# Patient Record
Sex: Female | Born: 1980 | Race: White | Hispanic: No | Smoking: Never smoker
Health system: Southern US, Community
[De-identification: ages and names within clinical notes are randomized; demographics above are authoritative.]

## PROBLEM LIST (undated history)

## (undated) ENCOUNTER — Emergency Department (HOSPITAL_COMMUNITY): Payer: Self-pay | Source: Home / Self Care

## (undated) DIAGNOSIS — G8929 Other chronic pain: Secondary | ICD-10-CM

## (undated) DIAGNOSIS — F329 Major depressive disorder, single episode, unspecified: Secondary | ICD-10-CM

## (undated) DIAGNOSIS — Z87442 Personal history of urinary calculi: Secondary | ICD-10-CM

## (undated) DIAGNOSIS — Z170 Estrogen receptor positive status [ER+]: Secondary | ICD-10-CM

## (undated) DIAGNOSIS — J45909 Unspecified asthma, uncomplicated: Secondary | ICD-10-CM

## (undated) DIAGNOSIS — F32A Depression, unspecified: Secondary | ICD-10-CM

## (undated) DIAGNOSIS — R519 Headache, unspecified: Secondary | ICD-10-CM

## (undated) DIAGNOSIS — K8689 Other specified diseases of pancreas: Secondary | ICD-10-CM

## (undated) DIAGNOSIS — M5432 Sciatica, left side: Secondary | ICD-10-CM

## (undated) DIAGNOSIS — F419 Anxiety disorder, unspecified: Secondary | ICD-10-CM

## (undated) DIAGNOSIS — Z973 Presence of spectacles and contact lenses: Secondary | ICD-10-CM

## (undated) DIAGNOSIS — D649 Anemia, unspecified: Secondary | ICD-10-CM

## (undated) DIAGNOSIS — Z8489 Family history of other specified conditions: Secondary | ICD-10-CM

## (undated) DIAGNOSIS — G4700 Insomnia, unspecified: Secondary | ICD-10-CM

## (undated) DIAGNOSIS — I10 Essential (primary) hypertension: Secondary | ICD-10-CM

## (undated) DIAGNOSIS — C50411 Malignant neoplasm of upper-outer quadrant of right female breast: Secondary | ICD-10-CM

## (undated) DIAGNOSIS — M549 Dorsalgia, unspecified: Secondary | ICD-10-CM

## (undated) DIAGNOSIS — Z803 Family history of malignant neoplasm of breast: Secondary | ICD-10-CM

## (undated) DIAGNOSIS — Z8052 Family history of malignant neoplasm of bladder: Secondary | ICD-10-CM

## (undated) DIAGNOSIS — T8859XA Other complications of anesthesia, initial encounter: Secondary | ICD-10-CM

## (undated) DIAGNOSIS — Z800 Family history of malignant neoplasm of digestive organs: Secondary | ICD-10-CM

## (undated) DIAGNOSIS — K760 Fatty (change of) liver, not elsewhere classified: Secondary | ICD-10-CM

## (undated) HISTORY — PX: ABDOMINAL HYSTERECTOMY: SHX81

## (undated) HISTORY — DX: Family history of malignant neoplasm of digestive organs: Z80.0

## (undated) HISTORY — PX: CHOLECYSTECTOMY: SHX55

## (undated) HISTORY — DX: Family history of malignant neoplasm of bladder: Z80.52

## (undated) HISTORY — PX: APPENDECTOMY: SHX54

## (undated) HISTORY — PX: OTHER SURGICAL HISTORY: SHX169

## (undated) HISTORY — PX: WISDOM TOOTH EXTRACTION: SHX21

## (undated) HISTORY — PX: DILATION AND CURETTAGE OF UTERUS: SHX78

## (undated) HISTORY — DX: Family history of malignant neoplasm of breast: Z80.3

## (undated) MED FILL — Goserelin Acetate Implant 3.6 MG: SUBCUTANEOUS | Qty: 3.6 | Status: AC

---
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
## 2001-08-29 ENCOUNTER — Other Ambulatory Visit: Admission: RE | Admit: 2001-08-29 | Discharge: 2001-08-29 | Payer: Self-pay | Admitting: Family Medicine

## 2001-10-17 HISTORY — PX: FINGER SURGERY: SHX640

## 2001-11-04 ENCOUNTER — Encounter: Payer: Self-pay | Admitting: Emergency Medicine

## 2001-11-04 ENCOUNTER — Inpatient Hospital Stay (HOSPITAL_COMMUNITY): Admission: AC | Admit: 2001-11-04 | Discharge: 2001-11-05 | Payer: Self-pay

## 2004-02-06 ENCOUNTER — Other Ambulatory Visit: Payer: Self-pay

## 2004-08-01 ENCOUNTER — Emergency Department: Payer: Self-pay | Admitting: Emergency Medicine

## 2004-08-05 ENCOUNTER — Observation Stay (HOSPITAL_COMMUNITY): Admission: RE | Admit: 2004-08-05 | Discharge: 2004-08-06 | Payer: Self-pay | Admitting: Gynecology

## 2004-08-06 ENCOUNTER — Encounter (INDEPENDENT_AMBULATORY_CARE_PROVIDER_SITE_OTHER): Payer: Self-pay | Admitting: *Deleted

## 2004-08-29 ENCOUNTER — Emergency Department: Payer: Self-pay | Admitting: Emergency Medicine

## 2004-11-10 ENCOUNTER — Emergency Department: Payer: Self-pay | Admitting: Emergency Medicine

## 2004-11-16 ENCOUNTER — Ambulatory Visit: Payer: Self-pay

## 2004-11-17 ENCOUNTER — Ambulatory Visit: Payer: Self-pay | Admitting: Emergency Medicine

## 2005-02-14 ENCOUNTER — Emergency Department: Payer: Self-pay | Admitting: Emergency Medicine

## 2005-05-03 ENCOUNTER — Emergency Department: Payer: Self-pay | Admitting: Emergency Medicine

## 2005-05-13 ENCOUNTER — Ambulatory Visit (HOSPITAL_COMMUNITY): Admission: RE | Admit: 2005-05-13 | Discharge: 2005-05-13 | Payer: Self-pay | Admitting: Gynecology

## 2005-05-13 ENCOUNTER — Encounter (INDEPENDENT_AMBULATORY_CARE_PROVIDER_SITE_OTHER): Payer: Self-pay | Admitting: Specialist

## 2005-05-23 ENCOUNTER — Inpatient Hospital Stay (HOSPITAL_COMMUNITY): Admission: AD | Admit: 2005-05-23 | Discharge: 2005-05-26 | Payer: Self-pay | Admitting: Gynecology

## 2005-06-07 ENCOUNTER — Encounter (INDEPENDENT_AMBULATORY_CARE_PROVIDER_SITE_OTHER): Payer: Self-pay | Admitting: Specialist

## 2005-06-07 ENCOUNTER — Inpatient Hospital Stay (HOSPITAL_COMMUNITY): Admission: RE | Admit: 2005-06-07 | Discharge: 2005-06-10 | Payer: Self-pay | Admitting: Gynecology

## 2005-06-23 ENCOUNTER — Ambulatory Visit (HOSPITAL_COMMUNITY): Admission: RE | Admit: 2005-06-23 | Discharge: 2005-06-23 | Payer: Self-pay | Admitting: Gynecology

## 2005-09-27 ENCOUNTER — Ambulatory Visit (HOSPITAL_COMMUNITY): Admission: RE | Admit: 2005-09-27 | Discharge: 2005-09-27 | Payer: Self-pay | Admitting: Gynecology

## 2005-12-22 ENCOUNTER — Ambulatory Visit: Payer: Self-pay | Admitting: Obstetrics & Gynecology

## 2005-12-29 ENCOUNTER — Ambulatory Visit: Payer: Self-pay | Admitting: Obstetrics & Gynecology

## 2005-12-29 ENCOUNTER — Inpatient Hospital Stay (HOSPITAL_COMMUNITY): Admission: AD | Admit: 2005-12-29 | Discharge: 2005-12-29 | Payer: Self-pay | Admitting: Obstetrics & Gynecology

## 2006-01-05 ENCOUNTER — Ambulatory Visit: Payer: Self-pay | Admitting: Obstetrics & Gynecology

## 2006-01-05 ENCOUNTER — Inpatient Hospital Stay (HOSPITAL_COMMUNITY): Admission: AD | Admit: 2006-01-05 | Discharge: 2006-01-05 | Payer: Self-pay | Admitting: Family Medicine

## 2006-01-12 ENCOUNTER — Ambulatory Visit: Payer: Self-pay | Admitting: Obstetrics & Gynecology

## 2006-01-12 ENCOUNTER — Inpatient Hospital Stay (HOSPITAL_COMMUNITY): Admission: AD | Admit: 2006-01-12 | Discharge: 2006-01-12 | Payer: Self-pay | Admitting: Family Medicine

## 2006-01-12 ENCOUNTER — Ambulatory Visit: Payer: Self-pay | Admitting: Obstetrics and Gynecology

## 2006-01-14 ENCOUNTER — Ambulatory Visit: Payer: Self-pay | Admitting: Family Medicine

## 2006-01-14 ENCOUNTER — Inpatient Hospital Stay (HOSPITAL_COMMUNITY): Admission: AD | Admit: 2006-01-14 | Discharge: 2006-01-14 | Payer: Self-pay | Admitting: Gynecology

## 2006-01-19 ENCOUNTER — Ambulatory Visit: Payer: Self-pay | Admitting: Family Medicine

## 2006-01-26 ENCOUNTER — Ambulatory Visit: Payer: Self-pay | Admitting: Family Medicine

## 2006-01-27 ENCOUNTER — Ambulatory Visit (HOSPITAL_COMMUNITY): Admission: RE | Admit: 2006-01-27 | Discharge: 2006-01-27 | Payer: Self-pay | Admitting: Obstetrics & Gynecology

## 2006-01-27 ENCOUNTER — Ambulatory Visit: Payer: Self-pay | Admitting: Obstetrics & Gynecology

## 2006-01-28 ENCOUNTER — Ambulatory Visit: Payer: Self-pay | Admitting: Certified Nurse Midwife

## 2006-01-28 ENCOUNTER — Inpatient Hospital Stay (HOSPITAL_COMMUNITY): Admission: AD | Admit: 2006-01-28 | Discharge: 2006-01-28 | Payer: Self-pay | Admitting: Obstetrics and Gynecology

## 2006-01-30 ENCOUNTER — Ambulatory Visit: Payer: Self-pay | Admitting: Certified Nurse Midwife

## 2006-01-30 ENCOUNTER — Observation Stay (HOSPITAL_COMMUNITY): Admission: AD | Admit: 2006-01-30 | Discharge: 2006-01-31 | Payer: Self-pay | Admitting: Family Medicine

## 2006-02-02 ENCOUNTER — Ambulatory Visit: Payer: Self-pay | Admitting: Obstetrics & Gynecology

## 2006-02-09 ENCOUNTER — Ambulatory Visit: Payer: Self-pay | Admitting: Obstetrics & Gynecology

## 2006-02-09 ENCOUNTER — Inpatient Hospital Stay (HOSPITAL_COMMUNITY): Admission: AD | Admit: 2006-02-09 | Discharge: 2006-02-12 | Payer: Self-pay | Admitting: Obstetrics & Gynecology

## 2006-02-10 ENCOUNTER — Encounter (INDEPENDENT_AMBULATORY_CARE_PROVIDER_SITE_OTHER): Payer: Self-pay | Admitting: Specialist

## 2006-02-10 ENCOUNTER — Ambulatory Visit: Payer: Self-pay | Admitting: Obstetrics & Gynecology

## 2006-03-23 ENCOUNTER — Ambulatory Visit: Payer: Self-pay | Admitting: Obstetrics & Gynecology

## 2006-07-16 ENCOUNTER — Other Ambulatory Visit: Payer: Self-pay

## 2006-07-16 ENCOUNTER — Emergency Department: Payer: Self-pay | Admitting: Unknown Physician Specialty

## 2006-07-18 ENCOUNTER — Ambulatory Visit: Payer: Self-pay | Admitting: Family Medicine

## 2006-08-03 ENCOUNTER — Ambulatory Visit: Payer: Self-pay | Admitting: Obstetrics & Gynecology

## 2006-08-03 ENCOUNTER — Encounter: Payer: Self-pay | Admitting: Obstetrics & Gynecology

## 2006-08-23 ENCOUNTER — Ambulatory Visit: Payer: Self-pay | Admitting: *Deleted

## 2007-03-13 ENCOUNTER — Ambulatory Visit: Payer: Self-pay | Admitting: Family Medicine

## 2007-04-17 ENCOUNTER — Ambulatory Visit: Payer: Self-pay | Admitting: Family Medicine

## 2007-08-03 ENCOUNTER — Ambulatory Visit: Payer: Self-pay | Admitting: Family Medicine

## 2007-08-09 ENCOUNTER — Ambulatory Visit: Payer: Self-pay | Admitting: Obstetrics & Gynecology

## 2007-08-09 ENCOUNTER — Encounter: Payer: Self-pay | Admitting: Obstetrics & Gynecology

## 2007-08-16 ENCOUNTER — Ambulatory Visit: Payer: Self-pay | Admitting: Obstetrics & Gynecology

## 2007-08-21 ENCOUNTER — Ambulatory Visit (HOSPITAL_COMMUNITY): Admission: RE | Admit: 2007-08-21 | Discharge: 2007-08-21 | Payer: Self-pay | Admitting: Obstetrics & Gynecology

## 2007-08-21 ENCOUNTER — Ambulatory Visit: Payer: Self-pay | Admitting: Obstetrics & Gynecology

## 2007-09-26 ENCOUNTER — Ambulatory Visit: Payer: Self-pay | Admitting: Obstetrics & Gynecology

## 2007-11-27 ENCOUNTER — Inpatient Hospital Stay (HOSPITAL_COMMUNITY): Admission: RE | Admit: 2007-11-27 | Discharge: 2007-11-28 | Payer: Self-pay | Admitting: Obstetrics & Gynecology

## 2007-11-27 ENCOUNTER — Ambulatory Visit: Payer: Self-pay | Admitting: Obstetrics & Gynecology

## 2007-11-27 ENCOUNTER — Encounter: Payer: Self-pay | Admitting: Obstetrics & Gynecology

## 2007-12-12 ENCOUNTER — Ambulatory Visit: Payer: Self-pay | Admitting: Obstetrics & Gynecology

## 2008-05-05 ENCOUNTER — Inpatient Hospital Stay: Payer: Self-pay | Admitting: Unknown Physician Specialty

## 2008-06-27 ENCOUNTER — Emergency Department: Payer: Self-pay | Admitting: Internal Medicine

## 2010-01-15 ENCOUNTER — Ambulatory Visit: Payer: Self-pay | Admitting: Family Medicine

## 2011-03-01 NOTE — Discharge Summary (Signed)
[name], [name]             ACCOUNT NO.:  [id number]   MEDICAL RECORD NO.:  [id number]          PATIENT TYPE:  INP   LOCATION:  9317                          FACILITY:  [facility]   PHYSICIAN:  Norton Blizzard, MD    DATE OF BIRTH:  1981/08/05   DATE OF ADMISSION:  11/27/2007  DATE OF DISCHARGE:  11/28/2007                               DISCHARGE SUMMARY   ADMISSION DIAGNOSES:  1. Chronic pelvic pain.  2. Endometriosis.  3. Menometrorrhagia.   DISCHARGE DIAGNOSES:  1. Status post total abdominal hysterectomy.  2. Right salpingo-oophorectomy.   PERTINENT LABS:  Preoperative hemoglobin was 14.  Postoperative  hemoglobin was 10, which was stable when remeasured during postoperative  day #1.   HOSPITAL COURSE:  The patient is a 30 year old para 2, who has a long  history of chronic pelvic pain, endometriosis and menometrorrhagia.  The  patient had been treated with different modalities in the past,  including endometrial ablation, which did not alleviate her symptoms.  She desired definitive surgical management.  She underwent a total  abdominal hysterectomy, right salpingo-oophorectomy on November 27, 2007.  For further details of this procedure, please refer to separate  dictated operative report.  The procedure was largely uncomplicated, and  the patient underwent routine postoperative advances.  By postoperative  day #1 she had a stable hemoglobin.  She was ambulating without  difficulty, tolerating a regular diet.  Pain was controlled on oral pain  medications, and she was passing flatus.  The patient showed no signs or  symptoms or anemia or other postoperative complications, and desired to  be discharged to home.  She was then discharged to home in stable  condition.   DISCHARGE INSTRUCTIONS:  Please call the clinic at [facility] or come  back to the emergency room for any fevers, chills, sweats, abnormal  bleeding, abnormal incisional drainage, incisional redness, or  any other  concerns.  The patient can take showers and she was also to increase  activity slowly.  No lifting anything heavier than 10 pounds for the  next 6 weeks, and nothing in vagina for the next 6 weeks.  The patient  is to follow up at [facility] in 2 weeks for her postoperative  evaluation, and she is to come to the emergency room or call [facility] clinic for any concerns prior to this appointment.      Norton Blizzard, MD  Electronically Signed     UAD/MEDQ  D:  11/28/2007  T:  11/29/2007  Job:  [id number]

## 2011-03-01 NOTE — Assessment & Plan Note (Signed)
NAMEEARLIE, [name]             ACCOUNT NO.:  [id number]   MEDICAL RECORD NO.:  [id number]          PATIENT TYPE:  POB   LOCATION:  [facility] at [facility]         FACILITY:  [facility]   PHYSICIAN:  Tinnie Gens, MD        DATE OF BIRTH:  02-Mar-1981   DATE OF SERVICE:  03/13/2007                                  CLINIC NOTE   CHIEF COMPLAINT:  Abnormal bleeding.   HISTORY OF PRESENT ILLNESS:  The patient is a 30 year old who is  approximately one year post-partum who has had some ongoing problems  with hypertension, tachycardia. She also had what looked to be some mild  hyperthyroidism on labs done here in October. She states that she has  had followup to these labs by her primary care doctor as well as an  endocrinologist that were both normal, probably last time it was checked  was in January of this year. Approximately six months ago, she started  getting a cycle back. She had a cycle every 2 to 3 months for several  months until this month when she had two cycles. First one lasted 8 days  with heavy amount of bleeding and then had another cycle toward the  latter part of the month approximately two weeks later that lasted 6  days with heavy bleeding around that cycle as well. She reports this is  not unlike after the birth of her previous child and she had abnormal  cycles and had to be put on birth control pills to control that. She  otherwise is without complaints. She continues to nurse her baby, but  only at night.   PHYSICAL EXAMINATION:  Her pulse is 107, her blood pressure is 133/86,  weight is 197, which is up from 190 which is up from 185 from the visit  prior to that. The patient is well-developed, well-nourished female in  no acute distress. She has had too much sun recently.  ABDOMEN: Soft and nontender, nondistended.  GU: Normal external female genitalia. Vagina is pink and rugated. Cervix  is nulliparous without lesions. Uterus is small, anteverted and no  adnexal  masses or tenderness noted.   IMPRESSION:  Menorrhagia, unclear etiology.   PLAN:  1. Check TSH, CBC.  2. Pelvic sonogram.  3. Start Orthostat 1/35 may start today. The patient is status post      BTL in the past so ensuring no pregnancy is not an issue. Will see      her back in approximately six weeks with review of all information      and see how the birth control is maintaining her cycle. Have      additionally given her a prescription for Naprosyn. Next yearly is      due in October of 2008.           ______________________________  Tinnie Gens, MD     TP/MEDQ  D:  03/13/2007  T:  03/13/2007  Job:  [id number]

## 2011-03-01 NOTE — Assessment & Plan Note (Signed)
NAMEHILMA, [name]             ACCOUNT NO.:  [id number]   MEDICAL RECORD NO.:  [id number]          PATIENT TYPE:  POB   LOCATION:  [facility] at [facility]         FACILITY:  [facility]   PHYSICIAN:  Elsie Lincoln, MD      DATE OF BIRTH:  1981-09-26   DATE OF SERVICE:                                  CLINIC NOTE   This is a 30 year old female who presents for endometrial biopsy.  She  has dysfunctional uterine bleeding that is unresponsive to OCPs and has  pelvic pain.  She is scheduled for endometrial ablation to hopefully  control her dysfunctional uterine bleeding and hopefully control some of  the cramping she experiences with it.  She is for endometrial biopsy  today.  She is also complaining of vaginal discharge.  GC and chlamydia  culture and wet prep were also sent.  The uterus sounded to 9 cm.  Also  of note, she had a negative UCG prior to the procedure.  Also informed  consent was obtained prior to the procedure.  Again, the uterus sounded  to 9 cm, and two passes were taken.  We talked about the ablation and  what will take place on August 21, 2007 when she has her procedure.  All questions were answered.  She is to continue her birth control pills  up until then.  She was given a pack of Femcon FE samples, and then she  was also given a prescription for Darvocet for pain.  If this is not  working, the next step would be hysterectomy.           ______________________________  Elsie Lincoln, MD     KL/MEDQ  D:  08/16/2007  T:  08/16/2007  Job:  [id number]

## 2011-03-01 NOTE — Assessment & Plan Note (Signed)
NAMEMOON, BUDDE             ACCOUNT NO.:  [id number]   MEDICAL RECORD NO.:  [id number]          PATIENT TYPE:  POB   LOCATION:  [facility]         FACILITY:  [facility]   PHYSICIAN:  Johnella Moloney, MD        DATE OF BIRTH:  01-Dec-1980   DATE OF SERVICE:  09/26/2007                                  CLINIC NOTE   HISTORY OF PRESENT ILLNESS:  The patient is a 30 year old para 2 with  long history of menometrorrhagia, endometriosis and pelvic pain who was  status post hydrotherm endometrial ablation on August 21, 2007, who  presents for followup.  The patient has had a long history of  menometrorrhagia, unresponsive to oral contraceptive pills.  She has  also had multiple laparoscopies in the past and was diagnosed with  endometriosis.  The patient reports that she had wanted to have a  hysterectomy for many years, but was told to try other medical  modalities or less invasive surgery to see if this could help with her  menometrorrhagia and her pain.  At this visit, patient reports that  since her hydrotherm ablation, which was done by Dr. Penne Lash on November  4, she had no bleeding for 2 weeks, but then had a heavy period for 1  week during which she was using 6 or more super plus tampons, and since  then she has had bleeding off and on, ranging from spotting to very  heavy bleeding.  The patient also notes that she has been having yellow-  orange discharge that is malodorous since endometrial ablation, and does  report persistent cramping that has not gotten better since the  procedure.  Today, the patient is requesting total abdominal  hysterectomy and a bilateral salpingo-oophorectomy.  She says that she  has tried these minimal invasive surgeries, and they are not working for  her, and she cannot live with this pain any longer, and wants definitive  surgical management.   PAST MEDICAL HISTORY:  The patient has a history of tachycardia and high  blood pressure for which  she is being followed by a cardiologist.  She  also has history of depression and chronic pelvic pain.   PAST SURGICAL HISTORY:  The patient has had 2 Caesarean sections in the  past.  The last Caesarean section, also, bilateral tubal ligation was  performed at the same time.  She has had laparoscopies in the past, and  D&Cs for her menorrhagia, and her last surgery was the hydrotherm  ablation and hysteroscopy performed by Dr. Penne Lash on November 4.   MEDICATIONS:  The patient is currently taking Darvocet as needed,  multivitamin, Toprol 1 tablet daily, and Unisom as needed for sleep.   ALLERGIES:  LATEX and PENICILLIN.  The patient reports a history of  hives with PENICILLIN and LATEX.   SOCIAL HISTORY:  The patient lives with her children.  She denies any  abuse, denies any current habits.   FAMILY HISTORY:  Noncontributory.   REVIEW OF SYSTEMS:  The patient endorses chronic pelvic pain and  menometrorrhagia.  She also notes that she is unable to sleep some  nights which is helped by her Unisom.  PHYSICAL EXAMINATION:  VITALS:  Pulse 86, blood pressure 123/90, weight  194.5 pounds, height 5 feet 6 inches.  IN GENERAL:  No apparent distress.  LUNGS:  Clear to auscultation bilaterally.  HEART:  Regular rate and rhythm.  ABDOMEN:  Nontender and nondistended.  Well-healed Pfannenstiel incision  and laparoscopy incision scars.  PELVIC EXAMINATION:  Normal external female genitalia.  Pink, well-  rugated vagina with thick yellow discharge noted in the vaginal vault.  No active discharge noted from the cervix.  Wet prep and GC chlamydia  cultures were obtained.  No lesions noted.  No cervical motion  tenderness or uterine tenderness, bilateral adnexal tenderness palpated.  No masses palpated.   ASSESSMENT AND PLAN:  The patient is a 30 year old para 2 who has a long  history of menometrorrhagia and pelvic pain.  The patient desires  definitive surgical management.  I talked to  the patient about risks of  total abdominal hysterectomy, bilateral salpingo-oophorectomy, including  the risks of surgery or bleeding, infection, injury to surrounding  organs, and need for additional procedures.  Also talked to the patient  about being menopausal at such a young age, and the effect on her bone  health, and the need for taking hormone replacement therapy, and the  risks of hormone replacement therapy.  The patient just continues to say  that she cannot live with this pain any longer, and she wants everything  taken out.  The patient does not want to try any further medications or  any other surgical procedures apart from a definite and surgical  procedure.  She will be booked for a total abdominal hysterectomy,  bilateral salpingo-oophorectomy.  This surgery will be done with Dr.  Elsie Lincoln who the patient has been seeing more recently.  I wanted  her to be involved in the surgery.  Given her prior history, she is not  a good candidate for laparoscopy, as there is a chance she will have  some adhesive disease given her prior surgeries, so she will be booked  for an open procedure.  The patient does verbalize understanding of the  plan, and will be contacted by a surgical scheduler for her surgery date  and preop instructions.  Given her history of tachycardia and  hypertension, the patient was told to follow up with cardiologist for  preoperative examination and surgical clearance.           ______________________________  Johnella Moloney, MD     UD/MEDQ  D:  09/26/2007  T:  09/26/2007  Job:  [id number]

## 2011-03-01 NOTE — Assessment & Plan Note (Signed)
[name], SIRIANNI             ACCOUNT NO.:  [id number]   MEDICAL RECORD NO.:  [id number]          PATIENT TYPE:  POB   LOCATION:  [facility] at [facility]         FACILITY:  [facility]   PHYSICIAN:  Elsie Lincoln, MD      DATE OF BIRTH:  July 23, 1981   DATE OF SERVICE:  08/09/2007                                  CLINIC NOTE   The patient is a 30 year old female who presents for her physical, also  follow-up of irregular bleeding.  She has been on Ortho-Cept for almost  seven packages that and she is still having breakthrough bleeding and  cramping.  She takes Darvocet p.r.n. Of note, the patient was schedule  for hysterectomy before her last pregnancy. She showed up for her  hysterectomy and was diagnosed as pregnant.  She had a second C-section  and then was placed on the Ortho-Cept.  She also has had a BTL.  She is  completely done with childbearing. She was told she had endometriosis.  In looking at pictures of her laparoscopy and it does not appear that  her pelvis is overly ridden with mass implants of endometriosis.  However, I was not present during the procedure.  She has also had PID  in the past. However, again I do not see obvious adhesions, I did do her  C-section and I do not remember there being a tremendous amount of  adhesions again.  At this point, I can say she has failed medical  management and I do not think she is a candidate for Depo given that she  already has irregular bleeding.  She has had transvaginal ultrasound  which revealed normal uterus and normal ovaries with small centimeter  simple cyst her ovaries. We talked about hysterectomy versus endometrial  ablation just to try to get rid of the bleeding which could hopefully  help her cramping. Do not think a total abdominal hysterectomy bilateral  salpingo oophorectomy would be prudent at this time given that she is so  young and removing her ovaries would many years of estrogenation. The  patient agrees that we  will try most conservative measures first with  just endometrial ablation and stopping the chronic bleeding and  hopefully and some of her cramping. Also of note, she has been having  some bowel issues and had 3 weeks of diarrhea and was placed on fiber  pills. Now, she has constipation.  She could have an irritable bowel  syndrome and again this needs to be carefully investigated before she  undergoes any major surgery.  She also has a problem with depression,  but this seems well controlled with Effexor. She claims she is very  tired. However, she does have a hemoglobin done this month as 13.7; her  TSH is also normal this month.   PAST MEDICAL HISTORY:  No changes.  However, she has had some borderline  hypertension issues. Her blood pressure today was 142/78 and recheck  128/81.   PAST SURGICAL HISTORY:  No changes.   GYN HISTORY:  As above.   REVIEW OF SYMPTOMS:  As above.   Her blood pressure 141/78, recheck 120/81, weight 193, height 47feet 6  inches, pulse 92.  MEDICATIONS:  1. Darvocet N 100 p.r.n.  2. multivitamin.  3. Toprol.  4. Effexor.  5. Unisom.  6. Ortho-Cept.   ALLERGIES:  None.   GENERAL:  Well-nourished, well-developed no apparent distress.  HEENT:  Normocephalic, atraumatic with braces on her teeth. Thyroid no  masses.  LUNGS:  Clear auscultation bilaterally.  HEART:  Regular rate and rhythm.  BREASTS:  No masses, no lymphadenopathy.  No nipple discharge or skin  changes.  ABDOMEN:  Soft, nontender.  No rebound, no guarding.  No organomegaly.  No hernia.  GENITALIA:  Tanner five.  There is some small folliculitis on her  buttocks. Vagina pink, normal rugae, bladder and urethra nontender.  Cervix nulliparous, closed.  Uterus anterior, very small adnexa, no  masses, nontender. EXTREMITIES:  Nontender.   ASSESSMENT/PLAN:  30 year old female with chronic pelvic pain of unknown  etiology and menometrorrhagia  that has failed  medical management.  1.  Pap smear and cultures today.  2. Endometrial biopsy, before endometrial ablation.  3. Come back next week for endometrial biopsy, premedicated with 600      mg of Motrin.  4. Prescription for Darvocet for pain.           ______________________________  Elsie Lincoln, MD     KL/MEDQ  D:  08/09/2007  T:  08/09/2007  Job:  [id number]

## 2011-03-01 NOTE — Op Note (Signed)
[name], [name]             ACCOUNT NO.:  [id number]   MEDICAL RECORD NO.:  [id number]          PATIENT TYPE:  AMB   LOCATION:  [facility]                           FACILITY:  [facility]   PHYSICIAN:  Lesly Dukes, M.D. DATE OF BIRTH:  Jun 22, 1981   DATE OF PROCEDURE:  09/20/2007  DATE OF DISCHARGE:  08/21/2007                               OPERATIVE REPORT   PREOPERATIVE DIAGNOSIS:  30 year old para 2 with metromenorrhagia,  unresponsive to medication.   POSTOPERATIVE DIAGNOSIS:  30 year old para 2 with metromenorrhagia,  unresponsive to medication.   OPERATION PERFORMED:  Hysteroscopy and hydrothermal ablation.   SURGEON:  Lesly Dukes, M.D.   ANESTHESIA:  General.   FINDINGS:  Thick endometrium preprocedure.   SPECIMENS:  Endometrial curettings to pathology.   ESTIMATED BLOOD LOSS:  Minimal.   COMPLICATIONS:  None.   DESCRIPTION OF PROCEDURE:  After informed consent was obtained, the  patient was taken to the operating room where general anesthesia was  found to be adequate.  The patient was placed in dorsal lithotomy  position, prepared and draped in normal sterile fashion.  The bladder  was emptied.  A bivalved speculum was placed in the patient's vagina and  the anterior lip of the cervix was grasped with a single toothed  tenaculum.  The cervix was gently dilated with  Hegar dilators to #8.  Gentle sharp curettage was performed for a specimen.  Hysteroscopy was  performed and then the hydrothermal ablation was performed following the  specifications of the manufacturer.  There was a 10 minute burning time  and then a cool down.  The patient tolerated the procedure well.  There  was no extravasation of fluid from the cervix.  The patient tolerated  the procedure well.  Sponge, lap, needle and instruments were all  correct times two.  Patient went to recovery room in stable condition.      Lesly Dukes, M.D.  Electronically Signed     KHL/MEDQ  D:   09/20/2007  T:  09/21/2007  Job:  [id number]

## 2011-03-01 NOTE — Op Note (Signed)
NAMETRISTIAN, [name]             ACCOUNT NO.:  [id number]   MEDICAL RECORD NO.:  [id number]          PATIENT TYPE:  INP   LOCATION:                                FACILITY:  [facility]   PHYSICIAN:  Norton Blizzard, MD    DATE OF BIRTH:  1981-01-26   DATE OF PROCEDURE:  11/28/2007  DATE OF DISCHARGE:                               OPERATIVE REPORT   PREOPERATIVE DIAGNOSIS:  Menometrorrhagia, endometriosis, chronic pelvic  pain.   POSTOPERATIVE DIAGNOSIS:  Menometrorrhagia, endometriosis, chronic  pelvic pain.   PROCEDURE:  Total abdominal hysterectomy, right salpingo-oophorectomy.   SURGEON:  Norton Blizzard, MD   ASSISTANT:  Javier Glazier. Okey Dupre, M.D.   ANESTHESIA:  General, and local anesthesia in the form of 0.25% Marcaine  10 cc injected in the incision at the end of surgery.   IV FLUIDS:  3100 cc of lactated Ringer's.   ESTIMATED BLOOD LOSS:  300 cc.   URINE OUTPUT:  200 cc of clear yellow urine at the end of the procedure.   INDICATION:  The patient is a 30 year old, para 2, with a long history  of menometrorrhagia, endometriosis, and chronic pelvic pain.  The  patient has had multiple laparoscopies in the past and had been  unresponsive to hormonal therapy.  She recently underwent hydrothermic  endometrial ablation on August 21, 2007, and at her follow up visit,  the patient said that this did not help her menometrorrhagia and her  pain and wanted definitive surgical management.  The patient was  counseled that given her multiple history of surgeries, including 2  cesarean sections, abdominal hysterectomy was recommended.  The patient  initially wanted a bilateral salpingo-oophorectomy.  However, when she  was counseled regarding the risk of early menopause and the risk of  osteoporosis, the patient did agree to undergo a unilateral salpingo-  oophorectomy in order to keep her endogenous hormone supply intact.  The  risks of surgery were explained to the patient, and written  informed  consent was obtained.   FINDINGS:  Small anteverted uterus, endometriotic lesions noted on the  right ovary, normal left adnexa, thick adhesions of bladder to the lower  uterine segment.   SPECIMENS:  Uterus, cervix, right fallopian tube and ovary.   DISPOSITION OF SPECIMENS:  Pathology.   COMPLICATIONS:  None.   PROCEDURE DETAILS:  The patient was taken to the operating room where  general anesthesia was administered and found to be adequate.  She was  then placed in a dorsal supine position, and prepped and draped in a  sterile manner.  A Foley catheter was inserted into the patient's  bladder.  Of note, the patient did receive preoperative antibiotics 30  minutes prior to incision.  Attention was then turned to the patient's  abdomen, where a Pfannensteil incision was made over her preexisting  scar using a scalpel.  The scalpel was used to make the incision up to  the level of the fascia.  The fascia was incised in midline, and this  incision was extended bilaterally using cautery.  Kocher clamps were  then applied to the  superior aspect of the incision and the rectus  muscles were dissected bluntly and sharply.  A similar process was  carried out on the inferior aspect of the incision.  The rectus muscles  were then separated bluntly in the midline, and the peritoneum was  entered bluntly.  This peritoneal incision was extended superiorly and  inferiorly with good visualization of the bladder and bowel.  At this  point, it was noted that the uterus was small and fairly mobile; an  Teacher, early years/pre was then placed at this point, and the bowel was packed  away with laparotomy sponges.  Attention was then turned to the uterus  where Kelly clamps were placed on the cornua for retraction.  The round  ligaments were identified bilaterally, suture ligated and transected  with cautery allowing access into the broad ligament.  Of note, all  sutures used during the surgery are  0 Vicryl unless noted otherwise.  The anterior leaf of the broad ligament was then incised along the  bladder reflection to the midline from both sides.  It was noted that  the bladder was tightly adherent to the lower uterine segment, but this  was gently dissected off the lower uterine segment and the cervix using  blunt and sharp methods.  At this point it was also noted that there  were some endometriotic lesions on the patient's right ovary, so the  decision was made to proceed with right salpingo-oophorectomy.  Given  endometriotic lesions on the right side, the infundibulopelvic ligament  on the right was then doubly clamped, transected and suture-ligated.  Hemostasis was visualized. Of note on the left side, the ovary appeared  normal was kept in place, and so the utero-ovarian ligament on the left  side was doubly clamped, transected, and suture ligated. The uterine  arteries were skeletonized bilaterally, clamped with Heaney clamps,  transected and suture-ligated.  Hemostasis was assured.  The uterosacral  and cardinal ligaments were clamped on both sides, transected, and  suture ligated in a similar fashion.  The cervix and the uterus were  amputated using a Mayo scissors and the vaginal cuff angles were closed  with figure-of-eight stitches, and transfixed to the ipsilateral,  cardinal, and uterosacral ligaments.  The remainder of the vaginal cuff  was closed with a series of interrupted figure-of-eight sutures.  Hemostasis was assured.  The pelvis was irrigated copiously with warm  normal saline.  At this point, it was noted that there was some diffuse  superficial bleeding from the bed of the bladder where it was dissected  from the lower uterine segment.  Given that it was diffuse scant  bleeding which could not be ameliorated with coagulation or stitches,  the decision was made to put FloSeal on this raw surface.  FloSeal was  then prepared and applied to the raw surface,  and pressure was applied  in 2 minutes.  This produced excellent hemostasis.  Laparotomy sponges,  retractor and instruments were then removed from the abdomen.  The  peritoneum  and rectus muscles were reapproximated with a running  stitich. The fascia was closed with running looped 1-0 PDS stitch.  The  subcutaneous tissues were reapproximated with 2-0 plain gut running  stitch, and the skin was closed with 3-0 Vicryl in a subcuticular  stitch.  The patient tolerated the procedure well.  Sponge, needle, and  instrument counts were correct x2.  The patient was taken to the PACU,  extubated, awake, and in stable condition.  Norton Blizzard, MD  Electronically Signed     UAD/MEDQ  D:  11/27/2007  T:  11/28/2007  Job:  [id number]

## 2011-03-04 NOTE — Assessment & Plan Note (Signed)
NAMEARWILDA, [name]             ACCOUNT NO.:  [id number]   MEDICAL RECORD NO.:  [id number]          PATIENT TYPE:  POB   LOCATION:  [facility]         FACILITY:  [facility]   PHYSICIAN:  Tinnie Gens, MD        DATE OF BIRTH:  1980/11/04   DATE OF SERVICE:  07/18/2006                                    CLINIC NOTE   CHIEF COMPLAINT:  Vaginal discharge.   HISTORY OF PRESENT ILLNESS:  The patient is a 30 year old gravida 3, para 2-  0-1-2, who comes in today with a vaginal discharge. She underwent cesarean  section and bilateral tubal ligation in April 2007. Her discharge has just  been over the last several days. It is yellow and heavy. Has some odor and  not much vaginal irritation. No difficulty with her urine. No fever and no  chills. She has been with the same man for the last 5 years. No abdominal  pain and nausea. No vomiting.   PHYSICAL EXAMINATION:  VITAL SIGNS:  Are as noted in the chart. Blood  pressure 139/90.  GENERAL:  A well developed, well nourished female in no acute distress.  ABDOMEN:  Soft, nontender, and nondistended.  GENITOURINARY:  Normal. The external genitalia has some abraded areas around  the posterior fourchette. Vagina is pink and rugae. There is thin, yellow  vaginal discharge, __________ , very watery in nature, seen in the vagina  and around the cervix. The cervix was nulliparous in appearance and without  lesion. The uterus was small, anteverted. There is some mild cervical motion  tenderness. There is no adnexal mass or tenderness.   IMPRESSION:  Vaginal discharge, unclear etiology.   PLAN:  1. Wet prep today.  2. Gonorrhea and Chlamydia testing.  3. Will call the patient back with results and treatment as indicated.           ______________________________  Tinnie Gens, MD     TP/MEDQ  D:  07/18/2006  T:  07/19/2006  Job:  [id number]

## 2011-03-04 NOTE — Op Note (Signed)
[name], [name]             ACCOUNT NO.:  [id number]   MEDICAL RECORD NO.:  [id number]          PATIENT TYPE:  INP   LOCATION:                                [facility]ACILITY[facility]   PHYSICIAN:  Angie B. Merlene Morse, MD  DATE OF BIRTH:  [birth year]   DATE OF PROCEDURE:  DATE OF DISCHARGE:  02/12/2006                                 OPERATIVE REPORT   PREOPERATIVE DIAGNOSES:  1.  Pregnancy-induced hypertension.  2.  Previous cesarean section.   POSTOPERATIVE DIAGNOSES:  1.  Pregnancy-induced hypertension.  2.  Previous cesarean section.   PROCEDURE:  Elective repeat low transverse cesarean section and bilateral  tubal ligation.   SURGEON:  Lesly Dukes, M.D.   ASSISTANTKaroline Caldwell B. Merlene Morse, M.D.   ANESTHESIA:  Spinal.   COMPLICATIONS:  None.   ESTIMATED BLOOD LOSS:  700 cc.   FLUIDS:  2500 cc.   URINE OUTPUT:  No urine in the bag at the end of the procedure.   INDICATIONS FOR PROCEDURE:  The patient is a 30 year old G2, P1 with PIH  [name].  She desires permanent  sterilization.   FINDINGS:  Female infant in cephalic presentation.  Pediatrician present at  delivery.  Apgars 8 and 9.  Cord pH 7.27.  Normal uterus, tubes, and  ovaries.   DESCRIPTION OF PROCEDURE:  The patient was taken to the operating room where  spinal anesthesia was obtained and found to be adequate.  She was then  prepped and draped in the normal sterile fashion in the dorsal supine  position with a leftward tilt.   A Pfannenstiel skin incision was then made with the scalpel and carried  through the underlying layer of fascia with the Bovie.  The fascia was  incised in the midline and extended laterally with the Mayo scissors.  The  inferior aspect of the fascial incision was grasped with a Kocher clamp,  elevated, and the rectus muscle was dissected off bluntly.  Attention was  then turned to the superior aspect of the incision which was similarly  grasped, tented up with Kocher clamps and the rectus muscle dissected off  bluntly.  The rectus muscle was then separated in the midline.  The  peritoneum was identified, tented up, and entered sharply with the  Metzenbaum scissors.  The peritoneal incision was then extended superiorly  and inferiorly, with good visualization of the bladder.  The bladder blade  was then inserted and the vesicouterine peritoneum identified, grasped with  the pickups, and entered sharply with the Metzenbaum scissors.  The incision  was extended with the bladder bladeand a bladder flap created digitally.  The bladder blade was then reinserted and the lower uterine segment incised  in a transverse fashion with the scalpel.  The uterine incision was then  extended laterally with bandage scissors.  The bladder blade was then  removed, and the infant's head was delivered atraumatically.  The mouth and  nose were suctioned with the bulb syringe.  The cord was clamped and cut,  and the infant was handed off to  the awaiting pediatrician.  Cord blood was  sent.  The placenta was then removed manually and the uterus exteriorized  and cleared of all clots and debris.  The uterine incision was repaired in a  running locked fashion.   The patient's right fallopian tube was then identified, grasped with a  Babcock clamp, and approximately 4 cm from the cornual region, a 3-cm  segment of tube was then ligated with a free tie x2 and excised.  Good  hemostasis was obtained with the Bovie.  The left fallopian tube was then  ligated and a 3-cm segment excised in a similar fashion and ligated x2.  Excellent hemostasis was obtained with the Bovie.  The uterus was returned  to the abdomen.  The gutters were cleared of all clots and debris.  The  fascia was reapproximated in a running fashion, and the skin was closed with  staples.   The patient tolerated the procedure well.  Sponge, lap, and needle counts  were correct x2.  1 g  of Ancef was given at cord clamp.  The patient was  taken to the recovery room in stable condition.           ______________________________  August Saucer Merlene Morse, MD     ABC/MEDQ  D:  02/09/2006  T:  02/10/2006  Job:  [id number]

## 2011-03-04 NOTE — Op Note (Signed)
[name], [name]             ACCOUNT NO.:  [id number]   MEDICAL RECORD NO.:  [id number]          PATIENT TYPE:  INP   LOCATION:  9319                          FACILITY:  [facility]   PHYSICIAN:  Ginger Carne, MD  DATE OF BIRTH:  01-25-1981   DATE OF PROCEDURE:  08/06/2004  DATE OF DISCHARGE:                                 OPERATIVE REPORT   PREOPERATIVE DIAGNOSES:  Subacute appendicitis.   POSTOPERATIVE DIAGNOSES:  Subacute appendicitis.   PROCEDURE:  Laparoscopic appendectomy.   SURGEON:  Ginger Carne, MD   ASSISTANT:  Burnadette Peter, MD   ESTIMATED BLOOD LOSS:  Negligible.   COMPLICATIONS:  None immediate.   ANESTHESIA:  General.   SPECIMENS:  Appendix.   FINDINGS:  External genitalia, vulva and vagina were normal.  Cervix smooth  without erosions or lesions. Laparoscopic evaluation revealed minimal  adhesive disease from previous cesarean section on the anterior uterine  wall.  Both tubes and ovaries were within normal limits, no evidence of  endometriosis or pelvic inflammatory disease.  No evidence of femoral,  inguinal or obturator hernias. Appendix was erythematous and edematous but  not of the acute form. Upper abdomen normal, large and small bowel grossly  normal.   DESCRIPTION OF PROCEDURE:  The patient was prepped and draped in the usual  fashion and placed in lithotomy position. Betadine solution used for  antiseptic and the patient was catheterized prior to the procedure. After  adequate general anesthesia, a tenaculum was placed on the anterior lip of  the cervix and the Hickman tenaculum placed in the endocervical canal. A  vertical infraumbilical incision was made and the Veress needle placed in  the abdomen, opening and closing pressures were 10-15 mmHg.  __________  release trocar placed in the same incision, laparoscope placed in trocar  sleeve.  A 5 mm port was made under direct visualization in the left lower  quadrant and similarly in  the left hypogastric region.   After inspecting the pelvic and abdominal contents, the appendix was  grasped. The mesoappendix was bipolar cauterized and cut at the base.  Bleeding points hemostatically checked. 2-0 Vicryl loop ties were placed at  the base and one 7-8 mm above the first two. The appendix was cut above the  first two ties and removed with an endobag.  Copious irrigation with  lactated Ringer's followed in the area of the base of the appendix, no  bleeding noted.  Afterwards irrigant removed. Gas released, trocars removed, closure of the  10 mm fascial site with #0 Vicryl suture and a 4-0 Vicryl for subcuticular  closure. Instrument and sponge count were correct. The patient tolerated the  procedure well and returned to post anesthesia recovery room in excellent  condition.     Stev   SHB/MEDQ  D:  08/06/2004  T:  08/06/2004  Job:  [id number]
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
## 2011-03-04 NOTE — Consult Note (Signed)
NAMEADLEAN, HARDEMAN             ACCOUNT NO.:  [id number]   MEDICAL RECORD NO.:  [id number]          PATIENT TYPE:  INP   LOCATION:  9302                          FACILITY:  [facility]   PHYSICIAN:  Ginger Carne, MD  DATE OF BIRTH:  Jul 09, 1981   DATE OF CONSULTATION:  06/07/2005  DATE OF DISCHARGE:                                   CONSULTATION   PREOPERATIVE DIAGNOSIS:  Recurrent chronic pelvic inflammatory disease.   POSTOPERATIVE DIAGNOSIS:  1.  Recurrent chronic pelvic inflammatory disease.  2.  Stage I endometriosis.   OPERATION/PROCEDURE:  1.  Diagnostic laparoscopy.  2.  Dilatation and curettage.   SURGEON:  Ginger Carne, M.D.   ASSISTANT:  None.   COMPLICATIONS:  None immediate.   ESTIMATED BLOOD LOSS:  Minimal.   SPECIMENS:  Uterine curettings.   ANESTHESIA:  General.   OPERATIVE FINDINGS:  External genitalia, vulva and vagina were normal.  Cervix smooth without erosions or lesions.  Endometrial curettings were  obtained.  There was no evidence of any other tissue besides endometrial  lining noted in the endometrial cavity.  Laparoscopic evaluation revealed evidence for chronic pelvic inflammatory  disease and stage I endometriosis.  There was a light yellow filmy puralent  material on both tubes and uterus consistent with chronic pelvic  inflammatory disease.  There was no actual adhesive disease on either  adnexa.  The uterus was normal size.  There were areas of small blue and red  punctate lesions in the cul-de-sac and the uterosacral ligaments consistent  with stage I endometriosis.  Upper abdomen was normal.  There were no  evidence of Fitzhugh-Curtis syndrome.  No evidence of femoral or inguinal  obturator hernias.  No evidence of large or small bowel abnormalities.   DESCRIPTION OF PROCEDURE:  The patient prepped and draped in the usual  fashion and placed in the lithotomy position.  Betadine solution used for  antiseptic and the patient was  catheterized prior to the procedure.  After  adequate general anesthesia, tenaculum was placed on the anterior lip of the  cervix.  A curettage was performed with minimal dilatation.  Specimen sent  to pathology.  Afterwards a vertical infraumbilical incision was made and  the Veress needle placed in the abdomen.  Opening and closing pressures were  10 and 15 mmHg.  Needle was released.  Trocar placed in the same incision.  Laparoscope placed in the trocar sleeve.  One 5 mm port was made in the left  lower quadrant under direct visualization.  Inspection of the pelvic  contents carried out.  Photography taken.  Afterwards gas released, trocar is removed.  Closure of  the 10 mm fascia site with 0 Vicryl suture and 4-0 Vicryl for the  subcuticular closure.  Instrument and sponge counts were correct.  The  patient tolerated the procedure well and transferred to post anesthesia  recovery in excellent condition.      Ginger Carne, MD  Electronically Signed     SHB/MEDQ  D:  06/07/2005  T:  06/08/2005  Job:  [id number]

## 2011-03-04 NOTE — Discharge Summary (Signed)
NAMECOLLENE, [name]             ACCOUNT NO.:  [id number]   MEDICAL RECORD NO.:  [id number]          PATIENT TYPE:  WOC   LOCATION:  [facility]                          FACILITY:  [facility]   PHYSICIAN:  Angie B. Merlene Morse, MD  DATE OF BIRTH:  07-31-1981   DATE OF ADMISSION:  02/09/2006  DATE OF DISCHARGE:  02/12/2006                                 DISCHARGE SUMMARY   ADMITTING ATTENDING:  Dr.  Penne Lash.   DISCHARGING ATTENDING:  Dr. Penne Lash.   PROCEDURES:  1.  Elect repeat C-section.  2.  BTL.   ADMITTING DIAGNOSES:  1.  Preeclampsia.  2.  Previous cesarean section.  3.  Desires permanent sterilization.   DISCHARGE DIAGNOSES:  1.  Status post cesarean section.  2.  Bilateral tubal ligation.  3.  Preeclampsia, resolved.   ADMITTING HISTORY AND PHYSICAL:  Briefly, the patient is a 30 year old G2,  para 1-0-0-1 at 49 and 5 with increased blood pressure and hand swelling  with multiple 24-hour urines and lab evaluations for preeclampsia, but has  not been diagnosed.  Blood pressure was 140s/90s the day of admission.  No  headaches, epigastric pain or scotomata.   ALLERGIES:  1.  LATEX.  2.  PENICILLIN.   PAST HISTORY:  Hypertension with last pregnancy _____ in 2003.   MEDICATIONS:  Prenatal vitamins.   FAMILY HISTORY:  None.   EXAMINATION:  GENERAL:  A well-developed, well-nourished female in no acute  distress.  LUNGS:  Clear.  CARDIOVASCULAR:  Normal S1 and S2.  No murmurs, gallops or rubs.  ABDOMEN:  Gravid and nontender.   Hemoglobin 12.7.   HOSPITAL COURSE:  The patient underwent a C-section with BTL on April 26.  She remained stable postpartum on magnesium.  The magnesium was stopped  after greater than 24 hours after good diuresis.  The patient's blood  pressure remained stable and not elevated.  The patient's pain was  controlled and bleeding was controlled at the time of discharge.   DISCHARGE INSTRUCTIONS:  The patient was discharged to home.   DISCHARGE  MEDICINES:  1.  Percocet.  2.  Ibuprofen.  3.  Colace.  4.  Prenatal vitamins.   DIET:  Regular.   The patient is to follow up at [facility] in 6 weeks.  She is to call for  an appointment.           ______________________________  August Saucer. Merlene Morse, MD     ABC/MEDQ  D:  02/12/2006  T:  02/13/2006  Job:  [id number]
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
## 2011-03-04 NOTE — H&P (Signed)
[name], [name]             ACCOUNT NO.:  [id number]   MEDICAL RECORD NO.:  [id number]          PATIENT TYPE:  INP   LOCATION:  9311                          [facility]   PHYSICIAN:  Ginger Carne, MD  DATE OF BIRTH:  [birth year]   DATE OF ADMISSION:  05/23/2005  DATE OF DISCHARGE:                                HISTORY & PHYSICAL   REASON FOR HOSPITALIZATION:  Post-aspiration dilation and curettage, pelvic  pain with fever, and pelvic inflammatory disease.   HISTORY OF PRESENT ILLNESS:  This patient is a 30 year old, gravida 2, para  1-0-0-1, Caucasian female who had undergone an aspiration dilation and  curettage on May 13, 2005 because of an incomplete abortion.  The patient  was approximately 6 weeks in gestation with spotting and a non-viable  intrauterine pregnancy.  The patient did well until the evening of May 16, 2005 when she began developing a fever of 102 degrees Fahrenheit starting on  May 14, 2005.  The patient was asked to go to the [facility] at that time, but was unable to make her emergency visit.  She  was start the evening of May 16, 2005 on Levaquin 500 mg for 10 days, and  metronidazole 500 mg twice a day.  There is some question if she had been  fully compliant with her medication; however, the patient was followed over  the past week.  Although her pain has somewhat subsided it is still ranked  as 8/10.  Her temperature has fluctuated between 99.1 and 101 degrees  Fahrenheit.  At today's office visit, the patient was noted not have 2+  tenderness bilaterally in the adnexa with minimal bleeding and no discharge.  A gonorrhea and Chlamydia culture was negative from May 17, 2005.   OBSTETRIC AND GYNECOLOGIC HISTORY:  The patient has had a cesarean section  in 2003.   CURRENT MEDICATIONS:  1.  Effexor 75 mg daily.  2.  Vicodin as needed.  3.  Levaquin 500 mg daily.  4.  Metronidazole 500 mg twice a day.  5.  Benadryl  as needed.   ALLERGIES:  1.  LATEX.  2.  PENICILLIN.   MEDICAL HISTORY:  Depression.   SURGICAL HISTORY:  Laparoscopic appendectomy in October 2005.   SOCIAL HISTORY:  The patient denies smoking, illicit drug abuse, or alcohol  abuse.   FAMILY HISTORY:  Unremarkable.   REVIEW OF SYSTEMS:  Negative.   PHYSICAL EXAMINATION:  HEENT:  Grossly normal.  CHEST:  Clear.  CARDIAC:  Without murmurs or enlargement.  BREASTS:  Without mass, discharge, thickenings, or tenderness.  CHEST:  Clear to percussion and auscultation.  EXTREMITIES/LYMPHATICS/SKIN/NEUROLOGICAL/MUSCULOSKELETAL SYSTEMS:  Within  normal limits.  ABDOMEN:  Tender bilaterally in the lower quadrants.  PELVIC:  External genitalia, vulva, and vagina, normal Pap smear.  Gonorrhea  and Chlamydia cultures negative from May 17, 2005.  Uterus is tender on  motion.  Both adnexa palpable and found to be tender.  RECTAL:  Hemoccult negative without masses.   PLAN:  The patient will be admitted for intravenous therapy and management  of postoperative endometritis and/or pelvic inflammatory disease.  She will  be started on clindamycin 800 mg three times a day intravenously and  gentamicin per protocol.  Pelvic sonogram, in addition to laboratory work,  will be obtained.  Pathology report demonstrates products of conception from  the aspirate following her dilatation and curettage.       SHB/MEDQ  D:  05/23/2005  T:  05/23/2005  Job:  [id number]

## 2011-03-04 NOTE — Discharge Summary (Signed)
[name], [name]             ACCOUNT NO.:  [id number]   MEDICAL RECORD NO.:  [id number]          PATIENT TYPE:  INP   LOCATION:  9319                          [facility]  [facility]   PHYSICIAN:  Ginger Carne, MD  DATE OF BIRTH:  27-Dec-1980   DATE OF ADMISSION:  08/05/2004  DATE OF DISCHARGE:                                 DISCHARGE SUMMARY   FINAL DIAGNOSIS:  Subacute appendicitis.   IN-HOSPITAL PROCEDURES:  Laparoscopic appendectomy.   HOSPITAL COURSE:  This is a 30 year old Caucasian female gravida 1 para 1-0-  0-1 admitted because of a 5-day history of worsening right lower quadrant  pain.  CAT scan of the abdomen and pelvis with contrast obtained at [facility] 5 days prior to admission revealed evidence of  nonfilling of said appendix and no other processes noted.  The patient's  white count was normal, she was afebrile, but had significant anorexia and  point tenderness in the right hypogastric region.  The patient was taken to  the operating room on August 06, 2004 which demonstrated an erythematous  appendix but without acute exudate.  Her procedure was uneventful.  Postoperatively, she did well, she was afebrile.  The patient was discharged  with Dilaudid 2 mg one to two q.4-6h. previously prescribed, and will return  to the office in 1 week's time.  She was asked to eat lightly for the next  24 hours, wound care discussed with the patient, and she will be contacted  tomorrow for follow-up care.  She was asked to report any temperature  elevation above 100.4 degrees Fahrenheit, increasing abdominal pain, or any  other concerns.     Stev   SHB/MEDQ  D:  08/06/2004  T:  08/06/2004  Job:  [id number]
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
## 2011-03-04 NOTE — Op Note (Signed)
NAMEMARISA, [name]             ACCOUNT NO.:  [id number]   MEDICAL RECORD NO.:  [id number]          PATIENT TYPE:  AMB   LOCATION:  SDC                           [facility]   PHYSICIAN:  Ginger Carne, MD  DATE OF BIRTH:  09-Apr-1981   DATE OF PROCEDURE:  05/13/2005  DATE OF DISCHARGE:                                 OPERATIVE REPORT   PREOPERATIVE DIAGNOSIS:  First trimester incomplete abortion.   POSTOPERATIVE DIAGNOSIS:  First trimester incomplete abortion.   PROCEDURE:  Aspiration, dilatation and curettage.   SURGEON:  Ginger Carne, MD   ASSISTANT:  None.   COMPLICATIONS:  None immediate.   ESTIMATED BLOOD LOSS:  Minimal.   SPECIMENS:  Products of conception.   ANESTHESIA:  General.   FINDINGS:  External genitalia, vulva and vagina normal. Cervix smooth  without erosions or lesions. The uterus sounded to approximately 6 cm with  products of conception noted. The uterus was about six weeks in size, both  adnexa palpable, found to be normal. The patient is Rh positive.   DESCRIPTION OF PROCEDURE:  The patient was prepped and draped in the usual  fashion and placed in lithotomy position. Betadine solution used for  antiseptic and the patient was catheterized prior to the procedure. Latex  precautions utilized. Tenaculum placed on the anterior lip of the cervix, 18  mL of Marcaine with epinephrine 0.5% was utilized circumferentially as a  paracervical block. Dilatation to accommodate a #7 suction curette was  followed by no active bleeding. Cavity was smooth and empty. At the end of  the procedure minimal bleeding noted. The patient tolerated the procedure  well and returned to the post anesthesia recovery room in excellent  condition.       SHB/MEDQ  D:  05/13/2005  T:  05/13/2005  Job:  [id number]

## 2011-03-04 NOTE — Assessment & Plan Note (Signed)
NAMESHAREENA, NUSZ             ACCOUNT NO.:  [id number]   MEDICAL RECORD NO.:  [id number]          PATIENT TYPE:  POB   LOCATION:  [facility]         FACILITY:  [facility]   PHYSICIAN:  Elsie Lincoln, MD      DATE OF BIRTH:  1981-08-25   DATE OF SERVICE:  08/03/2006                                    CLINIC NOTE   HISTORY:  The patient is a 30 year old female who presents for her yearly  exam.  The patient is undergoing a workup with her family doctor at this  time for hypertension, weight gain and also syncopal episode.  Dr. Shawnie Pons  recently did a TSH which was low so Free T3 and Free T4 are being drawn  today so this is not really consistent with hypothyroidism.  I do not  believe that she has anything residual from her pregnancy.  Her diastolic  today is 103 and this is higher than any diastolic she ever had in  pregnancy.  Also, she never was diagnosed with preeclampsia in pregnancy and  even if she had been all preeclamptic effects have usually been resolved by  6 weeks.  At this point the patient would be given a diagnosis of chronic  hypertension.  She also has gained 5 pounds in the past 2 weeks according to  our scale.  This is very concerning to the patient.  I do not know  metabolically what is going on and I hope that her family medical physician  can help figure this out.  I will send a copy to her at [facility].  I believe she sees Sharen Hint at [facility].  Gynecologically she has no problems, she is amenorrheic and I did do a  pregnancy test which was negative, the amenorrhea is secondary to  breastfeeding.  She did have a tubal ligation which is her form of birth  control.  I do believe she has a little bit of problems with depression but  I think it could be situational to her health and weight gain.   PHYSICAL EXAMINATION:  GENERAL:  Well nourished, well developed, no apparent  distress.  THYROID:  No masses.  LUNGS:   Clear to auscultation bilaterally.  HEART:  Regular rate and rhythm.  BREASTS:  No masses.  No lymphadenopathy.  ABDOMEN:  Soft and nontender.  Incision well healed.  No hernia.  GENITALIA:  Tanner V.  Vagina pink.  Normal rugae.  Uterus small, nontender,  mobile.  Adnexa no masses, nontender.  EXTREMITIES:  Nontender.   ASSESSMENT AND PLAN:  A 45 -year-old female with normal gynecological exam.   1. Free T3, Free T4 drawn today.  2. Pap smear.  3. Follow up with family practice doctor to help elicit diagnosis of      chronic hypertension.          ______________________________  Elsie Lincoln, MD    KL/MEDQ  D:  08/03/2006  T:  08/04/2006  Job:  [id number]   cc:   Liane Comber, NP

## 2011-03-04 NOTE — Discharge Summary (Signed)
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   IN-HOSPITAL PROCEDURES:  Diagnostic laparoscopy and intravenous antibiotic  therapy.   HOSPITAL COURSE:  This patient is a 30 year old Caucasian female, gravida 2  para 1-0-1-1 who was admitted on August22,2006 because of recurrent pelvic  pain, tenderness, and a temperature elevation of 101 degrees Fahrenheit. The  patient had undergone a suction dilation and extraction on July28,2006  because of a missed abortion. The following day the patient developed  abdominal pain, tenderness, and a temperature elevation. She had been  treated initially with oral antibiotics and then hospitalized between May 23, 2005 and May 26, 2005 with acute pelvic inflammatory disease.  Antibiotic therapy was administered. The patient had to leave earlier then  would have been advised due to home social problems. Her pain recurred and  was placed on a 1-week course of antibiotic therapy prior to admission until  she was readmitted due to an exacerbation of her pelvic pain. Her  temperature elevation was 101 degrees Fahrenheit at that time. She was  admitted on August22 and underwent a diagnostic laparoscopy. At that time,  stage 1 endometriosis and recurrent chronic pelvic inflammatory disease was  noted. Specifically, the tubes and ovaries and uterus had a yellow filmy  adhesive purulent material consistent with pelvic inflammatory disease. She  was restarted on intravenous antibiotics including clindamycin and  gentamicin. Although the  patient was afebrile and her white count was normal  at the time of this admission, the patient had considerable pain which  abated in the opinion of the surgeon only approximately 50% at the time of  discharge. The patient insisted on returning home due to social problems  related to her son and family. The patient understands that the care  provided thus far is incomplete and that recurrent symtoms of pelvic  inflammatory disease may occur. It should be added that the patient had a 2-  week course of Levaquin and metronidazole prescribed following her initial  hospitalization on May 23, 2005. Prior to this hospitalization, the  patient had been restarted on metronidazole and Levaquin for three days. The  patient was prescribed doxycycline 100 mg twice a day for 2 weeks. Advised  to contact the office for temperature elevation above 100.4 degrees  Fahrenheit, increasing pelvic pain, increasing vaginal discharge associated  with pain, or any other concerns. The patient had a negative gonorrhea and  chlamydia culture at the time of her initial hospitalization. The patient  will return to the office in 3 weeks.      Ginger Carne, MD  Electronically Signed     SHB/MEDQ  D:  06/10/2005  T:  06/10/2005  Job:  [id number]

## 2011-03-04 NOTE — Group Therapy Note (Signed)
[name], [name] NO.:  [id number]   MEDICAL RECORD NO.:  [id number]          PATIENT TYPE:  WOC   LOCATION:  [facility]                   FACILITY:  [facility]   PHYSICIAN:  Elsie Lincoln, MD      DATE OF BIRTH:  07/13/1981   DATE OF SERVICE:  03/23/2006                                    CLINIC NOTE   The patient is a 30 year old female who is here for postpartum exam.  She  had a repeat C-section at the end of April 2007.  She has done well  postoperatively.  She also had a BTL.  She has had occasional bleeding and  has had intercourse 4 days ago without incident.  Her only complaint is of  headaches that last 4-5 days and do not respond to Tylenol or Motrin.  She  has also been having problems sleeping.  However, she is breastfeeding and  the infant does not sleep through the night, and this could be considered  normal.  She takes an occasional Tylenol PM which gives her a little sleep  but not a lot.  She could be having migraines that are being triggered by  her lack of sleep.  The patient is also breastfeeding and doing well with  this.   PHYSICAL EXAMINATION:  BREASTS:  Soft, nontender.  No lymphadenopathy.  ABDOMEN:  Soft, nontender, nondistended.  Well-healed Pfannenstiel skin  incision.  PELVIC:  Genitalis Tanner V.  Cervix closed, nontender.  Uterus anteverted,  nontender.  Adnexa no masses, nontender.   ASSESSMENT AND PLAN:  A 30 year old female for postpartum exam.   1.  Blood pressure today is 145/87.  This could be due to stress.  I do not      want to give her diagnosis of chronic hypertension.  When she comes back      in 3-4 months for her Pap smear we will make sure her blood pressure is      within normal limits.  2.  No signs of postpartum depression.  She is on Effexor for a history of      depression.  3.  Pap smear in 3-4 months.  4.  Fioricet for headaches.  5.  BTL for contraception.           ______________________________  Elsie Lincoln, MD     KL/MEDQ  D:  03/23/2006  T:  03/23/2006  Job:  [id number]
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
## 2011-03-04 NOTE — H&P (Signed)
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  The patient is scheduled for surgery on June 23, 2005.   HISTORY OF PRESENT ILLNESS:  This patient is a 30 year old gravida 2, para 1-  0-1-1, Caucasian female admitted because of exacerbation of chronic pelvic  pain.  The patient was diagnosed with pelvic inflammatory disease  laparoscopically on June 07, 2005.  This was preceded by an initial  diagnosis on May 14, 2005.  She had had an incomplete abortion followed by  an aspiration, dilatation and curettage on May 13, 2005.  At that time, the  patient did well until the following day when she developed the onset of  fever, chills, temperature elevation to 102 degrees Fahrenheit, and pelvic  and abdominal pain.  The patient was declined to be hospitalized despite  being urged to do so; however, she was treated orally with metronidazole and  Levaquin.  She was initially admitted to the hospital on May 23, 2005 with  the diagnosis of acute pelvic inflammatory disease.  Gonorrhea and Chlamydia  cultures were negative prior to her admission, although she had been started  on the course of metronidazole and Levaquin prior to said cultues being  taken.  Despite the patient's initial improvement in the hospital, she  signed out against medical advice with continued pelvic pain on May 26, 2005 due to home problems.  She was given another prescription for  metronidazole and Levaquin, which she had taken for 2 weeks.  The patient  was once again readmitted on June 07, 2005 because of recurrent  severe  pelvic pain secondary to the presumptive diagnosis of pelvic inflammatory  disease.  She had met the CDC criteria of PID with examination showing  abdominal tenderness, adnexal tenderness and cervical motion tenderness. She  underwent a diagnostic laparoscopy on June 07, 2005 which confirmed acute  pelvic inflammatory disease with stage I endometriosis.  She was  hospitalized through June 10, 2005, but once again signed out against  medical advice, despite having continued pain.  At that point, she was  placed on doxycycline 100 mg twice a day, and asked to follow up in 2 weeks  in the office.  The patient did not show up for her scheduled appointment.  She called on the evening of June 21, 2005 complaining of significant  pelvic pain.  The patient declined readmission to the hospital for intensive  and complete intravenous antibiotic therapy. She has lost considerable time  from work due to her pain. She understands that the inability to initially  aggressively treat her PID and the failure to satisfactorily complete two  separate courses of intravenous therapy has led to her continual pain  and  undermanagement of her PID.   OBSTETRIC AND GYNECOLOGIC HISTORY:  The patient had a primary cesarean  section in 2003, and had dilation and curettage on May 13, 2005 for an  incomplete abortion.   CURRENT MEDICATIONS:  1.  Effexor 75 mg daily.  2.  Vicodin as needed.   ALLERGIES:  1.  LATEX.  2.  PENICILLIN.   MEDICAL HISTORY:  Depression.   SURGICAL HISTORY:  Laparoscopic appendectomy in October of 2005.   SOCIAL HISTORY:  The patient denies smoking, illicit drug abuse, or alcohol  abuse.   FAMILY HISTORY:  Unremarkable.   REVIEW OF SYSTEMS:  Per above.   PHYSICAL EXAMINATION:  GENERAL:  A pleasant female in no acute distress.  VITAL SIGNS:  Weight 167 pounds, height 5 feet, 6 inches.  Blood pressure  120/70.  HEENT:  Grossly normal.  BREAST EXAM:  Without mass,  discharge, thickenings, or tenderness.  CHEST:  Clear to percussion and auscultation.  CARDIOVASCULAR:  Without murmurs or enlargement.  Regular rate and rhythm.  EXTREMITIES/LYMPHATICS/SKIN/NEUROLOGICAL/MUSCULOSKELETAL:  Within normal  limits.  ABDOMEN:  Tender bilaterally in the lower quadrants without  organosplenomegaly.  PELVIC:  External genitalia, vulva, and vagina demonstrate significant  tenderness on cervical motion, as well as adnexal tenderness.  RECTAL:  Hemoccult negative without masses.   IMPRESSION:  Acute exacerbation of pelvic inflammatory disease.   PLAN:  Due to the patient's age, concern regarding her decision to undergo a  laparoscopically-assisted vaginal hysterectomy and bilateral salpingo-  oophorectomy was expressed to the patient and her husband.  She has  apparently not been capable of staying in the hospital due to home issues,  but cannot continue with significant discomfort.  The patient has lost  considerable time from work. She understands the said surgery will render  her sterile.  She also understands that by leaving one or part of one or  both adnexae may result in continued pain.  Therefore, an incomplete removal  of both adnexae or leaving her uterus was not advised.The nature of the said  procedure discussed in detail.  Hormone replacement therapy discussed in  detail.  All questions answered to the satisfaction of said patient.  Risks  including injury to ureter, bowel, bladder, possible conversion to an open  procedure, hemorrhage possibly requiring blood transfusion, postoperative  infection, in addition to pulmonary complications postoperatively, were  discussed and understood by said patient. The patient was asked to  reconsider aggressive intravenous antibiotic therapy  in the hospital for a period of time which would assure she was completely  free of pelvic pain. She declined to do so and understands the consequences of her past and  present decisions as it relates to her fertility, HRT  requirements and necessity  for definitive surgery. She declined a second opinion.      Ginger Carne, MD  Electronically Signed     SHB/MEDQ  D:  06/22/2005  T:  06/22/2005  Job:  [id number]

## 2011-03-04 NOTE — H&P (Signed)
NAMELEONELA, KIVI             ACCOUNT NO.:  [id number]   MEDICAL RECORD NO.:  [id number]           PATIENT TYPE:   LOCATION:                                 FACILITY:   PHYSICIAN:  Ginger Carne, MD  DATE OF BIRTH:  [birth year]   DATE OF ADMISSION:  DATE OF DISCHARGE:                                HISTORY & PHYSICAL   ADMITTING DIAGNOSIS:  Right lower quadrant pain, possible acute or subacute  appendicitis.   HOSPITAL PROCEDURES:  Diagnostic laparoscopy, possible laparoscopic  appendectomy.   HISTORY OF PRESENT ILLNESS:  This is a 30 year old, gravida 1, para 1-0-0-1,  Caucasian female with a 5-day history of worsening right lower quadrant  pain.  The patient was seen in the emergency room at [facility] on August 01, 2004 with symptoms of anorexia and right lower  quadrant pain and cramping.  The patient states that she has had no vomiting  and no symptoms of genitourinary or gastrointestinal complaints.  No  diarrhea.  She states that over the past 72 hours the pain has worsened  significantly.  She has no musculoskeletal trauma to account for the sudden  discomfort, as well.  She had undergone a CT of the abdomen and pelvis with  standard contrast at the hospital on the say day; specifically, the appendix  was poorly visualized with no periappendiceal inflammatory changes.  In  consultation with the radiologist, it was felt that the appendix may have a  fecalith and/or was narrowed, and did not admit dye in because the patient  is thin and may not have shown any periappendiceal abnormalities.  In either  case, appendicitis could not be excluded.  There was cul-de-sac fluid, but  otherwise an unremarkable evaluation.   OBSTETRIC AND GYNECOLOGIC HISTORY:  The patient underwent a primary low  transverse cesarean section in December of 2003.  Otherwise, no specific  gynecological history of note.   ALLERGIES:  1.  PENICILLIN.  2.  LATEX.   CURRENT MEDICATIONS:  Percocet 1-2 q.4-6h. prescribed by the emergency room.   SOCIAL HISTORY:  The patient smokes about half a pack of cigarettes daily.  Otherwise, does not abuse alcohol and/or illicit drugs.   REVIEW OF SYSTEMS:  Unremarkable.   FAMILY HISTORY:  Negative for breast, colon, ovarian, or uterine carcinoma  in first degree relatives.   LABORATORY FINDINGS:  CT scan per above.  In-office ultrasound reveals  normal-appearing uterus, small left ovarian cyst, compatible with a  follicular cyst.  The patient's last menstrual period was July 24, 2004.   The patient's menses are every 28 days.  She had just stopped using Alesse  oral contraceptives with anticipation of conception.  The above findings and  ultrasound compatible with a follicular cyst and ultrasound pattern of the  endometrial stripe consistent with periovulation.   PHYSICAL EXAMINATION:  VITAL SIGNS:  Blood pressure of 100/64, height 5  feet, 7 inches, weight 169 pounds.  HEENT:  Grossly normal.  BREAST EXAM:  Without mass, discharge, thickening, or tenderness.  CHEST:  Clear to percussion and auscultation.  CARDIOVASCULAR:  Without  murmurs or enlargements.  Regular rate and rhythm  EXTREMITIES/LYMPHATICS/SKIN/VASCULAR/NEUROLOGICAL/MUSCULOSKELETAL SYSTEMS:  Within normal limits.  ABDOMEN:  Tenderness in the right lower quadrant and right epigastric region  with minimal rebound.  No evidence of organosplenomegaly.  PELVIC:  External genitalia, vulva, and vagina normal.  Cervix smooth  without eruptions or lesions.  GenProbe performed in the emergency room at  [facility] demonstrates negative gonorrhea and  Chlamydia findings.  Uterus was tender minimally.  Right adnexa is tender.  Left adnexa nontender.  RECTAL:  Hemoccult negative without masses.   IMPRESSION:  Right lower quadrant pain, possible appendicitis.   PLAN:  The patient's pain has worsened.  The possibility of  __________  versus appendicitis has to be considered.  The patient had a normal white  count in the emergency room; however, this does not exclude the possibility  of appendicitis.  The patient will be taken to the operating room for  evaluation with possible laparoscopic appendectomy.     Stev   SHB/MEDQ  D:  08/05/2004  T:  08/05/2004  Job:  [id number]

## 2011-03-04 NOTE — Discharge Summary (Signed)
NAMEBRETT, [name]             ACCOUNT NO.:  [id number]   MEDICAL RECORD NO.:  [id number]          PATIENT TYPE:  INP   LOCATION:  9311                          FACILITY:  [facility]   PHYSICIAN:  Ginger Carne, MD  DATE OF BIRTH:  09/10/1981   DATE OF ADMISSION:  05/23/2005  DATE OF DISCHARGE:                                 DISCHARGE SUMMARY   REASON FOR HOSPITALIZATION:  Post aspiration dilatation curettage following  a first trimester incomplete abortion, pelvic pain with fever and pelvic  inflammatory disease.   IN-HOSPITAL PROCEDURES:  Pelvic sonogram and intravenous antibiotics.   FINAL DIAGNOSIS:  Post aspiration dilatation curettage pelvic inflammatory  disease.   HOSPITAL COURSE:  This patient is a 30 year old gravida 2 para 1-0-1-1  Caucasian female who underwent an aspiration dilatation and curettage on  July28,2006 because of a first trimester incomplete abortion at 6 weeks. The  patient did well until July 31 which time she developed a temperature  elevation of 102 degrees Fahrenheit beginning on July29 in addition to  discomfort in her lower abdomen. She was unable to come to the [facility] of [place] because of transportation issues. However, she was  started on the evening of July31,2006 on Levaquin 500 mg for 10 days and  metronidazole 500 mg twice a day. (The patient is allergic to PENICILLIN.)  There was some question about her full compliance with her medication;  however, after she was seen in the office 2 days later she stated that she  has been taking her medications as prescribed. The patient had done  reasonably well until the day of her admission on August7,2006 at which time  she was still running a low-grade fever between 99.1 and 101 degrees  Fahrenheit with continued lower abdominal pain. Pelvic examination revealed  tenderness bilaterally in the adnexa with cervical motion tenderness and  lower abdominal tenderness and pain, with a  negative gonorrhea and chlamydia  culture. The patient was therefore admitted to the hospital for management  of pelvic inflammatory disease.   She was started on gentamicin 150 mg q.24h. and clindamycin 900 mg IV q.8h.  During the course of her hospitalization her pain abated to the point where  she had only minimal pain in the right lower quadrant in addition to no  discomfort in the left lower quadrant and the uterus. There was minimal  cervical motion tenderness and adnexal tenderness on examination on  discharge.   The patient's admission laboratory work demonstrated a white count of 4.9,  hemoglobin of 12.9, and hematocrit of 38.2. Remaining laboratory work was  normal and a serum pregnancy test was negative.   The patient was discharged with instructions to contact the office for  temperature elevation above 100.4 degrees Fahrenheit, increasing abdominal  pain, diarrhea. She was asked to continue metronidazole 500 mg twice a day  for one more week and Levaquin 500 mg daily for 7 days. The patient will be  rechecked in 3 weeks but will contact the office in 4 days for follow-up.      Ginger Carne, MD  Electronically Signed  SHB/MEDQ  D:  05/26/2005  T:  05/26/2005  Job:  [id number]

## 2011-05-01 ENCOUNTER — Ambulatory Visit: Payer: Self-pay | Admitting: Family Medicine

## 2011-07-08 LAB — CBC
HCT: 29.8 — ABNORMAL LOW
HCT: 41.2
Hemoglobin: 10.8 — ABNORMAL LOW
Hemoglobin: 14.2
MCV: 90.6
Platelets: 260
Platelets: 271
RBC: 3.39 — ABNORMAL LOW
RBC: 4.57
RDW: 12.3
RDW: 12.7
WBC: 10.9 — ABNORMAL HIGH

## 2011-07-08 LAB — PROTIME-INR
INR: 1.1
Prothrombin Time: 14

## 2011-07-08 LAB — HCG, SERUM, QUALITATIVE: Preg, Serum: NEGATIVE

## 2011-07-08 LAB — ABO/RH: ABO/RH(D): O POS

## 2011-07-08 LAB — TYPE AND SCREEN: Antibody Screen: NEGATIVE

## 2011-07-26 LAB — CBC
HCT: 39.3
Hemoglobin: 13.7
RBC: 4.4
RDW: 12.8

## 2011-11-11 ENCOUNTER — Ambulatory Visit: Payer: Self-pay | Admitting: Internal Medicine

## 2012-03-12 ENCOUNTER — Emergency Department: Payer: Self-pay | Admitting: *Deleted

## 2012-03-12 LAB — COMPREHENSIVE METABOLIC PANEL
Albumin: 4.1 g/dL (ref 3.4–5.0)
Anion Gap: 9 (ref 7–16)
BUN: 13 mg/dL (ref 7–18)
Bilirubin,Total: 0.4 mg/dL (ref 0.2–1.0)
Calcium, Total: 9.1 mg/dL (ref 8.5–10.1)
Chloride: 106 mmol/L (ref 98–107)
Creatinine: 0.67 mg/dL (ref 0.60–1.30)
EGFR (African American): >60
Osmolality: 283 (ref 275–301)

## 2012-03-12 LAB — URINALYSIS, COMPLETE
Bilirubin,UR: NEGATIVE
Blood: NEGATIVE
Glucose,UR: NEGATIVE mg/dL (ref 0–75)
Ketone: NEGATIVE
Specific Gravity: 1.021 (ref 1.003–1.030)
Squamous Epithelial: <1

## 2012-03-12 LAB — CBC
HCT: 38.6 % (ref 35.0–47.0)
MCH: 29.9 pg (ref 26.0–34.0)
MCHC: 34.1 g/dL (ref 32.0–36.0)
MCV: 88 fL (ref 80–100)
RDW: 12.7 % (ref 11.5–14.5)
WBC: 9.1 10*3/uL (ref 3.6–11.0)

## 2012-03-12 LAB — LIPASE, BLOOD: Lipase: 38 U/L — ABNORMAL LOW (ref 73–393)

## 2012-09-27 ENCOUNTER — Inpatient Hospital Stay: Payer: Self-pay | Admitting: Internal Medicine

## 2012-09-27 LAB — URINALYSIS, COMPLETE
Bilirubin,UR: NEGATIVE
Blood: NEGATIVE
Hyaline Cast: 4
Ketone: NEGATIVE
Leukocyte Esterase: NEGATIVE
Nitrite: POSITIVE
Ph: 5 (ref 4.5–8.0)
Protein: NEGATIVE
Specific Gravity: 1.012 (ref 1.003–1.030)

## 2012-09-27 LAB — DRUG SCREEN, URINE
Amphetamines, Ur Screen: NEGATIVE (ref ?–1000)
Cocaine Metabolite,Ur ~~LOC~~: NEGATIVE (ref ?–300)
MDMA (Ecstasy)Ur Screen: POSITIVE (ref ?–500)
Opiate, Ur Screen: POSITIVE (ref ?–300)
Tricyclic, Ur Screen: POSITIVE (ref ?–1000)

## 2012-09-27 LAB — COMPREHENSIVE METABOLIC PANEL
Albumin: 4.2 g/dL (ref 3.4–5.0)
Alkaline Phosphatase: 105 U/L (ref 50–136)
BUN: 6 mg/dL — ABNORMAL LOW (ref 7–18)
Bilirubin,Total: 0.5 mg/dL (ref 0.2–1.0)
Co2: 23 mmol/L (ref 21–32)
Creatinine: 0.76 mg/dL (ref 0.60–1.30)
Glucose: 77 mg/dL (ref 65–99)
SGPT (ALT): 39 U/L (ref 12–78)
Sodium: 141 mmol/L (ref 136–145)

## 2012-09-27 LAB — ACETAMINOPHEN LEVEL: Acetaminophen: <2 ug/mL

## 2012-09-27 LAB — SALICYLATE LEVEL: Salicylates, Serum: <1.7 mg/dL

## 2012-09-27 LAB — CBC
HCT: 37.9 % (ref 35.0–47.0)
HGB: 13 g/dL (ref 12.0–16.0)
MCHC: 34.3 g/dL (ref 32.0–36.0)
MCV: 87 fL (ref 80–100)
Platelet: 300 10*3/uL (ref 150–440)
RBC: 4.37 10*6/uL (ref 3.80–5.20)
RDW: 12.1 % (ref 11.5–14.5)
WBC: 7.3 10*3/uL (ref 3.6–11.0)

## 2012-09-27 LAB — ETHANOL: Ethanol %: 0.206 % — ABNORMAL HIGH (ref 0.000–0.080)

## 2012-09-27 LAB — TROPONIN I: Troponin-I: <0.02 ng/mL

## 2012-09-28 LAB — CBC WITH DIFFERENTIAL/PLATELET
Basophil %: 0.2 %
Eosinophil %: 0.3 %
HGB: 13.6 g/dL (ref 12.0–16.0)
Lymphocyte %: 16.8 %
Monocyte %: 6 %
Neutrophil #: 7.1 10*3/uL — ABNORMAL HIGH (ref 1.4–6.5)
Neutrophil %: 76.7 %
Platelet: 295 10*3/uL (ref 150–440)
RBC: 4.35 10*6/uL (ref 3.80–5.20)
WBC: 9.3 10*3/uL (ref 3.6–11.0)

## 2012-09-28 LAB — T4, FREE: Free Thyroxine: 1.17 ng/dL (ref 0.76–1.46)

## 2012-09-28 LAB — BASIC METABOLIC PANEL
Anion Gap: 8 (ref 7–16)
BUN: 4 mg/dL — ABNORMAL LOW (ref 7–18)
Chloride: 112 mmol/L — ABNORMAL HIGH (ref 98–107)
Creatinine: 0.71 mg/dL (ref 0.60–1.30)
EGFR (Non-African Amer.): >60
Glucose: 77 mg/dL (ref 65–99)
Osmolality: 284 (ref 275–301)
Potassium: 3.8 mmol/L (ref 3.5–5.1)

## 2012-09-29 LAB — URINE CULTURE

## 2014-06-04 ENCOUNTER — Emergency Department: Payer: Self-pay | Admitting: Emergency Medicine

## 2014-06-04 LAB — COMPREHENSIVE METABOLIC PANEL
ALBUMIN: 4.1 g/dL (ref 3.4–5.0)
ALT: 23 U/L
ANION GAP: 10 (ref 7–16)
Alkaline Phosphatase: 78 U/L
BUN: 11 mg/dL (ref 7–18)
Bilirubin,Total: 0.6 mg/dL (ref 0.2–1.0)
CHLORIDE: 107 mmol/L (ref 98–107)
Calcium, Total: 9 mg/dL (ref 8.5–10.1)
Co2: 23 mmol/L (ref 21–32)
Creatinine: 0.8 mg/dL (ref 0.60–1.30)
Glucose: 97 mg/dL (ref 65–99)
Osmolality: 279 (ref 275–301)
Potassium: 3.9 mmol/L (ref 3.5–5.1)
SGOT(AST): 33 U/L (ref 15–37)
Sodium: 140 mmol/L (ref 136–145)
TOTAL PROTEIN: 7.9 g/dL (ref 6.4–8.2)

## 2014-06-04 LAB — CBC WITH DIFFERENTIAL/PLATELET
Basophil #: 0.1 10*3/uL (ref 0.0–0.1)
Basophil %: 0.5 %
Eosinophil #: 0.1 10*3/uL (ref 0.0–0.7)
Eosinophil %: 1 %
HCT: 41.4 % (ref 35.0–47.0)
HGB: 14.3 g/dL (ref 12.0–16.0)
LYMPHS ABS: 1.8 10*3/uL (ref 1.0–3.6)
Lymphocyte %: 17.3 %
MCH: 30.6 pg (ref 26.0–34.0)
MCHC: 34.5 g/dL (ref 32.0–36.0)
MCV: 89 fL (ref 80–100)
Monocyte #: 0.5 x10 3/mm (ref 0.2–0.9)
Monocyte %: 4.8 %
NEUTROS ABS: 8.1 10*3/uL — AB (ref 1.4–6.5)
Neutrophil %: 76.4 %
PLATELETS: 371 10*3/uL (ref 150–440)
RBC: 4.67 10*6/uL (ref 3.80–5.20)
RDW: 12.5 % (ref 11.5–14.5)
WBC: 10.6 10*3/uL (ref 3.6–11.0)

## 2014-06-04 LAB — URINALYSIS, COMPLETE
BLOOD: NEGATIVE
Bacteria: NONE SEEN
Bilirubin,UR: NEGATIVE
Glucose,UR: NEGATIVE mg/dL (ref 0–75)
Ketone: NEGATIVE
Leukocyte Esterase: NEGATIVE
Nitrite: NEGATIVE
Ph: 6 (ref 4.5–8.0)
Protein: NEGATIVE
SPECIFIC GRAVITY: 1.016 (ref 1.003–1.030)
Squamous Epithelial: 1

## 2014-06-04 LAB — LIPASE, BLOOD: Lipase: 54 U/L — ABNORMAL LOW (ref 73–393)

## 2014-06-04 LAB — TROPONIN I: Troponin-I: <0.02 ng/mL

## 2014-09-24 ENCOUNTER — Emergency Department: Payer: Self-pay | Admitting: Emergency Medicine

## 2014-09-24 LAB — COMPREHENSIVE METABOLIC PANEL
AST: 24 U/L (ref 15–37)
Albumin: 3.9 g/dL (ref 3.4–5.0)
Alkaline Phosphatase: 99 U/L
Anion Gap: 6 — ABNORMAL LOW (ref 7–16)
BUN: 10 mg/dL (ref 7–18)
Bilirubin,Total: 0.5 mg/dL (ref 0.2–1.0)
CREATININE: 0.77 mg/dL (ref 0.60–1.30)
Calcium, Total: 9.1 mg/dL (ref 8.5–10.1)
Chloride: 106 mmol/L (ref 98–107)
Co2: 26 mmol/L (ref 21–32)
EGFR (African American): >60
EGFR (Non-African Amer.): >60
Glucose: 95 mg/dL (ref 65–99)
OSMOLALITY: 275 (ref 275–301)
Potassium: 4.4 mmol/L (ref 3.5–5.1)
SGPT (ALT): 30 U/L
SODIUM: 138 mmol/L (ref 136–145)
Total Protein: 7.6 g/dL (ref 6.4–8.2)

## 2014-09-24 LAB — URINALYSIS, COMPLETE
BACTERIA: NONE SEEN
BILIRUBIN, UR: NEGATIVE
BLOOD: NEGATIVE
GLUCOSE, UR: NEGATIVE mg/dL (ref 0–75)
Leukocyte Esterase: NEGATIVE
Nitrite: NEGATIVE
Ph: 6 (ref 4.5–8.0)
Protein: NEGATIVE
RBC,UR: 1 /HPF (ref 0–5)
Specific Gravity: 1.016 (ref 1.003–1.030)
Squamous Epithelial: 1
WBC UR: <1 /HPF (ref 0–5)

## 2014-09-24 LAB — GC/CHLAMYDIA PROBE AMP

## 2014-09-24 LAB — CBC WITH DIFFERENTIAL/PLATELET
Basophil #: 0 10*3/uL (ref 0.0–0.1)
Basophil %: 0.4 %
EOS ABS: 0.1 10*3/uL (ref 0.0–0.7)
Eosinophil %: 1.3 %
HCT: 40.4 % (ref 35.0–47.0)
HGB: 13.5 g/dL (ref 12.0–16.0)
LYMPHS ABS: 2.1 10*3/uL (ref 1.0–3.6)
LYMPHS PCT: 24.6 %
MCH: 30 pg (ref 26.0–34.0)
MCHC: 33.4 g/dL (ref 32.0–36.0)
MCV: 90 fL (ref 80–100)
MONOS PCT: 5.9 %
Monocyte #: 0.5 x10 3/mm (ref 0.2–0.9)
NEUTROS PCT: 67.8 %
Neutrophil #: 5.7 10*3/uL (ref 1.4–6.5)
Platelet: 356 10*3/uL (ref 150–440)
RBC: 4.5 10*6/uL (ref 3.80–5.20)
RDW: 12.3 % (ref 11.5–14.5)
WBC: 8.4 10*3/uL (ref 3.6–11.0)

## 2014-09-24 LAB — LIPASE, BLOOD: LIPASE: 38 U/L — AB (ref 73–393)

## 2014-09-24 LAB — WET PREP, GENITAL

## 2014-11-12 ENCOUNTER — Ambulatory Visit: Payer: Self-pay | Admitting: Physician Assistant

## 2015-02-03 NOTE — Discharge Summary (Signed)
PATIENT NAME:  [name], [name] MR#:  [id number] DATE OF BIRTH:  October 19, 1980  DATE OF ADMISSION:  09/27/2012 DATE OF DISCHARGE:  09/29/2012  PRIMARY CARE PHYSICIAN: Maurine Minister.    DISCHARGE DIAGNOSES:  Accidental drug overdose, urinary tract infection.   HISTORY OF PRESENT ILLNESS: This 34 year old female presented to the Emergency Room via EMS with intentional overdose of the drugs that were resumed at the presentation. She was drowsy and arousable with stimuli, but was not able to provide any history.  The only information available was that she was taking clonazepam and Flexeril at home for anxiety and depression, and she had history of suicidal attempt in the past, so she was admitted with having possibility of suicidal attempt with drug overdose and for alcohol withdrawal.   LABORATORY: On arrival, urine for toxicology was positive for opiate and tricyclic antidepressant and MDMA.  Ethanol level was 206. Urinalysis was positive with positive nitrite. Acetaminophen level was less than 2, salicylate level was less than 1.7.   HOSPITAL COURSE:  As mentioned above, she was admitted with diagnosis of drug overdose, intentional, and suicidal attempt. The next day in the morning, in the ICU, she was more alert, oriented, and fully cooperative and we got further history. On asking her that, she said that she was staying with her husband, who had some issues, and she is suspecting that he might have mixed up her medication or mixed something else with that. She had an argument with him and so because of the upset she got herself drunk with alcohol and then, because of her pain and anxiety, she took some medication but she does not know how much and __________, and finally ended up in this situation in the Emergency Room and ICU, but she clearly denied having any suicidal thoughts and we called psychiatry consult. They also confirmed and cleared that she does not have any suicidal thought at this time and  she can be safely discharged once medically stable. She was transferred to the regular floor and observed for 1 more day for further monitoring. She was receiving treatment for her urinary tract infection with antibiotics. Care management consult was done to plan safety of discharge and the patient agreed to go to her parent's home as she is not feeling safe to go back to the home where her husband has the key of the apartment and she was discharged home.   OTHER IMPORTANT LAB RESULTS DURING THE HOSPITAL STAY: T3 RIA normal. Urine culture E. coli resistant to levofloxacin and ciprofloxacin, sensitive to cephalosporin and nitrofurantoin.   CONSULT: Psychiatry.   CONDITION ON DISCHARGE:  Stable.   CODE STATUS: Full code.   DISCHARGE MEDICATIONS:  Odansetron 4 mg every eight hours as needed for nausea, tramadol 50 mg oral tablet 2 times a day, and nitrofurantoin 50 mg oral capsule 4 times a day for 5 more days. Advised her to stop taking Naproxen, cyclobenzaprine, meclizine.   DIET: Advised regular.   TIMEFRAME TO FOLLOW UP: One to 2 weeks. She was advised to get an appointment with her psychiatric as was advised by inpatient psychiatry consult, also reassured if she is not able to make any appointment with the psychiatrist, then she can follow with Dr. Weber Cooks at [place], [address] [place], [place].   TOTAL TIME SPENT ON THIS DISCHARGE SUMMARY: 45 minutes.     ____________________________ Ceasar Lund Anselm Jungling, MD vgv:cs D: 09/30/2012 19:12:00 ET T: 10/01/2012 14:00:34 ET JOB#: [id number]  cc: Rosalio Macadamia  Carolin Guernsey, MD, <Dictator> Meredith Leeds, NP Vaughan Basta MD ELECTRONICALLY SIGNED 10/01/2012 23:01

## 2015-02-03 NOTE — H&P (Signed)
PATIENT NAME:  [name], [name] MR#:  [id number] DATE OF BIRTH:  Mar 05, 1981  DATE OF ADMISSION:  09/27/2012  PRIMARY CARE PHYSICIAN: Unknown.   CHIEF COMPLAINT: Overdose.   HISTORY OF PRESENT ILLNESS: A 34 year old female presented to the ER via EMS with intentional overdose. Patient is alert at times when she is stimulated, however, she is not at this time able to provide any kind of history. The aunt is in the room who cannot provide much history as well. Apparently patient is taking some clonazepam and Flexeril at home for anxiety and depression and she has had a suicidal attempt in the past.    REVIEW OF SYSTEMS: Unable to obtain from the patient, she is alert at times but mostly lethargic but does respond to stimuli.   PAST MEDICAL HISTORY: According to the medical chart. 1. Asthma. 2. Tachycardia. 3. Depression.  4. Hypertension.   PAST SURGICAL HISTORY: According to medical chart.  1. Surgery on shoulder.  2. Dilatation and curettage.  3. Hysterectomy.  4. C-section. 5. Appendectomy.   ALLERGIES: According to medical chart seafood, latex and penicillin causes swelling.   MEDICATIONS: Unknown at this time.   FAMILY HISTORY: Unknown.   SOCIAL HISTORY: Patient has alcohol in her system. It is unknown if she ever smoked.   PHYSICAL EXAMINATION:  VITAL SIGNS: Temperature 97.6, pulse 87, respirations 18, blood pressure 132/84, 100% on 2 liters.   GENERAL: Patient awakes ti stimuli. She opens her eyes spontaneously. She did receive Narcan in the Emergency Room.   HEENT: Head is atraumatic. Pupils are round, reactive. Sclerae anicteric. She has a left nasal trumpet placed. Oropharynx is clear. No biting the tongue.   NECK: Supple with no appreciable thyromegaly.   CARDIOVASCULAR: Regular rate and rhythm. No murmur, gallops, rubs. PMI is not displaced.    LUNGS: Clear to auscultation without crackles, rales, rhonchi or wheezing.   ABDOMEN: Bowel sounds are positive.  Nontender, nondistended. No hepatosplenomegaly.   EXTREMITIES: No clubbing, cyanosis, edema.    NEUROLOGIC: Hard to do a neurological exam but the Babinski sign is negative. Hard to do a neurological exam because the patient actually is not cooperative for the exam.   SKIN: Shows no obvious scratches.  LABORATORY, DIAGNOSTIC AND RADIOLOGICAL DATA: UDS is positive for TCAs as well as MDMA and opiates. Urinalysis shows positive nitrite, no leukocyte esterase and only 2 white blood cells with trace bacteria. Sodium 141, potassium 3.3, chloride 109, bicarbonate 23, BUN 6, creatinine 0.76, glucose 77, calcium 8.8, bilirubin 0.5, alkaline phosphatase 105, ALT 39, AST 42, total protein 7.4, albumin 4.2, Tylenol level less than 2. CK 64. CPK-MB 0.7. Alcohol level 206, white blood cells 7.3, hemoglobin 13, hematocrit 38, platelets 761, salicylates less than 1.7. Troponin less than 0.02. TSH 8.46. EKG at this time is still pending.   ASSESSMENT AND PLAN: 34 year old female who presents with intentional overdose.  1. Intentional overdose, unclear what medications. Patient does not need to be intubated at this time. She is responsive. Continue monitoring in the Critical Care Unit. Supportive care at this time.  2. Alcohol intoxication. Will place the patient on CIWA protocol.  3. Elevated TSH. Will check T4 and T3.  4. Mild urinary tract infection with positive nitrites. Will start Levaquin as the patient is allergic to penicillin and check urine culture. 5. Hypokalemia. Will replete and recheck in the a.m.  6. Patient will need a sitter. She will need a psychiatry consult once she is able to provide history.  TIME SPENT: Approximately 40 minutes.   ____________________________ Donell Beers. Benjie Karvonen, MD spm:cms D: 09/27/2012 21:37:26 ET T: 09/28/2012 05:13:09 ET JOB#: [id number]  cc: Stan Cantave P. Benjie Karvonen, MD, <Dictator> Donell Beers Obryan Radu MD ELECTRONICALLY SIGNED 09/28/2012 14:03
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
## 2015-02-03 NOTE — Consult Note (Signed)
Brief Consult Note: Diagnosis: adjustment disorder.   Patient was seen by consultant.   Consult note dictated.   Orders entered.   Comments: Psychiatry: Patient seen and chart reviewed. Consult dictated. Currently denies SI. Sounds like an accidental overdose from impulsive drinking. Currently calm and lucid. Advise DC of sitter. Also advise patient have follow up psych treatment with [facility] on [address], [phone]. Gave her this information, too.  Electronic Signatures: Clapacs, Madie Reno (MD)  (Signed 13-Dec-13 17:04)  Authored: Brief Consult Note   Last Updated: 13-Dec-13 17:04 by Gonzella Lex (MD)

## 2015-06-23 ENCOUNTER — Other Ambulatory Visit: Payer: Self-pay | Admitting: Physician Assistant

## 2015-06-23 DIAGNOSIS — M5442 Lumbago with sciatica, left side: Secondary | ICD-10-CM

## 2015-06-23 DIAGNOSIS — R200 Anesthesia of skin: Secondary | ICD-10-CM

## 2015-06-23 DIAGNOSIS — M5441 Lumbago with sciatica, right side: Secondary | ICD-10-CM

## 2015-06-26 ENCOUNTER — Other Ambulatory Visit: Payer: Self-pay | Admitting: Physician Assistant

## 2015-06-26 DIAGNOSIS — M4854XA Collapsed vertebra, not elsewhere classified, thoracic region, initial encounter for fracture: Secondary | ICD-10-CM

## 2015-06-26 DIAGNOSIS — M5442 Lumbago with sciatica, left side: Secondary | ICD-10-CM

## 2015-06-26 DIAGNOSIS — R200 Anesthesia of skin: Secondary | ICD-10-CM

## 2015-06-26 DIAGNOSIS — M5441 Lumbago with sciatica, right side: Secondary | ICD-10-CM

## 2015-06-29 ENCOUNTER — Ambulatory Visit: Admission: RE | Admit: 2015-06-29 | Payer: BLUE CROSS/BLUE SHIELD | Source: Ambulatory Visit

## 2015-06-29 ENCOUNTER — Ambulatory Visit
Admission: RE | Admit: 2015-06-29 | Discharge: 2015-06-29 | Disposition: A | Discharge status: Home / Self Care | Payer: BLUE CROSS/BLUE SHIELD | Source: Ambulatory Visit | Attending: Physician Assistant | Admitting: Physician Assistant

## 2015-06-29 DIAGNOSIS — N8320 Unspecified ovarian cysts: Secondary | ICD-10-CM | POA: Insufficient documentation

## 2015-06-29 DIAGNOSIS — M5126 Other intervertebral disc displacement, lumbar region: Secondary | ICD-10-CM | POA: Insufficient documentation

## 2015-06-29 DIAGNOSIS — R200 Anesthesia of skin: Secondary | ICD-10-CM | POA: Diagnosis present

## 2015-06-29 DIAGNOSIS — M5441 Lumbago with sciatica, right side: Secondary | ICD-10-CM

## 2015-06-29 DIAGNOSIS — M545 Low back pain: Secondary | ICD-10-CM | POA: Diagnosis present

## 2015-06-29 DIAGNOSIS — M5442 Lumbago with sciatica, left side: Secondary | ICD-10-CM

## 2015-06-29 DIAGNOSIS — M4854XA Collapsed vertebra, not elsewhere classified, thoracic region, initial encounter for fracture: Secondary | ICD-10-CM

## 2015-08-21 ENCOUNTER — Ambulatory Visit
Admission: EM | Admit: 2015-08-21 | Discharge: 2015-08-21 | Disposition: A | Discharge status: Home / Self Care | Payer: BLUE CROSS/BLUE SHIELD | Attending: Internal Medicine | Admitting: Internal Medicine

## 2015-08-21 DIAGNOSIS — J029 Acute pharyngitis, unspecified: Secondary | ICD-10-CM | POA: Diagnosis not present

## 2015-08-21 DIAGNOSIS — B349 Viral infection, unspecified: Secondary | ICD-10-CM | POA: Diagnosis not present

## 2015-08-21 HISTORY — DX: Depression, unspecified: F32.A

## 2015-08-21 HISTORY — DX: Major depressive disorder, single episode, unspecified: F32.9

## 2015-08-21 HISTORY — DX: Unspecified asthma, uncomplicated: J45.909

## 2015-08-21 HISTORY — DX: Anxiety disorder, unspecified: F41.9

## 2015-08-21 LAB — URINALYSIS COMPLETE WITH MICROSCOPIC (ARMC ONLY)
Bilirubin Urine: NEGATIVE
Glucose, UA: NEGATIVE mg/dL
Hgb urine dipstick: NEGATIVE
Ketones, ur: NEGATIVE mg/dL
Leukocytes, UA: NEGATIVE
Nitrite: NEGATIVE
Protein, ur: NEGATIVE mg/dL
RBC / HPF: NONE SEEN RBC/hpf (ref <3)
Specific Gravity, Urine: 1.01 (ref 1.005–1.030)
pH: 5.5 (ref 5.0–8.0)

## 2015-08-21 LAB — RAPID STREP SCREEN (MED CTR MEBANE ONLY): Streptococcus, Group A Screen (Direct): NEGATIVE

## 2015-08-21 MED ORDER — HYDROCOD POLST-CPM POLST ER 10-8 MG/5ML PO SUER: 5.0000 mL | Freq: Two times a day (BID) | ORAL | Status: DC

## 2015-08-21 MED ORDER — NAPROXEN 500 MG PO TABS: 500.0000 mg | ORAL_TABLET | Freq: Two times a day (BID) | ORAL | Status: DC
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
## 2015-08-21 NOTE — Discharge Instructions (Signed)

## 2015-08-21 NOTE — ED Notes (Signed)
Generalized body/muscle aches started yesterday. No meds today.  Daughter was sick with stomach issues about 2 weeks ago.    Back pain from fall.  Fever.   Unknown influenza immunization this season.  Also, reports some vag bleeding (Spotting) last week.

## 2015-08-23 ENCOUNTER — Telehealth: Payer: Self-pay | Admitting: Internal Medicine

## 2015-08-23 DIAGNOSIS — J020 Streptococcal pharyngitis: Secondary | ICD-10-CM

## 2015-08-23 LAB — CULTURE, GROUP A STREP (THRC)

## 2015-08-23 LAB — URINE CULTURE: Special Requests: NORMAL

## 2015-08-23 MED ORDER — AZITHROMYCIN 250 MG PO TABS: 250.0000 mg | ORAL_TABLET | Freq: Every day | ORAL | Status: DC

## 2015-08-23 NOTE — ED Notes (Signed)
Throat culture obtained 08/21/15 growing beta hemolytic organism; rx zithromax sent to [facility] in [place] today.  Will ask clinical staff to let patient know to pick up rx.  Sherlene Shams, MD 08/23/15 [phone]

## 2015-08-24 NOTE — ED Notes (Signed)
Called and left detailed VM on patient cell number to pick up Rx and start as soon as she can

## 2015-08-24 NOTE — ED Notes (Signed)
Call placed to patient, left detailed  message on cell number VM to pick up Rx at store listed as store of choice when seen at [facility]

## 2016-02-10 ENCOUNTER — Other Ambulatory Visit: Payer: Self-pay | Admitting: Internal Medicine

## 2016-02-10 DIAGNOSIS — N63 Unspecified lump in unspecified breast: Secondary | ICD-10-CM

## 2016-02-16 ENCOUNTER — Other Ambulatory Visit: Payer: Self-pay | Admitting: Internal Medicine

## 2016-02-16 DIAGNOSIS — N63 Unspecified lump in unspecified breast: Secondary | ICD-10-CM

## 2016-02-29 ENCOUNTER — Ambulatory Visit
Admission: RE | Admit: 2016-02-29 | Discharge: 2016-02-29 | Disposition: A | Discharge status: Home / Self Care | Payer: Medicaid Other | Source: Ambulatory Visit | Attending: Internal Medicine | Admitting: Internal Medicine

## 2016-02-29 ENCOUNTER — Other Ambulatory Visit: Payer: Self-pay | Admitting: Internal Medicine

## 2016-02-29 DIAGNOSIS — N6002 Solitary cyst of left breast: Secondary | ICD-10-CM | POA: Diagnosis not present

## 2016-02-29 DIAGNOSIS — N63 Unspecified lump in unspecified breast: Secondary | ICD-10-CM

## 2016-06-09 ENCOUNTER — Other Ambulatory Visit: Payer: Self-pay | Admitting: Surgery

## 2016-06-09 DIAGNOSIS — N6012 Diffuse cystic mastopathy of left breast: Secondary | ICD-10-CM

## 2016-06-27 ENCOUNTER — Ambulatory Visit
Admission: RE | Admit: 2016-06-27 | Discharge: 2016-06-27 | Disposition: A | Discharge status: Home / Self Care | Payer: Medicaid Other | Source: Ambulatory Visit | Attending: Surgery | Admitting: Surgery

## 2016-06-27 DIAGNOSIS — N6012 Diffuse cystic mastopathy of left breast: Secondary | ICD-10-CM | POA: Insufficient documentation

## 2016-09-10 ENCOUNTER — Encounter: Payer: Self-pay | Admitting: Emergency Medicine

## 2016-09-10 ENCOUNTER — Emergency Department
Admission: EM | Admit: 2016-09-10 | Discharge: 2016-09-10 | Disposition: A | Discharge status: Home / Self Care | Payer: Medicaid Other | Attending: Emergency Medicine | Admitting: Emergency Medicine

## 2016-09-10 DIAGNOSIS — Y990 Civilian activity done for income or pay: Secondary | ICD-10-CM | POA: Diagnosis not present

## 2016-09-10 DIAGNOSIS — A084 Viral intestinal infection, unspecified: Secondary | ICD-10-CM | POA: Insufficient documentation

## 2016-09-10 DIAGNOSIS — B349 Viral infection, unspecified: Secondary | ICD-10-CM

## 2016-09-10 DIAGNOSIS — K529 Noninfective gastroenteritis and colitis, unspecified: Secondary | ICD-10-CM

## 2016-09-10 DIAGNOSIS — X58XXXA Exposure to other specified factors, initial encounter: Secondary | ICD-10-CM | POA: Diagnosis not present

## 2016-09-10 DIAGNOSIS — Y9389 Activity, other specified: Secondary | ICD-10-CM | POA: Insufficient documentation

## 2016-09-10 DIAGNOSIS — E860 Dehydration: Secondary | ICD-10-CM | POA: Insufficient documentation

## 2016-09-10 DIAGNOSIS — Z91040 Latex allergy status: Secondary | ICD-10-CM | POA: Diagnosis not present

## 2016-09-10 DIAGNOSIS — J45909 Unspecified asthma, uncomplicated: Secondary | ICD-10-CM | POA: Diagnosis not present

## 2016-09-10 DIAGNOSIS — Z79899 Other long term (current) drug therapy: Secondary | ICD-10-CM | POA: Diagnosis not present

## 2016-09-10 DIAGNOSIS — Y929 Unspecified place or not applicable: Secondary | ICD-10-CM | POA: Diagnosis not present

## 2016-09-10 DIAGNOSIS — S3992XA Unspecified injury of lower back, initial encounter: Secondary | ICD-10-CM | POA: Diagnosis present

## 2016-09-10 DIAGNOSIS — S39012A Strain of muscle, fascia and tendon of lower back, initial encounter: Secondary | ICD-10-CM | POA: Diagnosis not present

## 2016-09-10 HISTORY — DX: Other chronic pain: G89.29

## 2016-09-10 HISTORY — DX: Other chronic pain: M54.9

## 2016-09-10 MED ORDER — PSEUDOEPH-BROMPHEN-DM 30-2-10 MG/5ML PO SYRP: 10.0000 mL | ORAL_SOLUTION | Freq: Four times a day (QID) | ORAL | 0 refills | Status: DC | PRN

## 2016-09-10 MED ORDER — ONDANSETRON 4 MG PO TBDP: 4.0000 mg | ORAL_TABLET | Freq: Three times a day (TID) | ORAL | 0 refills | Status: DC | PRN

## 2016-09-10 MED ORDER — CYCLOBENZAPRINE HCL 10 MG PO TABS: 10.0000 mg | ORAL_TABLET | Freq: Three times a day (TID) | ORAL | 0 refills | Status: DC | PRN

## 2016-09-10 NOTE — ED Provider Notes (Signed)
[facility] Emergency Department Provider Note  ____________________________________________  Time seen: Approximately 2:47 PM  I have reviewed the triage vital signs and the nursing notes.   HISTORY  Chief Complaint Generalized Body Aches    HPI [name] is a 35 y.o. female who presents emergency department complaining of multiple medical complaints.Patient states over the last several days she has had nasal congestion, sore throat, coughing, nausea and vomiting, generalized aches. Patient states that she has also had an intermittent low-grade fever. Patient has not tried any medications over-the-counter for this complaint. Patient denies any headache, visual changes, neck pain, chest pain, shortness of breath, diarrhea or constipation. Patient denies any blood in emesis. She denies any bile and emesis. She denies trying any new foods. Patient also reports increased lower back pain. She states that this is secondary to "moving people at work." She denies any cell anesthesia, paresthesias, bowel or bladder dysfunction.  She works as a Quarry manager and states that she has been around multiple "sick people" recently. Patient states that she has had flow before the symptoms are not consistent. Patient also has had flu shot this year.   Past Medical History:  Diagnosis Date  . Anxiety   . Asthma   . Chronic back pain   . Depression     There are no active problems to display for this patient.   Past Surgical History:  Procedure Laterality Date  . ABDOMINAL HYSTERECTOMY    . APPENDECTOMY    . CESAREAN SECTION      Prior to Admission medications   Medication Sig Start Date End Date Taking? Authorizing Provider  buPROPion (WELLBUTRIN XL) 300 MG 24 hr tablet Take 300 mg by mouth daily.   Yes Historical Provider, MD  gabapentin (NEURONTIN) 300 MG capsule Take 300 mg by mouth at bedtime.   Yes Historical Provider, MD  HYDROcodone-acetaminophen  (NORCO/VICODIN) 5-325 MG tablet Take 1 tablet by mouth every 6 (six) hours as needed for moderate pain.   Yes Historical Provider, MD  sertraline (ZOLOFT) 100 MG tablet Take 200 mg by mouth daily.   Yes Historical Provider, MD  trazodone (DESYREL) 300 MG tablet Take 300 mg by mouth at bedtime.   Yes Historical Provider, MD  brompheniramine-pseudoephedrine-DM 30-2-10 MG/5ML syrup Take 10 mLs by mouth 4 (four) times daily as needed. 09/10/16   Charline Bills Cuthriell, PA-C  cyclobenzaprine (FLEXERIL) 10 MG tablet Take 1 tablet (10 mg total) by mouth 3 (three) times daily as needed for muscle spasms. 09/10/16   Charline Bills Cuthriell, PA-C  ondansetron (ZOFRAN-ODT) 4 MG disintegrating tablet Take 1 tablet (4 mg total) by mouth every 8 (eight) hours as needed for nausea or vomiting. 09/10/16   Charline Bills Cuthriell, PA-C    Allergies Bee venom; Fish allergy; Latex; and Penicillins  Family History  Problem Relation Age of Onset  . Irritable bowel syndrome Mother   . Anxiety disorder Mother   . CAD Father   . Breast cancer Maternal Aunt 42    Social History Social History  Substance Use Topics  . Smoking status: Never Smoker  . Smokeless tobacco: Never Used  . Alcohol use Yes     Comment: rarely     Review of Systems  Constitutional: Intermittent low-grade fever/chills Eyes: No visual changes. No discharge ENT: As above for nasal congestion. Positive for scratchy throat. Cardiovascular: no chest pain. Respiratory: Positive cough. No SOB. Gastrointestinal: Positive for epigastric pain. No other abdominal pain.  Positive for nausea and  vomiting.  No diarrhea.  No constipation. Musculoskeletal: Positive for lower back pain. Positive for generalized myalgias. Skin: Negative for rash, abrasions, lacerations, ecchymosis. Neurological: Negative for headaches, focal weakness or numbness. 10-point ROS otherwise negative.  ____________________________________________   PHYSICAL EXAM:  VITAL  SIGNS: ED Triage Vitals  Enc Vitals Group     BP 09/10/16 1338 117/83     Pulse Rate 09/10/16 1338 100     Resp 09/10/16 1338 20     Temp 09/10/16 1338 98.6 F (37 C)     Temp Source 09/10/16 1338 Oral     SpO2 09/10/16 1338 97 %     Weight 09/10/16 1336 190 lb (86.2 kg)     Height 09/10/16 1336 5\' 7"  (1.702 m)     Head Circumference --      Peak Flow --      Pain Score 09/10/16 1336 8     Pain Loc --      Pain Edu? --      Excl. in [place]? --      Constitutional: Alert and oriented. Well appearing and in no acute distress. Eyes: Conjunctivae are normal. PERRL. EOMI. Head: Atraumatic. ENT:      Ears: EACs and TMs are unremarkable bilaterally.      Nose: Moderate clear congestion/rhinnorhea. Tenderness to percussion over the sinuses.      Mouth/Throat: Mucous membranes are moist.  Neck: No stridor. Neck is supple with full range of motion Hematological/Lymphatic/Immunilogical: No cervical lymphadenopathy. Cardiovascular: Normal rate, regular rhythm. Normal S1 and S2.  Good peripheral circulation. Respiratory: Normal respiratory effort without tachypnea or retractions. Lungs CTAB. Good air entry to the bases with no decreased or absent breath sounds. Gastrointestinal: Bowel sounds 4 quadrants. Soft and nontender to palpation. No guarding or rigidity. No palpable masses. No distention. No CVA tenderness. Musculoskeletal: Full range of motion to all extremities. No gross deformities appreciated. No visible deformities noted to spine but inspection. Full range of motion to spine. No palpable abnormality. No point tenderness. Patient is tender to palpation over paraspinal muscle groups greater on left than right. Mild spasms are appreciated to palpation. No tenderness to palpation over bilateral sciatic notches. Dorsalis pedis pulse intact bilaterally show his. Sensation intact and equal lower extremities. Neurologic:  Normal speech and language. No gross focal neurologic deficits are  appreciated.  Skin:  Skin is warm, dry and intact. No rash noted. Psychiatric: Mood and affect are normal. Speech and behavior are normal. Patient exhibits appropriate insight and judgement.   ____________________________________________   LABS (all labs ordered are listed, but only abnormal results are displayed)  Labs Reviewed - No data to display ____________________________________________  EKG   ____________________________________________  RADIOLOGY   No results found.  ____________________________________________    PROCEDURES  Procedure(s) performed:    Procedures    Medications - No data to display   ____________________________________________   INITIAL IMPRESSION / ASSESSMENT AND PLAN / ED COURSE  Pertinent labs & imaging results that were available during my care of the patient were reviewed by me and considered in my medical decision making (see chart for details).  Review of the [place] CSRS was performed in accordance of the [place] prior to dispensing any controlled drugs.  Clinical Course     Patient's diagnosis is consistent with Viral illness with mild viral gastroenteritis. Patient also has some mild lumbar muscle strain. At this time there is no indication for imaging or labs.. Patient will be discharged home with prescriptions for vacation for  symptom control. Patient is to follow up with primary care as needed or otherwise directed. Patient is given ED precautions to return to the ED for any worsening or new symptoms.     ____________________________________________  FINAL CLINICAL IMPRESSION(S) / ED DIAGNOSES  Final diagnoses:  Viral illness  Gastroenteritis  Strain of lumbar region, initial encounter  Mild dehydration      NEW MEDICATIONS STARTED DURING THIS VISIT:  New Prescriptions   BROMPHENIRAMINE-PSEUDOEPHEDRINE-DM 30-2-10 MG/5ML SYRUP    Take 10 mLs by mouth 4 (four) times daily as needed.   CYCLOBENZAPRINE (FLEXERIL) 10  MG TABLET    Take 1 tablet (10 mg total) by mouth 3 (three) times daily as needed for muscle spasms.   ONDANSETRON (ZOFRAN-ODT) 4 MG DISINTEGRATING TABLET    Take 1 tablet (4 mg total) by mouth every 8 (eight) hours as needed for nausea or vomiting.        This chart was dictated using voice recognition software/Dragon. Despite best efforts to proofread, errors can occur which can change the meaning. Any change was purely unintentional.    Darletta Moll, PA-C 09/10/16 1518    Earleen Newport, MD 09/10/16 [phone]

## 2016-09-10 NOTE — ED Triage Notes (Signed)
States she developed generalized body aches   Intermittent fever  Positive n/v  Yesterday

## 2016-09-19 DIAGNOSIS — E538 Deficiency of other specified B group vitamins: Secondary | ICD-10-CM | POA: Insufficient documentation

## 2016-10-21 ENCOUNTER — Encounter: Payer: Self-pay | Admitting: Emergency Medicine

## 2016-10-21 ENCOUNTER — Emergency Department
Admission: EM | Admit: 2016-10-21 | Discharge: 2016-10-21 | Disposition: A | Discharge status: Home / Self Care | Payer: Medicaid Other | Attending: Emergency Medicine | Admitting: Emergency Medicine

## 2016-10-21 DIAGNOSIS — J029 Acute pharyngitis, unspecified: Secondary | ICD-10-CM | POA: Insufficient documentation

## 2016-10-21 DIAGNOSIS — R05 Cough: Secondary | ICD-10-CM | POA: Insufficient documentation

## 2016-10-21 DIAGNOSIS — N390 Urinary tract infection, site not specified: Secondary | ICD-10-CM | POA: Insufficient documentation

## 2016-10-21 DIAGNOSIS — J45909 Unspecified asthma, uncomplicated: Secondary | ICD-10-CM | POA: Insufficient documentation

## 2016-10-21 DIAGNOSIS — M545 Low back pain: Secondary | ICD-10-CM | POA: Diagnosis present

## 2016-10-21 LAB — URINALYSIS, COMPLETE (UACMP) WITH MICROSCOPIC
Bilirubin Urine: NEGATIVE
GLUCOSE, UA: NEGATIVE mg/dL
Hgb urine dipstick: NEGATIVE
KETONES UR: NEGATIVE mg/dL
Nitrite: POSITIVE — AB
PROTEIN: 30 mg/dL — AB
RBC / HPF: NONE SEEN RBC/hpf (ref 0–5)
Specific Gravity, Urine: 1.026 (ref 1.005–1.030)
pH: 5 (ref 5.0–8.0)

## 2016-10-21 LAB — RAPID INFLUENZA A&B ANTIGENS
Influenza A (ARMC): NEGATIVE
Influenza B (ARMC): NEGATIVE

## 2016-10-21 MED ORDER — SULFAMETHOXAZOLE-TRIMETHOPRIM 800-160 MG PO TABS: 1.0000 | ORAL_TABLET | Freq: Two times a day (BID) | ORAL | 0 refills | Status: DC

## 2016-10-21 NOTE — ED Notes (Signed)
See triage note   States she began not feeling well on Monday  Weakness  Cough 2 days ago  Unsure of fever denies any n/v/d. But now having sore throat this am

## 2016-10-21 NOTE — ED Triage Notes (Signed)
Cough, bodyaches, sore throat.

## 2016-10-21 NOTE — ED Provider Notes (Signed)
[facility] Emergency Department Provider Note  ____________________________________________   First MD Initiated Contact with Patient 10/21/16 1202     (approximate)  I have reviewed the triage vital signs and the nursing notes.   HISTORY  Chief Complaint Cough   HPI [name] is a 36 y.o. female who complaint of cough, body aches and sore throat. Patient states that she has not felt well for the last 2 days. She also complains of back pain. She denies any urinary symptoms. There is been no nausea, vomiting, or diarrhea. Patient continues to eat and drink although she has decreased appetite. Patient is unsure of any exposure to the flu. Currently she rates her pain as 8/10.   Past Medical History:  Diagnosis Date  . Anxiety   . Asthma   . Chronic back pain   . Depression     There are no active problems to display for this patient.   Past Surgical History:  Procedure Laterality Date  . ABDOMINAL HYSTERECTOMY    . APPENDECTOMY    . CESAREAN SECTION      Prior to Admission medications   Medication Sig Start Date End Date Taking? Authorizing Provider  buPROPion (WELLBUTRIN XL) 300 MG 24 hr tablet Take 300 mg by mouth daily.    Historical Provider, MD  gabapentin (NEURONTIN) 300 MG capsule Take 300 mg by mouth at bedtime.    Historical Provider, MD  sertraline (ZOLOFT) 100 MG tablet Take 200 mg by mouth daily.    Historical Provider, MD  sulfamethoxazole-trimethoprim (BACTRIM DS,SEPTRA DS) 800-160 MG tablet Take 1 tablet by mouth 2 (two) times daily. 10/21/16   Johnn Hai, PA-C  trazodone (DESYREL) 300 MG tablet Take 300 mg by mouth at bedtime.    Historical Provider, MD    Allergies Bee venom; Fish allergy; Latex; and Penicillins  Family History  Problem Relation Age of Onset  . Irritable bowel syndrome Mother   . Anxiety disorder Mother   . CAD Father   . Breast cancer Maternal Aunt 42    Social History Social History   Substance Use Topics  . Smoking status: Never Smoker  . Smokeless tobacco: Never Used  . Alcohol use Yes     Comment: rarely    Review of Systems Constitutional: A unaware fever/chills Eyes: No visual changes. ENT: Positive sore throat. Cardiovascular: Denies chest pain. Respiratory: Denies shortness of breath. Positive cough. Gastrointestinal: No abdominal pain.  No nausea, no vomiting.  No diarrhea.  No constipation. Genitourinary: Negative for dysuria. Musculoskeletal:  Positive generalized body aches. Positive low back pain. Skin: Negative for rash. Neurological: Negative for headaches, focal weakness or numbness.  10-point ROS otherwise negative.  ____________________________________________   PHYSICAL EXAM:  VITAL SIGNS: ED Triage Vitals  Enc Vitals Group     BP 10/21/16 1127 124/82     Pulse Rate 10/21/16 1127 95     Resp 10/21/16 1127 18     Temp 10/21/16 1127 97.5 F (36.4 C)     Temp Source 10/21/16 1127 Oral     SpO2 10/21/16 1127 100 %     Weight 10/21/16 1128 106 lb (48.1 kg)     Height 10/21/16 1128 5\' 7"  (1.702 m)     Head Circumference --      Peak Flow --      Pain Score 10/21/16 1129 8     Pain Loc --      Pain Edu? --  Excl. in [place]? --     Constitutional: Alert and oriented. Well appearing and in no acute distress. Eyes: Conjunctivae are normal. PERRL. EOMI. Head: Atraumatic. Nose: No congestion/rhinnorhea. Mouth/Throat: Mucous membranes are moist.  Oropharynx non-erythematous.No exudate noted. Neck: No stridor.   Hematological/Lymphatic/Immunilogical: No cervical lymphadenopathy. Cardiovascular: Normal rate, regular rhythm. Grossly normal heart sounds.  Good peripheral circulation. Respiratory: Normal respiratory effort.  No retractions. Lungs CTAB. Gastrointestinal: Soft and nontender. No distention. Bowel sounds normoactive 4 quadrants. Musculoskeletal: Moves upper and lower extremities without difficulty. There is some tenderness  nonlocalized lower back. No CVA tenderness was noted. Neurologic:  Normal speech and language. No gross focal neurologic deficits are appreciated. No gait instability. Skin:  Skin is warm, dry and intact. No rash noted. Psychiatric: Mood and affect are normal. Speech and behavior are normal.  ____________________________________________   LABS (all labs ordered are listed, but only abnormal results are displayed)  Labs Reviewed  URINALYSIS, COMPLETE (UACMP) WITH MICROSCOPIC - Abnormal; Notable for the following:       Result Value   Color, Urine AMBER (*)    APPearance HAZY (*)    Protein, ur 30 (*)    Nitrite POSITIVE (*)    Leukocytes, UA LARGE (*)    Bacteria, UA MANY (*)    Squamous Epithelial / LPF 0-5 (*)    All other components within normal limits  RAPID INFLUENZA A&B ANTIGENS ([facility] ONLY)    PROCEDURES  Procedure(s) performed: None  Procedures  Critical Care performed: No  ____________________________________________   INITIAL IMPRESSION / ASSESSMENT AND PLAN / ED COURSE  Pertinent labs & imaging results that were available during my care of the patient were reviewed by me and considered in my medical decision making (see chart for details).    Clinical Course    Patient was made aware that influenza test was negative. However her urinalysis did show WBC too numerous to count with many bacteria and positive nitrites. Patient was placed on Bactrim DS twice a day for 10 days. She is encouraged to increase fluids. She'll also take Tylenol or ibuprofen for fever or body aches. She is to follow-up with her primary care doctor if any continued problems or return to the emergency room if any worsening of her symptoms or inability to take her antibiotic.  ____________________________________________   FINAL CLINICAL IMPRESSION(S) / ED DIAGNOSES  Final diagnoses:  Acute urinary tract infection      NEW MEDICATIONS STARTED DURING THIS VISIT:  Discharge  Medication List as of 10/21/2016  1:28 PM    START taking these medications   Details  sulfamethoxazole-trimethoprim (BACTRIM DS,SEPTRA DS) 800-160 MG tablet Take 1 tablet by mouth 2 (two) times daily., Starting Fri 10/21/2016, Print         Note:  This document was prepared using Dragon voice recognition software and may include unintentional dictation errors.    Johnn Hai, PA-C 10/21/16 [place], MD 10/21/16 [phone]

## 2016-10-21 NOTE — Discharge Instructions (Signed)
Follow-up with your primary care doctor if any continued problems. Increase fluids. Take all of the antibiotics for the next 10 days. Return to the emergency room if any severe worsening of your symptoms such as inability to take your antibiotic, fever not controlled with over-the-counter medication or vomiting.

## 2016-12-16 ENCOUNTER — Emergency Department (HOSPITAL_COMMUNITY): Payer: Medicaid Other

## 2016-12-16 ENCOUNTER — Emergency Department (HOSPITAL_COMMUNITY)
Admission: EM | Admit: 2016-12-16 | Discharge: 2016-12-16 | Disposition: A | Discharge status: Home / Self Care | Payer: Medicaid Other | Attending: Emergency Medicine | Admitting: Emergency Medicine

## 2016-12-16 ENCOUNTER — Encounter (HOSPITAL_COMMUNITY): Payer: Self-pay | Admitting: Emergency Medicine

## 2016-12-16 DIAGNOSIS — J45909 Unspecified asthma, uncomplicated: Secondary | ICD-10-CM | POA: Diagnosis not present

## 2016-12-16 DIAGNOSIS — R531 Weakness: Secondary | ICD-10-CM | POA: Insufficient documentation

## 2016-12-16 DIAGNOSIS — Z91040 Latex allergy status: Secondary | ICD-10-CM | POA: Insufficient documentation

## 2016-12-16 DIAGNOSIS — R55 Syncope and collapse: Secondary | ICD-10-CM | POA: Diagnosis not present

## 2016-12-16 DIAGNOSIS — R911 Solitary pulmonary nodule: Secondary | ICD-10-CM | POA: Insufficient documentation

## 2016-12-16 DIAGNOSIS — Z79899 Other long term (current) drug therapy: Secondary | ICD-10-CM | POA: Insufficient documentation

## 2016-12-16 LAB — URINALYSIS, ROUTINE W REFLEX MICROSCOPIC
Bilirubin Urine: NEGATIVE
Glucose, UA: NEGATIVE mg/dL
HGB URINE DIPSTICK: NEGATIVE
Ketones, ur: NEGATIVE mg/dL
Leukocytes, UA: NEGATIVE
NITRITE: NEGATIVE
PROTEIN: NEGATIVE mg/dL
Specific Gravity, Urine: 1.012 (ref 1.005–1.030)
pH: 7 (ref 5.0–8.0)

## 2016-12-16 LAB — BASIC METABOLIC PANEL
ANION GAP: 4 — AB (ref 5–15)
BUN: 13 mg/dL (ref 6–20)
CO2: 27 mmol/L (ref 22–32)
Calcium: 9.3 mg/dL (ref 8.9–10.3)
Chloride: 104 mmol/L (ref 101–111)
Creatinine, Ser: 0.67 mg/dL (ref 0.44–1.00)
GFR calc Af Amer: >60 mL/min (ref >60)
GFR calc non Af Amer: >60 mL/min (ref >60)
GLUCOSE: 92 mg/dL (ref 65–99)
POTASSIUM: 4.1 mmol/L (ref 3.5–5.1)
Sodium: 135 mmol/L (ref 135–145)

## 2016-12-16 LAB — CBC
HEMATOCRIT: 35.7 % — AB (ref 36.0–46.0)
HEMOGLOBIN: 12.1 g/dL (ref 12.0–15.0)
MCH: 29.6 pg (ref 26.0–34.0)
MCHC: 33.9 g/dL (ref 30.0–36.0)
MCV: 87.3 fL (ref 78.0–100.0)
Platelets: 297 10*3/uL (ref 150–400)
RBC: 4.09 MIL/uL (ref 3.87–5.11)
RDW: 12.9 % (ref 11.5–15.5)
WBC: 7.2 10*3/uL (ref 4.0–10.5)

## 2016-12-16 LAB — CBG MONITORING, ED: GLUCOSE-CAPILLARY: 84 mg/dL (ref 65–99)

## 2016-12-16 LAB — I-STAT TROPONIN, ED: Troponin i, poc: 0 ng/mL (ref 0.00–0.08)

## 2016-12-16 MED ORDER — SODIUM CHLORIDE 0.9 % IV SOLN
Freq: Once | INTRAVENOUS | Status: AC
  Administered 2016-12-16: 12:00:00 via INTRAVENOUS

## 2016-12-16 NOTE — Discharge Instructions (Signed)
See your Physician for recheck on Monday if not improved 

## 2016-12-16 NOTE — ED Triage Notes (Addendum)
Per EMS pt working at [place] turning a pt and experience central chest sharpness. Pt then verbalizes sat and witnessed by staff having syncopal event while sitting. Pt alert and oriented x4 with EMS arrival. Pt ambulatory with EMS; steady gait.

## 2016-12-16 NOTE — ED Provider Notes (Signed)
[facility] DEPT Provider Note   CSN: [id number] Arrival date & time: 12/16/16  [id number]     History   Chief Complaint Chief Complaint  Patient presents with  . Near Syncope    HPI [name] is a 36 y.o. female.  The history is provided by the patient. No language interpreter was used.  Near Syncope  This is a new problem. The current episode started 1 to 2 hours ago. The problem has been resolved. Associated symptoms include chest pain. Nothing aggravates the symptoms. Nothing relieves the symptoms. She has tried nothing for the symptoms.  Pt reports she had some chest tightness.  Pt reports she sat down and after sitting she passed out.  Pt did not fall.  No injuries. Pt reported to have remained responsive.   Past Medical History:  Diagnosis Date  . Anxiety   . Asthma   . Chronic back pain   . Depression     There are no active problems to display for this patient.   Past Surgical History:  Procedure Laterality Date  . ABDOMINAL HYSTERECTOMY    . APPENDECTOMY    . CESAREAN SECTION      OB History    No data available       Home Medications    Prior to Admission medications   Medication Sig Start Date End Date Taking? Authorizing Provider  albuterol (PROVENTIL HFA;VENTOLIN HFA) 108 (90 Base) MCG/ACT inhaler Inhale 2 puffs into the lungs every 4 (four) hours as needed for wheezing or shortness of breath.   Yes Historical Provider, MD  ALPRAZolam Duanne Moron) 0.5 MG tablet Take 0.5 mg by mouth at bedtime as needed for sleep.    Yes Historical Provider, MD  buPROPion (WELLBUTRIN XL) 300 MG 24 hr tablet Take 300 mg by mouth daily with breakfast.    Yes Historical Provider, MD  cyanocobalamin (,VITAMIN B-12,) 1000 MCG/ML injection Inject 1,000 mcg into the muscle every 30 (thirty) days.   Yes Historical Provider, MD  cyclobenzaprine (FLEXERIL) 5 MG tablet Take 5 mg by mouth 2 (two) times daily.   Yes Historical Provider, MD  diclofenac sodium (VOLTAREN) 1 %  GEL Apply 2 g topically 2 (two) times daily as needed (for pain).    Yes Historical Provider, MD  Fluticasone-Salmeterol (ADVAIR) 250-50 MCG/DOSE AEPB Inhale 1 puff into the lungs 2 (two) times daily.   Yes Historical Provider, MD  gabapentin (NEURONTIN) 300 MG capsule Take 300 mg by mouth at bedtime.   Yes Historical Provider, MD  HYDROcodone-acetaminophen (NORCO/VICODIN) 5-325 MG tablet Take 1 tablet by mouth every 8 (eight) hours as needed for moderate pain.   Yes Historical Provider, MD  lidocaine (XYLOCAINE) 2 % jelly Apply 1 application topically 2 (two) times daily.   Yes Historical Provider, MD  Multiple Vitamin (MULTIVITAMIN WITH MINERALS) TABS tablet Take 1 tablet by mouth 2 (two) times daily.   Yes Historical Provider, MD  nortriptyline (PAMELOR) 10 MG capsule Take 10 mg by mouth at bedtime.   Yes Historical Provider, MD  sertraline (ZOLOFT) 100 MG tablet Take 100 mg by mouth daily.    Yes Historical Provider, MD  traMADol (ULTRAM) 50 MG tablet Take 50 mg by mouth 3 (three) times daily.   Yes Historical Provider, MD  traZODone (DESYREL) 150 MG tablet Take 150 mg by mouth at bedtime.   Yes Historical Provider, MD  sulfamethoxazole-trimethoprim (BACTRIM DS,SEPTRA DS) 800-160 MG tablet Take 1 tablet by mouth 2 (two) times daily. Patient not taking: Reported  on 12/16/2016 10/21/16   Johnn Hai, PA-C    Family History Family History  Problem Relation Age of Onset  . Irritable bowel syndrome Mother   . Anxiety disorder Mother   . CAD Father   . Breast cancer Maternal Aunt 42    Social History Social History  Substance Use Topics  . Smoking status: Never Smoker  . Smokeless tobacco: Never Used  . Alcohol use Yes     Comment: rarely     Allergies   Bee venom; Fish allergy; Latex; and Penicillins   Review of Systems Review of Systems  Cardiovascular: Positive for chest pain and near-syncope.  All other systems reviewed and are negative.    Physical Exam Updated Vital  Signs BP 117/70 (BP Location: Left Arm)   Pulse 87   Temp 98.6 F (37 C)   Resp 18   SpO2 100%   Physical Exam  Constitutional: She is oriented to person, place, and time. She appears well-developed and well-nourished.  HENT:  Head: Normocephalic.  Right Ear: External ear normal.  Left Ear: External ear normal.  Eyes: Conjunctivae and EOM are normal.  Neck: Normal range of motion.  Pulmonary/Chest: Effort normal.  Abdominal: Soft. She exhibits no distension.  Musculoskeletal: Normal range of motion.  Neurological: She is alert and oriented to person, place, and time.  Skin: Skin is warm.  Psychiatric: She has a normal mood and affect.  Nursing note and vitals reviewed.    ED Treatments / Results  Labs (all labs ordered are listed, but only abnormal results are displayed) Labs Reviewed  BASIC METABOLIC PANEL - Abnormal; Notable for the following:       Result Value   Anion gap 4 (*)    All other components within normal limits  CBC - Abnormal; Notable for the following:    HCT 35.7 (*)    All other components within normal limits  URINALYSIS, ROUTINE W REFLEX MICROSCOPIC - Abnormal; Notable for the following:    APPearance HAZY (*)    All other components within normal limits  CBG MONITORING, ED  I-STAT TROPOININ, ED    EKG  EKG Interpretation  Date/Time:  Friday December 16 2016 10:33:49 EST Ventricular Rate:  101 PR Interval:    QRS Duration: 89 QT Interval:  343 QTC Calculation: 445 R Axis:   51 Text Interpretation:  Sinus tachycardia nonspecific st changes plus artifact Since previous tracing rate faster Confirmed by Canary Brim  MD, MARTHA [phone]) on 12/16/2016 12:18:45 PM       Radiology Dg Chest 2 View  Result Date: 12/16/2016 CLINICAL DATA:  Chest pain. EXAM: CHEST  2 VIEW COMPARISON:  02/06/2004 report. FINDINGS: Mediastinum and hilar structures are normal. Questionable tiny left apical nodule. Repeat PA lateral chest x-ray with apical lordotic view  suggested. No pleural effusion or pneumothorax. Degenerative changes thoracic spine. IMPRESSION: Questionable tiny pulmonary nodule left apex. Repeat PA lateral chest x-ray and apical lordotic chest x-ray suggested for further evaluation . Exam otherwise unremarkable. Electronically Signed   By: Marcello Moores  Register   On: 12/16/2016 10:19    Procedures Procedures (including critical care time)  Medications Ordered in ED Medications  0.9 %  sodium chloride infusion ( Intravenous Stopped 12/16/16 1357)     Initial Impression / Assessment and Plan / ED Course  I have reviewed the triage vital signs and the nursing notes.  Pertinent labs & imaging results that were available during my care of the patient were reviewed by me  and considered in my medical decision making (see chart for details).  Clinical Course as of Dec 17 1411  Fri Dec 16, 2016  1412 DG Chest 1 View [LS]    Clinical Course User Index [LS] [place], PA-C    EKG no acute abnormality,  Labs no acute.  Pt feels back to normal.  I doubt cardiac etiology.  Pt given note for work.  Pt advised to recheck with her primary on Monday  Final Clinical Impressions(s) / ED Diagnoses   Final diagnoses:  Near syncope  Weakness    New Prescriptions New Prescriptions   No medications on file  An After Visit Summary was printed and given to the patient.    Hollace Kinnier [place], PA-C 12/16/16 [place], MD 12/16/16 [phone]

## 2016-12-16 NOTE — ED Notes (Signed)
LAB DRAW X 2 UNSUCCESSFUL 

## 2016-12-19 ENCOUNTER — Ambulatory Visit
Admission: RE | Admit: 2016-12-19 | Discharge: 2016-12-19 | Disposition: A | Discharge status: Home / Self Care | Payer: Medicaid Other | Source: Ambulatory Visit | Attending: Physician Assistant | Admitting: Physician Assistant

## 2016-12-19 ENCOUNTER — Other Ambulatory Visit: Payer: Self-pay | Admitting: Physician Assistant

## 2016-12-19 DIAGNOSIS — R55 Syncope and collapse: Secondary | ICD-10-CM

## 2016-12-19 DIAGNOSIS — R51 Headache: Principal | ICD-10-CM

## 2016-12-19 DIAGNOSIS — R519 Headache, unspecified: Secondary | ICD-10-CM

## 2016-12-20 ENCOUNTER — Other Ambulatory Visit: Payer: Self-pay | Admitting: Physician Assistant

## 2016-12-20 DIAGNOSIS — R55 Syncope and collapse: Secondary | ICD-10-CM

## 2016-12-20 DIAGNOSIS — R51 Headache: Principal | ICD-10-CM

## 2016-12-20 DIAGNOSIS — R519 Headache, unspecified: Secondary | ICD-10-CM

## 2016-12-27 ENCOUNTER — Emergency Department
Admission: EM | Admit: 2016-12-27 | Discharge: 2016-12-27 | Disposition: A | Discharge status: Home / Self Care | Payer: Medicaid Other | Attending: Emergency Medicine | Admitting: Emergency Medicine

## 2016-12-27 ENCOUNTER — Encounter: Payer: Self-pay | Admitting: Emergency Medicine

## 2016-12-27 DIAGNOSIS — J069 Acute upper respiratory infection, unspecified: Secondary | ICD-10-CM | POA: Diagnosis not present

## 2016-12-27 DIAGNOSIS — J029 Acute pharyngitis, unspecified: Secondary | ICD-10-CM | POA: Diagnosis present

## 2016-12-27 DIAGNOSIS — Z79899 Other long term (current) drug therapy: Secondary | ICD-10-CM | POA: Diagnosis not present

## 2016-12-27 DIAGNOSIS — J45909 Unspecified asthma, uncomplicated: Secondary | ICD-10-CM | POA: Insufficient documentation

## 2016-12-27 MED ORDER — AZITHROMYCIN 250 MG PO TABS: ORAL_TABLET | ORAL | 0 refills | Status: AC

## 2016-12-27 MED ORDER — DEXAMETHASONE SODIUM PHOSPHATE 10 MG/ML IJ SOLN
10.0000 mg | Freq: Once | INTRAMUSCULAR | Status: AC
  Administered 2016-12-27: 10 mg via INTRAMUSCULAR
  Filled 2016-12-27: qty 1

## 2016-12-27 MED ORDER — AZITHROMYCIN 500 MG PO TABS
500.0000 mg | ORAL_TABLET | Freq: Once | ORAL | Status: AC
  Administered 2016-12-27: 500 mg via ORAL
  Filled 2016-12-27: qty 1

## 2016-12-27 NOTE — ED Triage Notes (Signed)
Pt to ed with c/o sore throat and fever since Sunday.

## 2016-12-27 NOTE — ED Provider Notes (Addendum)
[facility] Emergency Department Provider Note  ____________________________________________   I have reviewed the triage vital signs and the nursing notes.   HISTORY  Chief Complaint Fever and Sore Throat    HPI [name] is a 36 y.o. female who presents today complaining of cough and sore throat for 2-3 days. Also fever at home. Mild rhinorrhea. Able to swallow liquids and solids. Denies significant headache or stiff neck. States that the biggest problem is her sore throat. Able to open her mouth with no pain.States she has a baseline history of "enlarged tonsils".     Past Medical History:  Diagnosis Date  . Anxiety   . Asthma   . Chronic back pain   . Depression     There are no active problems to display for this patient.   Past Surgical History:  Procedure Laterality Date  . ABDOMINAL HYSTERECTOMY    . APPENDECTOMY    . CESAREAN SECTION      Prior to Admission medications   Medication Sig Start Date End Date Taking? Authorizing Provider  albuterol (PROVENTIL HFA;VENTOLIN HFA) 108 (90 Base) MCG/ACT inhaler Inhale 2 puffs into the lungs every 4 (four) hours as needed for wheezing or shortness of breath.    Historical Provider, MD  ALPRAZolam Duanne Moron) 0.5 MG tablet Take 0.5 mg by mouth at bedtime as needed for sleep.     Historical Provider, MD  buPROPion (WELLBUTRIN XL) 300 MG 24 hr tablet Take 300 mg by mouth daily with breakfast.     Historical Provider, MD  cyanocobalamin (,VITAMIN B-12,) 1000 MCG/ML injection Inject 1,000 mcg into the muscle every 30 (thirty) days.    Historical Provider, MD  cyclobenzaprine (FLEXERIL) 5 MG tablet Take 5 mg by mouth 2 (two) times daily.    Historical Provider, MD  diclofenac sodium (VOLTAREN) 1 % GEL Apply 2 g topically 2 (two) times daily as needed (for pain).     Historical Provider, MD  Fluticasone-Salmeterol (ADVAIR) 250-50 MCG/DOSE AEPB Inhale 1 puff into the lungs 2 (two) times daily.     Historical Provider, MD  gabapentin (NEURONTIN) 300 MG capsule Take 300 mg by mouth at bedtime.    Historical Provider, MD  HYDROcodone-acetaminophen (NORCO/VICODIN) 5-325 MG tablet Take 1 tablet by mouth every 8 (eight) hours as needed for moderate pain.    Historical Provider, MD  lidocaine (XYLOCAINE) 2 % jelly Apply 1 application topically 2 (two) times daily.    Historical Provider, MD  Multiple Vitamin (MULTIVITAMIN WITH MINERALS) TABS tablet Take 1 tablet by mouth 2 (two) times daily.    Historical Provider, MD  nortriptyline (PAMELOR) 10 MG capsule Take 10 mg by mouth at bedtime.    Historical Provider, MD  sertraline (ZOLOFT) 100 MG tablet Take 100 mg by mouth daily.     Historical Provider, MD  sulfamethoxazole-trimethoprim (BACTRIM DS,SEPTRA DS) 800-160 MG tablet Take 1 tablet by mouth 2 (two) times daily. Patient not taking: Reported on 12/16/2016 10/21/16   Johnn Hai, PA-C  traMADol (ULTRAM) 50 MG tablet Take 50 mg by mouth 3 (three) times daily.    Historical Provider, MD  traZODone (DESYREL) 150 MG tablet Take 150 mg by mouth at bedtime.    Historical Provider, MD    Allergies Bee venom; Fish allergy; Latex; and Penicillins  Family History  Problem Relation Age of Onset  . Irritable bowel syndrome Mother   . Anxiety disorder Mother   . CAD Father   . Breast cancer Maternal Aunt  46    Social History Social History  Substance Use Topics  . Smoking status: Never Smoker  . Smokeless tobacco: Never Used  . Alcohol use Yes     Comment: rarely    Review of Systems Constitutional: Fever no chills   Eyes: No visual changes. ENT: Positive sore throat. No stiff neck no neck pain Cardiovascular: Denies chest pain. Respiratory: Denies shortness of breath. Positive cough Gastrointestinal:   no vomiting.  No diarrhea.  No constipation. Genitourinary: Negative for dysuria. Musculoskeletal: Negative lower extremity swelling Skin: Negative for rash. Neurological: Negative  for severe headaches, focal weakness or numbness. 10-point ROS otherwise negative.  ____________________________________________   PHYSICAL EXAM:  VITAL SIGNS: ED Triage Vitals  Enc Vitals Group     BP 12/27/16 1237 111/78     Pulse Rate 12/27/16 1237 (!) 101     Resp 12/27/16 1237 20     Temp 12/27/16 1237 98.7 F (37.1 C)     Temp Source 12/27/16 1237 Oral     SpO2 12/27/16 1237 98 %     Weight 12/27/16 1238 192 lb (87.1 kg)     Height 12/27/16 1238 5\' 7"  (1.702 m)     Head Circumference --      Peak Flow --      Pain Score 12/27/16 1238 8     Pain Loc --      Pain Edu? --      Excl. in [place]? --     Constitutional: Alert and oriented. Well appearing and in no acute distress. Eyes: Conjunctivae are normal. PERRL. EOMI. Head: Atraumatic. Nose: No congestion/rhinnorhea. Mouth/Throat: Mucous membranes are moist.  Oropharynx Is lightly erythematous there is no exudate, there is right greater than left tonsillar hypertrophy but they're not kissing tonsils, at most she has grade 2 tonsillitis. There is no worse voiced there is no stridor there is no trismus there is no evidence of mass. There is no evidence of RPA or TPA Neck: No stridor.   Nontender with no meningismus, minimal anterior lymphadenopathy noted Cardiovascular: Normal rate, regular rhythm. Grossly normal heart sounds.  Good peripheral circulation. Respiratory: Normal respiratory effort.  No retractions. Lungs CTAB. Abdominal: Soft and nontender. No distention. No guarding no rebound Back:  There is no focal tenderness or step off.  there is no midline tenderness there are no lesions noted. there is no CVA tenderness Musculoskeletal: No lower extremity tenderness, no upper extremity tenderness. No joint effusions, no DVT signs strong distal pulses no edema Neurologic:  Normal speech and language. No gross focal neurologic deficits are appreciated.  Skin:  Skin is warm, dry and intact. No rash noted. Psychiatric: Mood  and affect are normal. Speech and behavior are normal.  ____________________________________________   LABS (all labs ordered are listed, but only abnormal results are displayed)  Labs Reviewed - No data to display ____________________________________________  EKG  I personally interpreted any EKGs ordered by me or triage  ____________________________________________  RADIOLOGY  I reviewed any imaging ordered by me or triage that were performed during my shift and, if possible, patient and/or family made aware of any abnormal findings. ____________________________________________   PROCEDURES  Procedure(s) performed: None  Procedures  Critical Care performed: None  ____________________________________________   INITIAL IMPRESSION / ASSESSMENT AND PLAN / ED COURSE  Pertinent labs & imaging results that were available during my care of the patient were reviewed by me and considered in my medical decision making (see chart for details).  Patient here with sore  throat and cough and rhinorrhea and fever most likely is viral however she does have enlarged tonsils. This could be her baseline as she states that they are known to be enlarged at baseline however having never seen them before I cannot say if there is tonsillitis involved. Patient certainly likely has viral tonsillitis but we will treat with steroids to decrease the discomfort in her throat which I think will help her great deal as that hurts her biggest complaint. No evidence of RPA or TPA at this time. No trismus. No swelling to the posterior aspect of the throat. I will start her with empiric coverage on azithromycin as she anaphylaxis to penicillin. This is because of tonsillitis which, although possibly viral, does present with an asymmetric tonsillar hypertrophy and pain. This is not known to be her baseline. Return precautions and follow-up given and understood. Patient very well hydrated.     ____________________________________________   FINAL CLINICAL IMPRESSION(S) / ED DIAGNOSES  Final diagnoses:  None      This chart was dictated using voice recognition software.  Despite best efforts to proofread,  errors can occur which can change meaning.      Schuyler Amor, MD 12/27/16 [place], MD 12/27/16 1336

## 2016-12-27 NOTE — ED Notes (Signed)
See triage note  States she developed sore throat and fever for the past couple days   Afebrile on arrival

## 2017-03-15 ENCOUNTER — Emergency Department (HOSPITAL_COMMUNITY)
Admission: EM | Admit: 2017-03-15 | Discharge: 2017-03-15 | Disposition: A | Discharge status: Home / Self Care | Payer: Medicaid Other | Attending: Dermatology | Admitting: Dermatology

## 2017-03-15 ENCOUNTER — Encounter (HOSPITAL_COMMUNITY): Payer: Self-pay | Admitting: Emergency Medicine

## 2017-03-15 DIAGNOSIS — R51 Headache: Secondary | ICD-10-CM | POA: Insufficient documentation

## 2017-03-15 DIAGNOSIS — Z5321 Procedure and treatment not carried out due to patient leaving prior to being seen by health care provider: Secondary | ICD-10-CM | POA: Insufficient documentation

## 2017-03-15 NOTE — ED Triage Notes (Signed)
Pt sts frontal HA x 2 days with some nausea

## 2017-03-22 DIAGNOSIS — J4520 Mild intermittent asthma, uncomplicated: Secondary | ICD-10-CM | POA: Insufficient documentation

## 2017-08-18 ENCOUNTER — Emergency Department
Admission: EM | Admit: 2017-08-18 | Discharge: 2017-08-18 | Disposition: A | Discharge status: Home / Self Care | Payer: Self-pay | Attending: Emergency Medicine | Admitting: Emergency Medicine

## 2017-08-18 ENCOUNTER — Encounter: Payer: Self-pay | Admitting: Emergency Medicine

## 2017-08-18 DIAGNOSIS — R51 Headache: Secondary | ICD-10-CM | POA: Insufficient documentation

## 2017-08-18 DIAGNOSIS — Z5321 Procedure and treatment not carried out due to patient leaving prior to being seen by health care provider: Secondary | ICD-10-CM | POA: Insufficient documentation

## 2017-08-18 LAB — CBC
HCT: 40.7 % (ref 35.0–47.0)
Hemoglobin: 13.7 g/dL (ref 12.0–16.0)
MCH: 28.7 pg (ref 26.0–34.0)
MCHC: 33.7 g/dL (ref 32.0–36.0)
MCV: 85.1 fL (ref 80.0–100.0)
PLATELETS: 350 10*3/uL (ref 150–440)
RBC: 4.78 MIL/uL (ref 3.80–5.20)
RDW: 13.4 % (ref 11.5–14.5)
WBC: 6.4 10*3/uL (ref 3.6–11.0)

## 2017-08-18 LAB — URINALYSIS, COMPLETE (UACMP) WITH MICROSCOPIC
Bacteria, UA: NONE SEEN
Bilirubin Urine: NEGATIVE
GLUCOSE, UA: NEGATIVE mg/dL
Hgb urine dipstick: NEGATIVE
Ketones, ur: NEGATIVE mg/dL
LEUKOCYTES UA: NEGATIVE
Nitrite: NEGATIVE
PH: 5 (ref 5.0–8.0)
Protein, ur: NEGATIVE mg/dL
SPECIFIC GRAVITY, URINE: 1.025 (ref 1.005–1.030)

## 2017-08-18 LAB — COMPREHENSIVE METABOLIC PANEL
ALK PHOS: 85 U/L (ref 38–126)
ALT: 20 U/L (ref 14–54)
AST: 31 U/L (ref 15–41)
Albumin: 4.2 g/dL (ref 3.5–5.0)
Anion gap: 8 (ref 5–15)
BUN: 13 mg/dL (ref 6–20)
CALCIUM: 9.5 mg/dL (ref 8.9–10.3)
CHLORIDE: 106 mmol/L (ref 101–111)
CO2: 24 mmol/L (ref 22–32)
CREATININE: 0.8 mg/dL (ref 0.44–1.00)
Glucose, Bld: 115 mg/dL — ABNORMAL HIGH (ref 65–99)
Potassium: 3.8 mmol/L (ref 3.5–5.1)
Sodium: 138 mmol/L (ref 135–145)
Total Bilirubin: 0.7 mg/dL (ref 0.3–1.2)
Total Protein: 7.6 g/dL (ref 6.5–8.1)

## 2017-08-18 LAB — LIPASE, BLOOD: LIPASE: 19 U/L (ref 11–51)

## 2017-08-18 NOTE — ED Triage Notes (Signed)
Says really bad headache causing vomiting x 2days.  Says she has pain in upper abd as well, and has had fever.  Says the headache is frontal and feels like previous headaches she has had.

## 2017-08-18 NOTE — ED Notes (Signed)
Called X 3 5 min apart for room. No answer.

## 2017-08-18 NOTE — ED Triage Notes (Signed)
FIRST NURSE NOTE-here for headache X 2 days. Ambulatory without distress. NAD.

## 2017-08-21 ENCOUNTER — Telehealth (HOSPITAL_COMMUNITY): Payer: Self-pay | Admitting: Emergency Medicine

## 2017-08-21 NOTE — Telephone Encounter (Signed)
Called patient due to lwot to inquire about condition and follow up plans. Left message.   

## 2017-10-11 ENCOUNTER — Telehealth: Payer: Self-pay | Admitting: Internal Medicine

## 2017-10-11 NOTE — Telephone Encounter (Signed)
This is an old patient that wants to know if we have any of her immunization records.  Her call back is [phone]  Thanks Con Memos

## 2017-10-11 NOTE — Telephone Encounter (Signed)
Immunization record printed and left up front. Only documentation was a Tdap from 2012.

## 2017-12-14 DIAGNOSIS — M7542 Impingement syndrome of left shoulder: Secondary | ICD-10-CM | POA: Insufficient documentation

## 2017-12-15 ENCOUNTER — Ambulatory Visit
Admission: EM | Admit: 2017-12-15 | Discharge: 2017-12-15 | Disposition: A | Discharge status: Home / Self Care | Payer: Self-pay | Attending: Family Medicine | Admitting: Family Medicine

## 2017-12-15 ENCOUNTER — Encounter: Payer: Self-pay | Admitting: *Deleted

## 2017-12-15 DIAGNOSIS — J0100 Acute maxillary sinusitis, unspecified: Secondary | ICD-10-CM

## 2017-12-15 DIAGNOSIS — R059 Cough, unspecified: Secondary | ICD-10-CM

## 2017-12-15 DIAGNOSIS — R05 Cough: Secondary | ICD-10-CM

## 2017-12-15 MED ORDER — DOXYCYCLINE HYCLATE 100 MG PO TABS: 100.0000 mg | ORAL_TABLET | Freq: Two times a day (BID) | ORAL | 0 refills | Status: DC

## 2017-12-15 MED ORDER — BENZONATATE 200 MG PO CAPS: 200.0000 mg | ORAL_CAPSULE | Freq: Three times a day (TID) | ORAL | 0 refills | Status: DC | PRN

## 2017-12-15 MED ORDER — GUAIFENESIN-CODEINE 100-10 MG/5ML PO SOLN: ORAL | 0 refills | Status: DC

## 2017-12-15 MED ORDER — ONDANSETRON 8 MG PO TBDP: 8.0000 mg | ORAL_TABLET | Freq: Three times a day (TID) | ORAL | 0 refills | Status: DC | PRN

## 2017-12-15 NOTE — ED Provider Notes (Signed)
[facility]    CSN: [id number] Arrival date & time: 12/15/17  3382     History   Chief Complaint Chief Complaint  Patient presents with  . Influenza    APPT    HPI [name] is a 37 y.o. female.   The history is provided by the patient.  Influenza  Presenting symptoms: cough, fever and headache   Presenting symptoms: no sore throat   Associated symptoms: nasal congestion   URI  Presenting symptoms: congestion, cough, facial pain and fever   Presenting symptoms: no sore throat   Severity:  Moderate Onset quality:  Sudden Duration:  4 weeks Timing:  Constant Progression:  Worsening Chronicity:  New Relieved by:  Nothing Ineffective treatments:  OTC medications Associated symptoms: headaches and sinus pain   Associated symptoms: no wheezing   Risk factors: chronic respiratory disease and sick contacts   Risk factors: not elderly, no chronic cardiac disease, no chronic kidney disease, no diabetes mellitus, no immunosuppression, no recent illness and no recent travel     Past Medical History:  Diagnosis Date  . Anxiety   . Asthma   . Chronic back pain   . Depression     There are no active problems to display for this patient.   Past Surgical History:  Procedure Laterality Date  . ABDOMINAL HYSTERECTOMY    . APPENDECTOMY    . CESAREAN SECTION      OB History    No data available       Home Medications    Prior to Admission medications   Medication Sig Start Date End Date Taking? Authorizing Provider  albuterol (PROVENTIL HFA;VENTOLIN HFA) 108 (90 Base) MCG/ACT inhaler Inhale 2 puffs into the lungs every 4 (four) hours as needed for wheezing or shortness of breath.    [provider]  ALPRAZolam Duanne Moron) 0.5 MG tablet Take 0.5 mg by mouth at bedtime as needed for sleep.     [provider]  benzonatate (TESSALON) 200 MG capsule Take 1 capsule (200 mg total) by mouth 3 (three) times daily as needed. 12/15/17    Norval Gable, MD  buPROPion (WELLBUTRIN XL) 300 MG 24 hr tablet Take 300 mg by mouth daily with breakfast.     [provider]  cyanocobalamin (,VITAMIN B-12,) 1000 MCG/ML injection Inject 1,000 mcg into the muscle every 30 (thirty) days.    [provider]  cyclobenzaprine (FLEXERIL) 5 MG tablet Take 5 mg by mouth 2 (two) times daily.    [provider]  diclofenac sodium (VOLTAREN) 1 % GEL Apply 2 g topically 2 (two) times daily as needed (for pain).     [provider]  doxycycline (VIBRA-TABS) 100 MG tablet Take 1 tablet (100 mg total) by mouth 2 (two) times daily. 12/15/17   Norval Gable, MD  Fluticasone-Salmeterol (ADVAIR) 250-50 MCG/DOSE AEPB Inhale 1 puff into the lungs 2 (two) times daily.    [provider]  gabapentin (NEURONTIN) 300 MG capsule Take 300 mg by mouth at bedtime.    [provider]  guaiFENesin-codeine 100-10 MG/5ML syrup 5-10 ml po qhs 12/15/17   Norval Gable, MD  HYDROcodone-acetaminophen (NORCO/VICODIN) 5-325 MG tablet Take 1 tablet by mouth every 8 (eight) hours as needed for moderate pain.    [provider]  lidocaine (XYLOCAINE) 2 % jelly Apply 1 application topically 2 (two) times daily.    [provider]  Multiple Vitamin (MULTIVITAMIN WITH MINERALS) TABS tablet Take 1 tablet by mouth  2 (two) times daily.    [provider]  nortriptyline (PAMELOR) 10 MG capsule Take 10 mg by mouth at bedtime.    [provider]  ondansetron (ZOFRAN ODT) 8 MG disintegrating tablet Take 1 tablet (8 mg total) by mouth every 8 (eight) hours as needed. 12/15/17   Norval Gable, MD  sertraline (ZOLOFT) 100 MG tablet Take 100 mg by mouth daily.     [provider]  sulfamethoxazole-trimethoprim (BACTRIM DS,SEPTRA DS) 800-160 MG tablet Take 1 tablet by mouth 2 (two) times daily. Patient not taking: Reported on 12/16/2016 10/21/16   Johnn Hai, PA-C  traMADol (ULTRAM) 50 MG tablet Take 50  mg by mouth 3 (three) times daily.    [provider]  traZODone (DESYREL) 150 MG tablet Take 150 mg by mouth at bedtime.    [provider]    Family History Family History  Problem Relation Age of Onset  . Irritable bowel syndrome Mother   . Anxiety disorder Mother   . CAD Father   . Breast cancer Maternal Aunt 42    Social History Social History   Tobacco Use  . Smoking status: Never Smoker  . Smokeless tobacco: Never Used  Substance Use Topics  . Alcohol use: Yes    Comment: rarely  . Drug use: No     Allergies   Bee venom; Fish allergy; Latex; and Penicillins   Review of Systems Review of Systems  Constitutional: Positive for fever.  HENT: Positive for congestion and sinus pain. Negative for sore throat.   Respiratory: Positive for cough. Negative for wheezing.   Neurological: Positive for headaches.     Physical Exam Triage Vital Signs ED Triage Vitals  Enc Vitals Group     BP 12/15/17 1020 140/90     Pulse Rate 12/15/17 1020 80     Resp 12/15/17 1020 16     Temp 12/15/17 1020 98.4 F (36.9 C)     Temp Source 12/15/17 1020 Oral     SpO2 12/15/17 1020 100 %     Weight 12/15/17 1019 210 lb (95.3 kg)     Height 12/15/17 1019 5\' 7"  (1.702 m)     Head Circumference --      Peak Flow --      Pain Score 12/15/17 1018 8     Pain Loc --      Pain Edu? --      Excl. in [place]? --    No data found.  Updated Vital Signs BP 140/90 (BP Location: Left Arm)   Pulse 80   Temp 98.4 F (36.9 C) (Oral)   Resp 16   Ht 5\' 7"  (1.702 m)   Wt 210 lb (95.3 kg)   SpO2 100%   BMI 32.89 kg/m   Visual Acuity Right Eye Distance:   Left Eye Distance:   Bilateral Distance:    Right Eye Near:   Left Eye Near:    Bilateral Near:     Physical Exam  Constitutional: She appears well-developed and well-nourished. No distress.  HENT:  Head: Normocephalic and atraumatic.  Right Ear: Tympanic membrane, external ear and ear canal normal.  Left Ear:  Tympanic membrane, external ear and ear canal normal.  Nose: Mucosal edema and rhinorrhea present. No nose lacerations, sinus tenderness, nasal deformity, septal deviation or nasal septal hematoma. No epistaxis.  No foreign bodies. Right sinus exhibits maxillary sinus tenderness and frontal sinus tenderness. Left sinus exhibits maxillary sinus tenderness and frontal sinus tenderness.  Mouth/Throat: Uvula is midline, oropharynx is clear and moist and mucous membranes are normal. No oropharyngeal exudate.  Eyes: Conjunctivae and EOM are normal. Pupils are equal, round, and reactive to light. Right eye exhibits no discharge. Left eye exhibits no discharge. No scleral icterus.  Neck: Normal range of motion. Neck supple. No thyromegaly present.  Cardiovascular: Normal rate, regular rhythm and normal heart sounds.  Pulmonary/Chest: Effort normal and breath sounds normal. No respiratory distress. She has no wheezes. She has no rales.  Lymphadenopathy:    She has no cervical adenopathy.  Skin: She is not diaphoretic.  Nursing note and vitals reviewed.    UC Treatments / Results  Labs (all labs ordered are listed, but only abnormal results are displayed) Labs Reviewed - No data to display  EKG  EKG Interpretation None       Radiology No results found.  Procedures Procedures (including critical care time)  Medications Ordered in UC Medications - No data to display   Initial Impression / Assessment and Plan / UC Course  I have reviewed the triage vital signs and the nursing notes.  Pertinent labs & imaging results that were available during my care of the patient were reviewed by me and considered in my medical decision making (see chart for details).      Final Clinical Impressions(s) / UC Diagnoses   Final diagnoses:  Acute maxillary sinusitis, recurrence not specified  Cough    ED Discharge Orders        Ordered    doxycycline (VIBRA-TABS) 100 MG tablet  2 times daily       12/15/17 1051    benzonatate (TESSALON) 200 MG capsule  3 times daily PRN     12/15/17 1052    guaiFENesin-codeine 100-10 MG/5ML syrup     12/15/17 1052    ondansetron (ZOFRAN ODT) 8 MG disintegrating tablet  Every 8 hours PRN     12/15/17 1053     1. diagnosis reviewed with patient 2. rx as per orders above; reviewed possible side effects, interactions, risks and benefits  3. Recommend supportive treatment with rest, fluids, otc flonase 4. Follow-up prn if symptoms worsen or don't improve  Controlled Substance Prescriptions [place] Controlled Substance Registry consulted? Not Applicable   Norval Gable, MD 12/15/17 1055

## 2017-12-15 NOTE — ED Triage Notes (Signed)
C/o cough x 4 weeks, nose congestion, stomach pain, fever 102 last night. Pt has flu shot this year

## 2018-05-30 ENCOUNTER — Emergency Department
Admission: EM | Admit: 2018-05-30 | Discharge: 2018-05-30 | Disposition: A | Discharge status: Home / Self Care | Payer: Self-pay | Attending: Emergency Medicine | Admitting: Emergency Medicine

## 2018-05-30 ENCOUNTER — Encounter: Payer: Self-pay | Admitting: Emergency Medicine

## 2018-05-30 ENCOUNTER — Other Ambulatory Visit: Payer: Self-pay

## 2018-05-30 DIAGNOSIS — Z91040 Latex allergy status: Secondary | ICD-10-CM | POA: Insufficient documentation

## 2018-05-30 DIAGNOSIS — Z79899 Other long term (current) drug therapy: Secondary | ICD-10-CM | POA: Insufficient documentation

## 2018-05-30 DIAGNOSIS — J020 Streptococcal pharyngitis: Secondary | ICD-10-CM

## 2018-05-30 DIAGNOSIS — R509 Fever, unspecified: Secondary | ICD-10-CM

## 2018-05-30 DIAGNOSIS — J45909 Unspecified asthma, uncomplicated: Secondary | ICD-10-CM | POA: Insufficient documentation

## 2018-05-30 LAB — GROUP A STREP BY PCR: GROUP A STREP BY PCR: DETECTED — AB

## 2018-05-30 MED ORDER — ACETAMINOPHEN 500 MG PO TABS
1000.0000 mg | ORAL_TABLET | Freq: Once | ORAL | Status: AC
  Administered 2018-05-30: 1000 mg via ORAL
  Filled 2018-05-30: qty 2

## 2018-05-30 MED ORDER — CLINDAMYCIN HCL 150 MG PO CAPS
300.0000 mg | ORAL_CAPSULE | Freq: Once | ORAL | Status: AC
  Administered 2018-05-30: 300 mg via ORAL
  Filled 2018-05-30: qty 2

## 2018-05-30 MED ORDER — CLINDAMYCIN HCL 300 MG PO CAPS: 300.0000 mg | ORAL_CAPSULE | Freq: Four times a day (QID) | ORAL | 0 refills | Status: AC

## 2018-05-30 MED ORDER — IBUPROFEN 800 MG PO TABS
800.0000 mg | ORAL_TABLET | Freq: Once | ORAL | Status: AC
  Administered 2018-05-30: 800 mg via ORAL

## 2018-05-30 MED ORDER — IBUPROFEN 800 MG PO TABS
ORAL_TABLET | ORAL | Status: AC
  Filled 2018-05-30: qty 1

## 2018-05-30 NOTE — Discharge Instructions (Addendum)
Is alternate Tylenol and ibuprofen as needed for fevers, body aches and sore throat.  Make sure you are drinking lots of fluids.  If any difficulty swallowing, fevers above 102 that are not going down with Tylenol and ibuprofen, worsening symptoms or urgent changes in health please return to the emergency department.

## 2018-05-30 NOTE — ED Provider Notes (Signed)
[facility] Provider Note   CSN: [id number] Arrival date & time: 05/30/18  2021     History   Chief Complaint Chief Complaint  Patient presents with  . Fever  . Sore Throat    HPI [name] is a 37 y.o. female presents to the emergency department for evaluation of fever, sore throat.  Symptoms began last night.  She said fevers up to 101.3.  She has not had a Tylenol or ibuprofen.  She also describes body aches and chills.  She denies any cough congestion runny nose.  No difficulty swallowing.  She is tolerating p.o. well.  HPI  Past Medical History:  Diagnosis Date  . Anxiety   . Asthma   . Chronic back pain   . Depression     There are no active problems to display for this patient.   Past Surgical History:  Procedure Laterality Date  . ABDOMINAL HYSTERECTOMY    . APPENDECTOMY    . CESAREAN SECTION    . DILATION AND CURETTAGE OF UTERUS       OB History   None      Home Medications    Prior to Admission medications   Medication Sig Start Date End Date Taking? Authorizing Provider  albuterol (PROVENTIL HFA;VENTOLIN HFA) 108 (90 Base) MCG/ACT inhaler Inhale 2 puffs into the lungs every 4 (four) hours as needed for wheezing or shortness of breath.    [provider]  ALPRAZolam Duanne Moron) 0.5 MG tablet Take 0.5 mg by mouth at bedtime as needed for sleep.     [provider]  benzonatate (TESSALON) 200 MG capsule Take 1 capsule (200 mg total) by mouth 3 (three) times daily as needed. 12/15/17   Norval Gable, MD  buPROPion (WELLBUTRIN XL) 300 MG 24 hr tablet Take 300 mg by mouth daily with breakfast.     [provider]  clindamycin (CLEOCIN) 300 MG capsule Take 1 capsule (300 mg total) by mouth 4 (four) times daily for 10 days. 05/30/18 06/09/18  Duanne Guess, PA-C  cyanocobalamin (,VITAMIN B-12,) 1000 MCG/ML injection Inject 1,000 mcg into the muscle every 30 (thirty) days.     [provider]  cyclobenzaprine (FLEXERIL) 5 MG tablet Take 5 mg by mouth 2 (two) times daily.    [provider]  diclofenac sodium (VOLTAREN) 1 % GEL Apply 2 g topically 2 (two) times daily as needed (for pain).     [provider]  doxycycline (VIBRA-TABS) 100 MG tablet Take 1 tablet (100 mg total) by mouth 2 (two) times daily. 12/15/17   Norval Gable, MD  Fluticasone-Salmeterol (ADVAIR) 250-50 MCG/DOSE AEPB Inhale 1 puff into the lungs 2 (two) times daily.    [provider]  gabapentin (NEURONTIN) 300 MG capsule Take 300 mg by mouth at bedtime.    [provider]  guaiFENesin-codeine 100-10 MG/5ML syrup 5-10 ml po qhs 12/15/17   Norval Gable, MD  HYDROcodone-acetaminophen (NORCO/VICODIN) 5-325 MG tablet Take 1 tablet by mouth every 8 (eight) hours as needed for moderate pain.    [provider]  lidocaine (XYLOCAINE) 2 % jelly Apply 1 application topically 2 (two) times daily.    [provider]  Multiple Vitamin (MULTIVITAMIN WITH MINERALS) TABS tablet Take 1 tablet by mouth 2 (two) times daily.    [provider]  nortriptyline (PAMELOR) 10 MG capsule Take 10 mg by mouth at bedtime.    [provider]  ondansetron (ZOFRAN ODT) 8  MG disintegrating tablet Take 1 tablet (8 mg total) by mouth every 8 (eight) hours as needed. 12/15/17   Norval Gable, MD  sertraline (ZOLOFT) 100 MG tablet Take 100 mg by mouth daily.     [provider]  sulfamethoxazole-trimethoprim (BACTRIM DS,SEPTRA DS) 800-160 MG tablet Take 1 tablet by mouth 2 (two) times daily. Patient not taking: Reported on 12/16/2016 10/21/16   Johnn Hai, PA-C  traMADol (ULTRAM) 50 MG tablet Take 50 mg by mouth 3 (three) times daily.    [provider]  traZODone (DESYREL) 150 MG tablet Take 150 mg by mouth at bedtime.    [provider]    Family History Family History  Problem Relation Age of Onset  . Irritable bowel  syndrome Mother   . Anxiety disorder Mother   . CAD Father   . Breast cancer Maternal Aunt 42    Social History Social History   Tobacco Use  . Smoking status: Never Smoker  . Smokeless tobacco: Never Used  Substance Use Topics  . Alcohol use: Yes    Comment: rarely  . Drug use: No     Allergies   Bee venom; Fish allergy; Latex; and Penicillins   Review of Systems Review of Systems  Constitutional: Positive for fever.  HENT: Positive for sore throat. Negative for congestion, trouble swallowing and voice change.   Respiratory: Negative for cough and shortness of breath.   Cardiovascular: Negative for chest pain.  Gastrointestinal: Negative for abdominal pain.  Genitourinary: Negative for difficulty urinating, dysuria and urgency.  Musculoskeletal: Positive for myalgias. Negative for back pain.  Skin: Negative for rash.  Neurological: Negative for dizziness and headaches.     Physical Exam Updated Vital Signs BP (!) 139/94 (BP Location: Left Arm)   Pulse (!) 116   Temp (!) 101.3 F (38.5 C) (Oral)   Resp 20   Ht 5\' 7"  (1.702 m)   Wt 95.3 kg   SpO2 100%   BMI 32.89 kg/m   Physical Exam  Constitutional: She is oriented to person, place, and time. She appears well-developed and well-nourished. No distress.  HENT:  Head: Normocephalic and atraumatic.  Right Ear: Hearing, tympanic membrane, external ear and ear canal normal.  Left Ear: Hearing, tympanic membrane, external ear and ear canal normal.  Nose: Rhinorrhea present.  Mouth/Throat: Uvula is midline and mucous membranes are normal. No trismus in the jaw. No uvula swelling. Oropharyngeal exudate present. No posterior oropharyngeal edema, posterior oropharyngeal erythema or tonsillar abscesses. No tonsillar exudate.  Eyes: Conjunctivae are normal. Right eye exhibits no discharge. Left eye exhibits no discharge.  Neck: Normal range of motion.  Cardiovascular: Normal rate and regular rhythm.  Pulmonary/Chest:  Effort normal and breath sounds normal. No stridor. No respiratory distress. She has no wheezes. She has no rales.  Abdominal: Soft. She exhibits no distension. There is no tenderness.  Musculoskeletal: Normal range of motion. She exhibits no deformity.  Lymphadenopathy:    She has cervical adenopathy.  Neurological: She is alert and oriented to person, place, and time. She has normal reflexes.  Skin: Skin is warm and dry.  Psychiatric: She has a normal mood and affect. Her behavior is normal. Thought content normal.     ED Treatments / Results  Labs (all labs ordered are listed, but only abnormal results are displayed) Labs Reviewed  GROUP A STREP BY PCR - Abnormal; Notable for the following components:      Result Value   Group A Strep by  PCR DETECTED (*)    All other components within normal limits    EKG None  Radiology No results found.  Procedures Procedures (including critical care time)  Medications Ordered in ED Medications  acetaminophen (TYLENOL) tablet 1,000 mg (has no administration in time range)  clindamycin (CLEOCIN) capsule 300 mg (has no administration in time range)  ibuprofen (ADVIL,MOTRIN) tablet 800 mg (800 mg Oral Given 05/30/18 2035)     Initial Impression / Assessment and Plan / ED Course  I have reviewed the triage vital signs and the nursing notes.  Pertinent labs & imaging results that were available during my care of the patient were reviewed by me and considered in my medical decision making (see chart for details).     37 year old female positive for strep pharyngitis via PCR.  She is allergic to penicillins and started on clindamycin.  She is tolerating p.o. well.  She is given Tylenol and ibuprofen for fevers.  She understands signs symptoms return to ED for.  Final Clinical Impressions(s) / ED Diagnoses   Final diagnoses:  Strep pharyngitis  Fever, unspecified fever cause    ED Discharge Orders         Ordered    clindamycin  (CLEOCIN) 300 MG capsule  4 times daily     05/30/18 2225           Renata Caprice 05/30/18 2229    Delman Kitten, MD 05/31/18 0003

## 2018-05-30 NOTE — ED Triage Notes (Signed)
Pt presents to ED with fever and sore throat since yesterday. 101+ at home. Tylenol taken around 1630.

## 2018-07-27 ENCOUNTER — Other Ambulatory Visit: Payer: Self-pay

## 2018-07-27 ENCOUNTER — Emergency Department
Admission: EM | Admit: 2018-07-27 | Discharge: 2018-07-27 | Disposition: A | Discharge status: Home / Self Care | Payer: Medicaid Other | Attending: Emergency Medicine | Admitting: Emergency Medicine

## 2018-07-27 DIAGNOSIS — Z79899 Other long term (current) drug therapy: Secondary | ICD-10-CM | POA: Insufficient documentation

## 2018-07-27 DIAGNOSIS — Z91040 Latex allergy status: Secondary | ICD-10-CM | POA: Insufficient documentation

## 2018-07-27 DIAGNOSIS — N3090 Cystitis, unspecified without hematuria: Secondary | ICD-10-CM

## 2018-07-27 DIAGNOSIS — J45909 Unspecified asthma, uncomplicated: Secondary | ICD-10-CM | POA: Insufficient documentation

## 2018-07-27 LAB — URINALYSIS, COMPLETE (UACMP) WITH MICROSCOPIC
Bilirubin Urine: NEGATIVE
Glucose, UA: NEGATIVE mg/dL
KETONES UR: NEGATIVE mg/dL
Leukocytes, UA: NEGATIVE
Nitrite: NEGATIVE
PH: 5 (ref 5.0–8.0)
Protein, ur: NEGATIVE mg/dL
SPECIFIC GRAVITY, URINE: 1.023 (ref 1.005–1.030)

## 2018-07-27 LAB — POCT PREGNANCY, URINE: PREG TEST UR: NEGATIVE

## 2018-07-27 MED ORDER — OXYCODONE-ACETAMINOPHEN 5-325 MG PO TABS
1.0000 | ORAL_TABLET | Freq: Once | ORAL | Status: AC
  Administered 2018-07-27: 1 via ORAL
  Filled 2018-07-27: qty 1

## 2018-07-27 MED ORDER — OXYCODONE-ACETAMINOPHEN 7.5-325 MG PO TABS: 1.0000 | ORAL_TABLET | Freq: Four times a day (QID) | ORAL | 0 refills | Status: DC | PRN

## 2018-07-27 MED ORDER — CYCLOBENZAPRINE HCL 10 MG PO TABS
10.0000 mg | ORAL_TABLET | Freq: Once | ORAL | Status: AC
  Administered 2018-07-27: 10 mg via ORAL
  Filled 2018-07-27: qty 1

## 2018-07-27 MED ORDER — SULFAMETHOXAZOLE-TRIMETHOPRIM 800-160 MG PO TABS: 1.0000 | ORAL_TABLET | Freq: Two times a day (BID) | ORAL | 0 refills | Status: DC

## 2018-07-27 MED ORDER — LIDOCAINE 5 % EX PTCH
1.0000 | MEDICATED_PATCH | CUTANEOUS | Status: DC
  Administered 2018-07-27: 1 via TRANSDERMAL
  Filled 2018-07-27: qty 1

## 2018-07-27 NOTE — ED Provider Notes (Signed)
[facility] Emergency Department Provider Note   ____________________________________________   First MD Initiated Contact with Patient 07/27/18 1457     (approximate)  I have reviewed the triage vital signs and the nursing notes.   HISTORY  Chief Complaint Back Pain    HPI [name] is a 37 y.o. female patient presents with nontraumatic left flank pain for 2 days.  Patient denies urinary complaint.  Patient denies history of kidney stones.  Patient describes her pain is "achy".  No relief with over-the-counter anti-inflammatory medications.  Past Medical History:  Diagnosis Date  . Anxiety   . Asthma   . Chronic back pain   . Depression     There are no active problems to display for this patient.   Past Surgical History:  Procedure Laterality Date  . ABDOMINAL HYSTERECTOMY    . APPENDECTOMY    . CESAREAN SECTION    . DILATION AND CURETTAGE OF UTERUS      Prior to Admission medications   Medication Sig Start Date End Date Taking? Authorizing Provider  albuterol (PROVENTIL HFA;VENTOLIN HFA) 108 (90 Base) MCG/ACT inhaler Inhale 2 puffs into the lungs every 4 (four) hours as needed for wheezing or shortness of breath.    [provider]  ALPRAZolam Duanne Moron) 0.5 MG tablet Take 0.5 mg by mouth at bedtime as needed for sleep.     [provider]  benzonatate (TESSALON) 200 MG capsule Take 1 capsule (200 mg total) by mouth 3 (three) times daily as needed. 12/15/17   Norval Gable, MD  buPROPion (WELLBUTRIN XL) 300 MG 24 hr tablet Take 300 mg by mouth daily with breakfast.     [provider]  cyanocobalamin (,VITAMIN B-12,) 1000 MCG/ML injection Inject 1,000 mcg into the muscle every 30 (thirty) days.    [provider]  cyclobenzaprine (FLEXERIL) 5 MG tablet Take 5 mg by mouth 2 (two) times daily.    [provider]  diclofenac sodium (VOLTAREN) 1 % GEL Apply 2 g topically 2 (two) times daily as  needed (for pain).     [provider]  doxycycline (VIBRA-TABS) 100 MG tablet Take 1 tablet (100 mg total) by mouth 2 (two) times daily. 12/15/17   Norval Gable, MD  Fluticasone-Salmeterol (ADVAIR) 250-50 MCG/DOSE AEPB Inhale 1 puff into the lungs 2 (two) times daily.    [provider]  gabapentin (NEURONTIN) 300 MG capsule Take 300 mg by mouth at bedtime.    [provider]  guaiFENesin-codeine 100-10 MG/5ML syrup 5-10 ml po qhs 12/15/17   Norval Gable, MD  HYDROcodone-acetaminophen (NORCO/VICODIN) 5-325 MG tablet Take 1 tablet by mouth every 8 (eight) hours as needed for moderate pain.    [provider]  lidocaine (XYLOCAINE) 2 % jelly Apply 1 application topically 2 (two) times daily.    [provider]  Multiple Vitamin (MULTIVITAMIN WITH MINERALS) TABS tablet Take 1 tablet by mouth 2 (two) times daily.    [provider]  nortriptyline (PAMELOR) 10 MG capsule Take 10 mg by mouth at bedtime.    [provider]  ondansetron (ZOFRAN ODT) 8 MG disintegrating tablet Take 1 tablet (8 mg total) by mouth every 8 (eight) hours as needed. 12/15/17   Norval Gable, MD  oxyCODONE-acetaminophen (PERCOCET) 7.5-325 MG tablet Take 1 tablet by mouth every 6 (six) hours as needed for severe pain. 07/27/18   Sable Feil, PA-C  sertraline (ZOLOFT) 100 MG tablet Take 100 mg by mouth daily.  [provider]  sulfamethoxazole-trimethoprim (BACTRIM DS,SEPTRA DS) 800-160 MG tablet Take 1 tablet by mouth 2 (two) times daily. Patient not taking: Reported on 12/16/2016 10/21/16   Johnn Hai, PA-C  sulfamethoxazole-trimethoprim (BACTRIM DS,SEPTRA DS) 800-160 MG tablet Take 1 tablet by mouth 2 (two) times daily. 07/27/18   Sable Feil, PA-C  traMADol (ULTRAM) 50 MG tablet Take 50 mg by mouth 3 (three) times daily.    [provider]  traZODone (DESYREL) 150 MG tablet Take 150 mg by mouth at bedtime.    [provider]     Allergies Bee venom; Fish allergy; Latex; and Penicillins  Family History  Problem Relation Age of Onset  . Irritable bowel syndrome Mother   . Anxiety disorder Mother   . CAD Father   . Breast cancer Maternal Aunt 42    Social History Social History   Tobacco Use  . Smoking status: Never Smoker  . Smokeless tobacco: Never Used  Substance Use Topics  . Alcohol use: Yes    Comment: rarely  . Drug use: No    Review of Systems Constitutional: No fever/chills Eyes: No visual changes. ENT: No sore throat. Cardiovascular: Denies chest pain. Respiratory: Denies shortness of breath. Gastrointestinal: No abdominal pain.  No nausea, no vomiting.  No diarrhea.  No constipation. Genitourinary: Negative for dysuria. Musculoskeletal: Positive chronic back pain. Skin: Negative for rash. Neurological: Negative for headaches, focal weakness or numbness. Psychiatric:Anxiety depression Endocrine: Hematological/Lymphatic: Allergic/Immunilogical: See allergy list. ____________________________________________   PHYSICAL EXAM:  VITAL SIGNS: ED Triage Vitals [07/27/18 1450]  Enc Vitals Group     BP (!) 147/92     Pulse Rate 98     Resp 18     Temp 98.8 F (37.1 C)     Temp Source Oral     SpO2 100 %     Weight 200 lb (90.7 kg)     Height 5\' 7"  (1.702 m)     Head Circumference      Peak Flow      Pain Score 8     Pain Loc      Pain Edu?      Excl. in [place]?    Constitutional: Alert and oriented. Well appearing and in no acute distress. Neck: No cervical spine tenderness to palpation. Cardiovascular: Normal rate, regular rhythm. Grossly normal heart sounds.  Good peripheral circulation. Respiratory: Normal respiratory effort.  No retractions. Lungs CTAB. Gastrointestinal: Soft and nontender. No distention. No abdominal bruits.  Left CVA tenderness. Genitourinary: Deferred Musculoskeletal: No lower extremity tenderness nor edema.  No joint effusions. Neurologic:   Normal speech and language. No gross focal neurologic deficits are appreciated. No gait instability. Skin:  Skin is warm, dry and intact. No rash noted. Psychiatric: Mood and affect are normal. Speech and behavior are normal.  ____________________________________________   LABS (all labs ordered are listed, but only abnormal results are displayed)  Labs Reviewed  URINALYSIS, COMPLETE (UACMP) WITH MICROSCOPIC - Abnormal; Notable for the following components:      Result Value   Color, Urine YELLOW (*)    APPearance CLEAR (*)    Hgb urine dipstick SMALL (*)    Bacteria, UA MANY (*)    All other components within normal limits  POCT PREGNANCY, URINE  POC URINE PREG, ED   ____________________________________________  EKG   ____________________________________________  RADIOLOGY  ED MD interpretation:    Official radiology report(s): No results found.  ____________________________________________   PROCEDURES  Procedure(s) performed: None  Procedures  Critical Care performed: No  ____________________________________________   INITIAL IMPRESSION / ASSESSMENT AND PLAN / ED COURSE  As part of my medical decision making, I reviewed the following data within the [place]    Patient presents with left flank pain for 2 days.  Patient denies hematuria or dysuria.  Discussed lab results with patient.  Patient given discharge care instruction advised take medication as directed.  Patient advised to urine retested in 10 days.  Patient given a work note.      ____________________________________________   FINAL CLINICAL IMPRESSION(S) / ED DIAGNOSES  Final diagnoses:  Cystitis     ED Discharge Orders         Ordered    sulfamethoxazole-trimethoprim (BACTRIM DS,SEPTRA DS) 800-160 MG tablet  2 times daily     07/27/18 1534    oxyCODONE-acetaminophen (PERCOCET) 7.5-325 MG tablet  Every 6 hours PRN     07/27/18 1534           Note:  This  document was prepared using Dragon voice recognition software and may include unintentional dictation errors.    Sable Feil, PA-C 07/27/18 1537    Nance Pear, MD 07/28/18 [phone]

## 2018-07-27 NOTE — ED Triage Notes (Signed)
Pt c/o left mid back pain since Wednesday. States she has been taking IBU and using a heating pad with no relief. Denies N.V. Denies hx of kidney stones. Denies pain with urination

## 2019-09-07 ENCOUNTER — Telehealth: Payer: Medicaid Other | Admitting: Nurse Practitioner

## 2019-09-07 DIAGNOSIS — R059 Cough, unspecified: Secondary | ICD-10-CM

## 2019-09-07 DIAGNOSIS — R05 Cough: Secondary | ICD-10-CM

## 2019-09-07 DIAGNOSIS — Z20822 Contact with and (suspected) exposure to covid-19: Secondary | ICD-10-CM

## 2019-09-07 DIAGNOSIS — Z20828 Contact with and (suspected) exposure to other viral communicable diseases: Secondary | ICD-10-CM

## 2019-09-07 DIAGNOSIS — R519 Headache, unspecified: Secondary | ICD-10-CM

## 2019-09-07 MED ORDER — BENZONATATE 100 MG PO CAPS: 100.0000 mg | ORAL_CAPSULE | Freq: Three times a day (TID) | ORAL | 0 refills | Status: DC | PRN

## 2019-09-07 NOTE — Progress Notes (Signed)
E-Visit for Corona Virus Screening   Your current symptoms could be consistent with the coronavirus.  Many health care providers can now test patients at their office but not all are.  [place] has multiple testing sites. For information on our COVID testing locations and hours go to [email or web address]  Please quarantine yourself while awaiting your test results.  We are enrolling you in our [place] for [place] . Daily you will receive a questionnaire within the [place] website. Our COVID 19 response team willl be monitoriing your responses daily. Please continue good preventive care measures, including:  frequent hand-washing, avoid touching your face, cover coughs/sneezes, stay out of crowds and keep a 6 foot distance from others.    You can go to one of the  testing sites listed below, while they are opened (see hours). You do not need a doctors order to be tested for covid.You do need to self-isolate until your results return and if positive 14 days from when your symptoms started and until you are 3 days symptom free.   Testing Locations (Monday - Friday, 10a.m. - 3:30 p.m.)   [place]: [facility] Entrance), [address], [place], [place]: [address], [place], [place], [place] (entrance off [address] (Closed each Monday): [address], [place], [place] - the short stay covered drive at [facility] (Use the [employer] entrance to [facility] next to [place] is a respiratory illness with symptoms that are similar to the flu. Symptoms are typically mild to moderate, but there have been cases of severe illness and death due to the virus. The following symptoms may appear 2-14 days after exposure: . Fever . Cough . Shortness of breath or difficulty  breathing . Chills . Repeated shaking with chills . Muscle pain . Headache . Sore throat . New loss of taste or smell . Fatigue . Congestion or runny nose . Nausea or vomiting . Diarrhea  If you develop fever/cough/breathlessness, please stay home for 10 days with improving symptoms and until you have had 24 hours of no fever (without taking a fever reducer).  Go to the nearest hospital ED for assessment if fever/cough/breathlessness are severe or illness seems like a threat to life.  It is vitally important that if you feel that you have an infection such as this virus or any other virus that you stay home and away from places where you may spread it to others.  You should avoid contact with people age 24 and older.   You should wear a mask or cloth face covering over your nose and mouth if you must be around other people or animals, including pets (even at home). Try to stay at least 6 feet away from other people. This will protect the people around you.  You can use medication such as A prescription cough medication called Tessalon Perles 100 mg. You may take 1-2 capsules every 8 hours as needed for cough  You may also take acetaminophen (Tylenol) as needed for fever.   Reduce your risk of any infection by using the same precautions used for avoiding the common cold or flu:  Marland Kitchen Wash your hands often with soap and warm water for at least 20 seconds.  If soap and water are not readily available, use an alcohol-based hand sanitizer with at least 60% alcohol.  Marland Kitchen  If coughing or sneezing, cover your mouth and nose by coughing or sneezing into the elbow areas of your shirt or coat, into a tissue or into your sleeve (not your hands). . Avoid shaking hands with others and consider head nods or verbal greetings only. . Avoid touching your eyes, nose, or mouth with unwashed hands.  . Avoid close contact with people who are sick. . Avoid places or events with large numbers of people in one location,  like concerts or sporting events. . Carefully consider travel plans you have or are making. . If you are planning any travel outside or inside the [place], visit the CDC's Travelers' Health webpage for the latest health notices. . If you have some symptoms but not all symptoms, continue to monitor at home and seek medical attention if your symptoms worsen. . If you are having a medical emergency, call 911.  HOME CARE . Only take medications as instructed by your medical team. . Drink plenty of fluids and get plenty of rest. . A steam or ultrasonic humidifier can help if you have congestion.   GET HELP RIGHT AWAY IF YOU HAVE EMERGENCY WARNING SIGNS** FOR COVID-19. If you or someone is showing any of these signs seek emergency medical care immediately. Call 911 or proceed to your closest emergency facility if: . You develop worsening high fever. . Trouble breathing . Bluish lips or face . Persistent pain or pressure in the chest . New confusion . Inability to wake or stay awake . You cough up blood. . Your symptoms become more severe  **This list is not all possible symptoms. Contact your medical provider for any symptoms that are sever or concerning to you.   MAKE SURE YOU   Understand these instructions.  Will watch your condition.  Will get help right away if you are not doing well or get worse.  Your e-visit answers were reviewed by a board certified advanced clinical practitioner to complete your personal care plan.  Depending on the condition, your plan could have included both over the counter or prescription medications.  If there is a problem please reply once you have received a response from your provider.  Your safety is important to [place].  If you have drug allergies check your prescription carefully.    You can use MyChart to ask questions about today's visit, request a non-urgent call back, or ask for a work or school excuse for 24 hours related to this e-Visit. If it has  been greater than 24 hours you will need to follow up with your provider, or enter a new e-Visit to address those concerns. You will get an e-mail in the next two days asking about your experience.  I hope that your e-visit has been valuable and will speed your recovery. Thank you for using e-visits.   5-10 minutes spent reviewing and documenting in chart.

## 2020-03-03 ENCOUNTER — Observation Stay: Payer: Medicaid Other

## 2020-03-03 ENCOUNTER — Encounter
Admission: EM | Disposition: A | Discharge status: Home / Self Care | Payer: Self-pay | Source: Home / Self Care | Attending: Emergency Medicine

## 2020-03-03 ENCOUNTER — Other Ambulatory Visit: Payer: Self-pay

## 2020-03-03 ENCOUNTER — Observation Stay
Admission: EM | Admit: 2020-03-03 | Discharge: 2020-03-04 | Disposition: A | Discharge status: Home / Self Care | Payer: Medicaid Other | Attending: Surgery | Admitting: Surgery

## 2020-03-03 ENCOUNTER — Emergency Department: Payer: Medicaid Other

## 2020-03-03 ENCOUNTER — Observation Stay: Payer: Medicaid Other | Admitting: Anesthesiology

## 2020-03-03 DIAGNOSIS — R1011 Right upper quadrant pain: Secondary | ICD-10-CM | POA: Diagnosis present

## 2020-03-03 DIAGNOSIS — Z87442 Personal history of urinary calculi: Secondary | ICD-10-CM | POA: Insufficient documentation

## 2020-03-03 DIAGNOSIS — K8010 Calculus of gallbladder with chronic cholecystitis without obstruction: Secondary | ICD-10-CM | POA: Diagnosis not present

## 2020-03-03 DIAGNOSIS — K8050 Calculus of bile duct without cholangitis or cholecystitis without obstruction: Secondary | ICD-10-CM | POA: Diagnosis present

## 2020-03-03 DIAGNOSIS — Z20822 Contact with and (suspected) exposure to covid-19: Secondary | ICD-10-CM | POA: Insufficient documentation

## 2020-03-03 LAB — HEPATIC FUNCTION PANEL
ALT: 76 U/L — ABNORMAL HIGH (ref 0–44)
AST: 97 U/L — ABNORMAL HIGH (ref 15–41)
Albumin: 3.4 g/dL — ABNORMAL LOW (ref 3.5–5.0)
Alkaline Phosphatase: 134 U/L — ABNORMAL HIGH (ref 38–126)
Bilirubin, Direct: 0.2 mg/dL (ref 0.0–0.2)
Indirect Bilirubin: 0.6 mg/dL (ref 0.3–0.9)
Total Bilirubin: 0.8 mg/dL (ref 0.3–1.2)
Total Protein: 7 g/dL (ref 6.5–8.1)

## 2020-03-03 LAB — CBC
HCT: 36.9 % (ref 36.0–46.0)
Hemoglobin: 12.2 g/dL (ref 12.0–15.0)
MCH: 28.8 pg (ref 26.0–34.0)
MCHC: 33.1 g/dL (ref 30.0–36.0)
MCV: 87 fL (ref 80.0–100.0)
Platelets: 230 10*3/uL (ref 150–400)
RBC: 4.24 MIL/uL (ref 3.87–5.11)
RDW: 14.4 % (ref 11.5–15.5)
WBC: 14.2 10*3/uL — ABNORMAL HIGH (ref 4.0–10.5)
nRBC: 0 % (ref 0.0–0.2)

## 2020-03-03 LAB — URINALYSIS, COMPLETE (UACMP) WITH MICROSCOPIC
Bilirubin Urine: NEGATIVE
Glucose, UA: NEGATIVE mg/dL
Hgb urine dipstick: NEGATIVE
Ketones, ur: NEGATIVE mg/dL
Leukocytes,Ua: NEGATIVE
Nitrite: NEGATIVE
Protein, ur: NEGATIVE mg/dL
Specific Gravity, Urine: 1.011 (ref 1.005–1.030)
pH: 6 (ref 5.0–8.0)

## 2020-03-03 LAB — SARS CORONAVIRUS 2 BY RT PCR (HOSPITAL ORDER, PERFORMED IN ~~LOC~~ HOSPITAL LAB): SARS Coronavirus 2: NEGATIVE

## 2020-03-03 LAB — BASIC METABOLIC PANEL
Anion gap: 7 (ref 5–15)
BUN: 7 mg/dL (ref 6–20)
CO2: 25 mmol/L (ref 22–32)
Calcium: 8.7 mg/dL — ABNORMAL LOW (ref 8.9–10.3)
Chloride: 103 mmol/L (ref 98–111)
Creatinine, Ser: 0.63 mg/dL (ref 0.44–1.00)
GFR calc Af Amer: >60 mL/min (ref >60)
GFR calc non Af Amer: >60 mL/min (ref >60)
Glucose, Bld: 110 mg/dL — ABNORMAL HIGH (ref 70–99)
Potassium: 3.7 mmol/L (ref 3.5–5.1)
Sodium: 135 mmol/L (ref 135–145)

## 2020-03-03 SURGERY — CHOLECYSTECTOMY, ROBOT-ASSISTED, LAPAROSCOPIC
Anesthesia: Choice

## 2020-03-03 SURGERY — CHOLECYSTECTOMY, ROBOT-ASSISTED, LAPAROSCOPIC
Anesthesia: General | Site: Abdomen

## 2020-03-03 MED ORDER — FENTANYL CITRATE (PF) 100 MCG/2ML IJ SOLN
INTRAMUSCULAR | Status: AC
  Filled 2020-03-03: qty 2

## 2020-03-03 MED ORDER — DOCUSATE SODIUM 100 MG PO CAPS: 100.0000 mg | ORAL_CAPSULE | Freq: Two times a day (BID) | ORAL | Status: DC | PRN

## 2020-03-03 MED ORDER — ROCURONIUM BROMIDE 100 MG/10ML IV SOLN
INTRAVENOUS | Status: DC | PRN
  Administered 2020-03-03: 30 mg via INTRAVENOUS
  Administered 2020-03-03: 50 mg via INTRAVENOUS

## 2020-03-03 MED ORDER — ACETAMINOPHEN 10 MG/ML IV SOLN
INTRAVENOUS | Status: DC | PRN
  Administered 2020-03-03: 1000 mg via INTRAVENOUS

## 2020-03-03 MED ORDER — DEXAMETHASONE SODIUM PHOSPHATE 10 MG/ML IJ SOLN
INTRAMUSCULAR | Status: DC | PRN
  Administered 2020-03-03: 10 mg via INTRAVENOUS

## 2020-03-03 MED ORDER — LACTATED RINGERS IV SOLN: INTRAVENOUS | Status: DC | PRN

## 2020-03-03 MED ORDER — ACETAMINOPHEN 10 MG/ML IV SOLN
INTRAVENOUS | Status: AC
  Filled 2020-03-03: qty 100

## 2020-03-03 MED ORDER — LIDOCAINE HCL (PF) 2 % IJ SOLN
INTRAMUSCULAR | Status: AC
  Filled 2020-03-03: qty 5

## 2020-03-03 MED ORDER — ONDANSETRON HCL 4 MG/2ML IJ SOLN
4.0000 mg | Freq: Once | INTRAMUSCULAR | Status: AC
  Administered 2020-03-03: 4 mg via INTRAVENOUS
  Filled 2020-03-03: qty 2

## 2020-03-03 MED ORDER — FENTANYL CITRATE (PF) 100 MCG/2ML IJ SOLN
INTRAMUSCULAR | Status: DC | PRN
  Administered 2020-03-03: 100 ug via INTRAVENOUS
  Administered 2020-03-03 (×2): 50 ug via INTRAVENOUS

## 2020-03-03 MED ORDER — METRONIDAZOLE IN NACL 5-0.79 MG/ML-% IV SOLN
500.0000 mg | Freq: Three times a day (TID) | INTRAVENOUS | Status: DC
  Administered 2020-03-03: 500 mg via INTRAVENOUS
  Filled 2020-03-03 (×4): qty 100

## 2020-03-03 MED ORDER — GADOBUTROL 1 MMOL/ML IV SOLN
9.0000 mL | Freq: Once | INTRAVENOUS | Status: AC | PRN
  Administered 2020-03-03: 9 mL via INTRAVENOUS

## 2020-03-03 MED ORDER — PHENYLEPHRINE HCL (PRESSORS) 10 MG/ML IV SOLN
INTRAVENOUS | Status: DC | PRN
  Administered 2020-03-03 (×3): 100 ug via INTRAVENOUS

## 2020-03-03 MED ORDER — INDOCYANINE GREEN 25 MG IV SOLR: 1.2500 mg | Freq: Once | INTRAVENOUS | Status: DC

## 2020-03-03 MED ORDER — INDOCYANINE GREEN 25 MG IV SOLR
INTRAVENOUS | Status: DC | PRN
Start: 2020-03-03 — End: 2020-03-03
  Administered 2020-03-03: 1.25 mg via INTRAVENOUS

## 2020-03-03 MED ORDER — CIPROFLOXACIN IN D5W 400 MG/200ML IV SOLN
400.0000 mg | Freq: Two times a day (BID) | INTRAVENOUS | Status: DC
  Administered 2020-03-03: 400 mg via INTRAVENOUS
  Filled 2020-03-03 (×3): qty 200

## 2020-03-03 MED ORDER — ONDANSETRON HCL 4 MG/2ML IJ SOLN
INTRAMUSCULAR | Status: AC
  Filled 2020-03-03: qty 2

## 2020-03-03 MED ORDER — KETOROLAC TROMETHAMINE 30 MG/ML IJ SOLN
INTRAMUSCULAR | Status: DC | PRN
  Administered 2020-03-03: 30 mg via INTRAVENOUS

## 2020-03-03 MED ORDER — HYDROCODONE-ACETAMINOPHEN 5-325 MG PO TABS: 1.0000 | ORAL_TABLET | ORAL | Status: DC | PRN

## 2020-03-03 MED ORDER — TRAMADOL HCL 50 MG PO TABS: 50.0000 mg | ORAL_TABLET | Freq: Four times a day (QID) | ORAL | Status: DC | PRN

## 2020-03-03 MED ORDER — DEXAMETHASONE SODIUM PHOSPHATE 10 MG/ML IJ SOLN
INTRAMUSCULAR | Status: AC
  Filled 2020-03-03: qty 1

## 2020-03-03 MED ORDER — ACETAMINOPHEN 325 MG PO TABS: 650.0000 mg | ORAL_TABLET | Freq: Four times a day (QID) | ORAL | Status: DC | PRN

## 2020-03-03 MED ORDER — PROPOFOL 10 MG/ML IV BOLUS
INTRAVENOUS | Status: AC
  Filled 2020-03-03: qty 20

## 2020-03-03 MED ORDER — MORPHINE SULFATE (PF) 2 MG/ML IV SOLN
1.0000 mg | INTRAVENOUS | Status: DC | PRN
  Administered 2020-03-03: 1 mg via INTRAVENOUS
  Filled 2020-03-03: qty 1

## 2020-03-03 MED ORDER — OXYCODONE HCL 5 MG PO TABS: 5.0000 mg | ORAL_TABLET | Freq: Once | ORAL | Status: DC | PRN

## 2020-03-03 MED ORDER — DIPHENHYDRAMINE HCL 50 MG/ML IJ SOLN
25.0000 mg | Freq: Once | INTRAMUSCULAR | Status: AC
  Administered 2020-03-03: 25 mg via INTRAVENOUS
  Filled 2020-03-03: qty 1

## 2020-03-03 MED ORDER — ONDANSETRON 4 MG PO TBDP: 4.0000 mg | ORAL_TABLET | Freq: Four times a day (QID) | ORAL | Status: DC | PRN

## 2020-03-03 MED ORDER — IBUPROFEN 400 MG PO TABS: 600.0000 mg | ORAL_TABLET | Freq: Four times a day (QID) | ORAL | Status: DC | PRN

## 2020-03-03 MED ORDER — DEXMEDETOMIDINE HCL IN NACL 400 MCG/100ML IV SOLN
INTRAVENOUS | Status: DC | PRN
  Administered 2020-03-03: 8 ug via INTRAVENOUS
  Administered 2020-03-03: 12 ug via INTRAVENOUS
  Administered 2020-03-03: 8 ug via INTRAVENOUS

## 2020-03-03 MED ORDER — HYDROCODONE-ACETAMINOPHEN 5-325 MG PO TABS
1.0000 | ORAL_TABLET | Freq: Four times a day (QID) | ORAL | Status: DC | PRN
  Administered 2020-03-04: 1 via ORAL
  Filled 2020-03-03: qty 1

## 2020-03-03 MED ORDER — BUPIVACAINE HCL 0.5 % IJ SOLN
INTRAMUSCULAR | Status: DC | PRN
  Administered 2020-03-03: 10 mL

## 2020-03-03 MED ORDER — ROCURONIUM BROMIDE 10 MG/ML (PF) SYRINGE
PREFILLED_SYRINGE | INTRAVENOUS | Status: AC
  Filled 2020-03-03: qty 10

## 2020-03-03 MED ORDER — SUCCINYLCHOLINE CHLORIDE 200 MG/10ML IV SOSY
PREFILLED_SYRINGE | INTRAVENOUS | Status: AC
  Filled 2020-03-03: qty 10

## 2020-03-03 MED ORDER — LIDOCAINE-EPINEPHRINE (PF) 1 %-1:200000 IJ SOLN
INTRAMUSCULAR | Status: DC | PRN
  Administered 2020-03-03: 10 mL

## 2020-03-03 MED ORDER — MIDAZOLAM HCL 2 MG/2ML IJ SOLN
INTRAMUSCULAR | Status: AC
  Filled 2020-03-03: qty 2

## 2020-03-03 MED ORDER — ONDANSETRON HCL 4 MG/2ML IJ SOLN
INTRAMUSCULAR | Status: DC | PRN
  Administered 2020-03-03: 4 mg via INTRAVENOUS

## 2020-03-03 MED ORDER — SODIUM CHLORIDE 0.9 % IV SOLN: INTRAVENOUS | Status: DC

## 2020-03-03 MED ORDER — SUGAMMADEX SODIUM 200 MG/2ML IV SOLN
INTRAVENOUS | Status: DC | PRN
  Administered 2020-03-03: 200 mg via INTRAVENOUS

## 2020-03-03 MED ORDER — MORPHINE SULFATE (PF) 4 MG/ML IV SOLN
4.0000 mg | Freq: Once | INTRAVENOUS | Status: AC
  Administered 2020-03-03: 4 mg via INTRAVENOUS
  Filled 2020-03-03: qty 1

## 2020-03-03 MED ORDER — FENTANYL CITRATE (PF) 100 MCG/2ML IJ SOLN: 25.0000 ug | INTRAMUSCULAR | Status: DC | PRN

## 2020-03-03 MED ORDER — SUCCINYLCHOLINE CHLORIDE 20 MG/ML IJ SOLN
INTRAMUSCULAR | Status: DC | PRN
  Administered 2020-03-03: 100 mg via INTRAVENOUS

## 2020-03-03 MED ORDER — KETOROLAC TROMETHAMINE 30 MG/ML IJ SOLN
INTRAMUSCULAR | Status: AC
  Filled 2020-03-03: qty 1

## 2020-03-03 MED ORDER — PROPOFOL 10 MG/ML IV BOLUS
INTRAVENOUS | Status: DC | PRN
  Administered 2020-03-03: 160 mg via INTRAVENOUS

## 2020-03-03 MED ORDER — OXYCODONE HCL 5 MG/5ML PO SOLN: 5.0000 mg | Freq: Once | ORAL | Status: DC | PRN

## 2020-03-03 MED ORDER — MIDAZOLAM HCL 2 MG/2ML IJ SOLN
INTRAMUSCULAR | Status: DC | PRN
  Administered 2020-03-03: 2 mg via INTRAVENOUS

## 2020-03-03 MED ORDER — LIDOCAINE HCL (CARDIAC) PF 100 MG/5ML IV SOSY
PREFILLED_SYRINGE | INTRAVENOUS | Status: DC | PRN
  Administered 2020-03-03: 100 mg via INTRAVENOUS

## 2020-03-03 MED ORDER — MORPHINE SULFATE (PF) 4 MG/ML IV SOLN: 2.0000 mg | INTRAVENOUS | Status: DC | PRN

## 2020-03-03 MED ORDER — ENOXAPARIN SODIUM 40 MG/0.4ML ~~LOC~~ SOLN: 40.0000 mg | SUBCUTANEOUS | Status: DC

## 2020-03-03 MED ORDER — ONDANSETRON HCL 4 MG/2ML IJ SOLN: 4.0000 mg | Freq: Four times a day (QID) | INTRAMUSCULAR | Status: DC | PRN

## 2020-03-03 SURGICAL SUPPLY — 58 items
"PENCIL ELECTRO HAND CTR " (MISCELLANEOUS) ×2 IMPLANT
ANCHOR TIS RET SYS 235ML (MISCELLANEOUS) ×3 IMPLANT
BAG INFUSER PRESSURE 100CC (MISCELLANEOUS) IMPLANT
BLADE SURG SZ11 CARB STEEL (BLADE) ×3 IMPLANT
CANISTER SUCT 1200ML W/VALVE (MISCELLANEOUS) ×3 IMPLANT
CANNULA REDUC XI 12-8 STAPL (CANNULA) ×3
CANNULA REDUCER 12-8 DVNC XI (CANNULA) ×2 IMPLANT
CHLORAPREP W/TINT 26 (MISCELLANEOUS) ×3 IMPLANT
CLIP VESOLOCK MED LG 6/CT (CLIP) ×3 IMPLANT
COVER LIGHT HANDLE STERIS (MISCELLANEOUS) ×1 IMPLANT
COVER TIP SHEARS 8 DVNC (MISCELLANEOUS) ×2 IMPLANT
COVER TIP SHEARS 8MM DA VINCI (MISCELLANEOUS)
COVER WAND RF STERILE (DRAPES) ×3 IMPLANT
DECANTER SPIKE VIAL GLASS SM (MISCELLANEOUS) ×6 IMPLANT
DEFOGGER SCOPE WARMER CLEARIFY (MISCELLANEOUS) ×3 IMPLANT
DERMABOND ADVANCED (GAUZE/BANDAGES/DRESSINGS) ×1
DERMABOND ADVANCED .7 DNX12 (GAUZE/BANDAGES/DRESSINGS) ×2 IMPLANT
DRAPE ARM DVNC X/XI (DISPOSABLE) ×8 IMPLANT
DRAPE COLUMN DVNC XI (DISPOSABLE) ×2 IMPLANT
DRAPE DA VINCI XI ARM (DISPOSABLE) ×12
DRAPE DA VINCI XI COLUMN (DISPOSABLE) ×3
ELECT CAUTERY BLADE 6.4 (BLADE) ×3 IMPLANT
ELECT REM PT RETURN 9FT ADLT (ELECTROSURGICAL) ×3
ELECTRODE REM PT RTRN 9FT ADLT (ELECTROSURGICAL) ×2 IMPLANT
GLOVE BIOGEL PI IND STRL 7.0 (GLOVE) ×4 IMPLANT
GLOVE BIOGEL PI INDICATOR 7.0 (GLOVE) ×2
GLOVE SURG SYN 6.5 ES PF (GLOVE) ×6 IMPLANT
GLOVE SURG SYN 6.5 PF PI (GLOVE) ×4 IMPLANT
GOWN STRL REUS W/ TWL LRG LVL3 (GOWN DISPOSABLE) ×6 IMPLANT
GOWN STRL REUS W/TWL LRG LVL3 (GOWN DISPOSABLE) ×9
GRASPER SUT TROCAR 14GX15 (MISCELLANEOUS) IMPLANT
IRRIGATOR SUCT 8 DISP DVNC XI (IRRIGATION / IRRIGATOR) IMPLANT
IRRIGATOR SUCTION 8MM XI DISP (IRRIGATION / IRRIGATOR)
IV NS 1000ML (IV SOLUTION)
IV NS 1000ML BAXH (IV SOLUTION) IMPLANT
KIT IMAGING PINPOINTPAQ (MISCELLANEOUS) ×1 IMPLANT
LABEL OR SOLS (LABEL) ×3 IMPLANT
NDL INSUFFLATION 14GA 120MM (NEEDLE) ×2 IMPLANT
NEEDLE HYPO 22GX1.5 SAFETY (NEEDLE) ×3 IMPLANT
NEEDLE INSUFFLATION 14GA 120MM (NEEDLE) ×3 IMPLANT
NS IRRIG 500ML POUR BTL (IV SOLUTION) ×3 IMPLANT
OBTURATOR OPTICAL STANDARD 8MM (TROCAR) ×3
OBTURATOR OPTICAL STND 8 DVNC (TROCAR) ×2
OBTURATOR OPTICALSTD 8 DVNC (TROCAR) ×2 IMPLANT
PACK LAP CHOLECYSTECTOMY (MISCELLANEOUS) ×3 IMPLANT
PENCIL ELECTRO HAND CTR (MISCELLANEOUS) ×3 IMPLANT
SEAL CANN UNIV 5-8 DVNC XI (MISCELLANEOUS) ×6 IMPLANT
SEAL XI 5MM-8MM UNIVERSAL (MISCELLANEOUS) ×9
SET TUBE SMOKE EVAC HIGH FLOW (TUBING) ×3 IMPLANT
SOLUTION ELECTROLUBE (MISCELLANEOUS) ×3 IMPLANT
STAPLER CANNULA SEAL DVNC XI (STAPLE) ×2 IMPLANT
STAPLER CANNULA SEAL XI (STAPLE) ×3
SUT MNCRL 4-0 (SUTURE) ×3
SUT MNCRL 4-0 27XMFL (SUTURE) ×2
SUT SILK 3 0 SH 30 (SUTURE) ×1 IMPLANT
SUT VICRYL 0 AB UR-6 (SUTURE) ×4 IMPLANT
SUTURE MNCRL 4-0 27XMF (SUTURE) ×4 IMPLANT
TROCAR XCEL NON-BLD 5MMX100MML (ENDOMECHANICALS) ×1 IMPLANT

## 2020-03-03 NOTE — ED Notes (Signed)
This RN to bedside, pt visualized in NAD at this time, denies any needs. Call bell within reach. Pt offered TV remote, lights to be dimmed, pt refused. Pt A&O x4, states pain 2/10 at this time. Explained plan of care to patient. Pt states understanding.

## 2020-03-03 NOTE — ED Notes (Signed)
Pt transferred to OR at this time.

## 2020-03-03 NOTE — Anesthesia Postprocedure Evaluation (Signed)
Anesthesia Post Note  Patient: [name]  Procedure(s) Performed: XI ROBOTIC ASSISTED LAPAROSCOPIC CHOLECYSTECTOMY (N/A Abdomen) INDOCYANINE GREEN FLUORESCENCE IMAGING (ICG) (N/A )  Patient location during evaluation: PACU Anesthesia Type: General Level of consciousness: awake and alert Pain management: pain level controlled Vital Signs Assessment: post-procedure vital signs reviewed and stable Respiratory status: spontaneous breathing, nonlabored ventilation, respiratory function stable and patient connected to nasal cannula oxygen Cardiovascular status: blood pressure returned to baseline and stable Postop Assessment: no apparent nausea or vomiting Anesthetic complications: no     Last Vitals:  Vitals:   03/03/20 2009 03/03/20 2043  BP:  (!) 91/53  Pulse:  88  Resp:  20  Temp: (!) 32 C 37.2 C  SpO2:  97%    Last Pain:  Vitals:   03/03/20 2043  TempSrc: Oral  PainSc:                  Precious Haws Matan Steen

## 2020-03-03 NOTE — ED Notes (Signed)
Pt transported to MRI 

## 2020-03-03 NOTE — Op Note (Signed)
Preoperative diagnosis:  acute and cholecystitis  Postoperative diagnosis: same as above  Procedure: Robotic assisted Laparoscopic Cholecystectomy.   Anesthesia: GETA   Surgeon: Benjamine Sprague  Specimen: Gallbladder  Complications: None  EBL: 61mL  Wound Classification: Clean Contaminated  Indications: see HPI  Findings:  Cystic duct and artery identified, ligated and divided, clips remained intact at end of procedure Adequate hemostasis  Description of procedure:  The patient was placed on the operating table in the supine position. SCDs placed, pre-op abx administered.  General anesthesia was induced and OG tube placed by anesthesia. A time-out was completed verifying correct patient, procedure, site, positioning, and implant(s) and/or special equipment prior to beginning this procedure. The abdomen was prepped and draped in the usual sterile fashion.    Veress needle was placed at the Palmer's point and insufflation was started after confirming a positive saline drop test and no immediate increase in abdominal pressure.  After reaching 15 mm, the Veress needle was removed and a 8 mm port was placed via optiview technique under umbilicus measured 0000000 from gallbladder.  The abdomen was inspected and no abnormalities or injuries were found.  Under direct vision, ports were placed in the following locations: One 12 mm patient left of the umbilicus, 8cm from the optiviewed port, one 8 mm port placed to the patient right of the umbilical port 8 cm apart.  1 additional 8 mm port placed lateral to the 52mm port.  Once ports were placed, The table was placed in the reverse Trendelenburg position with the right side up. The Xi platform was brought into the operative field and docked to the ports successfully.  An endoscope was placed through the umbilical port, fenestrated grasper through the adjacent patient right port, prograsp to the far patient left port, and then a hook cautery in the left  port.  The dome of the gallbladder was grasped with prograsp, passed and retracted over the dome of the liver. Adhesions between the gallbladder and omentum, duodenum and transverse colon were lysed via hook cautery. The infundibulum was grasped with the fenestrated grasper and retracted toward the right lower quadrant. This maneuver exposed Calot's triangle. The peritoneum overlying the gallbladder infundibulum was then dissected using celectrocautery hook and the cystic duct and cystic artery eventually identified.  The cystic duct and cystic artery clipped and divided close to the gallbladder.     The gallbladder was then dissected from its peritoneal and liver bed attachments by electrocautery.  There was no evidence of bleeding from the gallbladder fossa or cystic artery or leakage of the bile from the cystic duct stump. Hemostasis was checked prior to removing the hook cautery and the Endo Catch bag was then placed through the 12 mm port and the gallbladder was removed. Abdomen desufflated. Bleeding noted at the port site when the incision extended to accomodate large gallbladder was controlled with figure of eight 3-0 silk x2.  The gallbladder was passed off the table as a specimen. The 12 mm port site closed with 0 vicryl under direct visualization.  No bleeding was noted. Remaining ports removed and all skin incisions then closed with subcuticular sutures of 4-0 monocryl and dressed with topical skin adhesive. The orogastric tube was removed and patient extubated.  The patient tolerated the procedure well and was taken to the postanesthesia care unit in stable condition.  All sponge and instrument count correct at end of procedure.

## 2020-03-03 NOTE — ED Notes (Signed)
Pt transported to CT at this time.

## 2020-03-03 NOTE — Anesthesia Preprocedure Evaluation (Signed)
Anesthesia Evaluation  Patient identified by MRN, date of birth, ID band Patient awake    Reviewed: Allergy & Precautions, H&P , NPO status , Patient's Chart, lab work & pertinent test results  History of Anesthesia Complications (+) AWARENESS UNDER ANESTHESIA and history of anesthetic complications  Airway Mallampati: II  TM Distance: >3 FB Neck ROM: full    Dental  (+) Chipped   Pulmonary neg shortness of breath, asthma ,    Pulmonary exam normal        Cardiovascular Exercise Tolerance: Good (-) angina(-) Past MI and (-) DOE negative cardio ROS Normal cardiovascular exam     Neuro/Psych Seizures - (as a child), Well Controlled,  PSYCHIATRIC DISORDERS negative psych ROS   GI/Hepatic negative GI ROS, Neg liver ROS, neg GERD  ,  Endo/Other  negative endocrine ROS  Renal/GU Renal disease     Musculoskeletal   Abdominal   Peds  Hematology negative hematology ROS (+)   Anesthesia Other Findings Past Medical History: No date: Anxiety No date: Asthma No date: Chronic back pain No date: Depression  Past Surgical History: No date: ABDOMINAL HYSTERECTOMY No date: APPENDECTOMY No date: CESAREAN SECTION No date: DILATION AND CURETTAGE OF UTERUS  BMI    Body Mass Index: 33.52 kg/m      Reproductive/Obstetrics negative OB ROS                             Anesthesia Physical Anesthesia Plan  ASA: III  Anesthesia Plan: General ETT   Post-op Pain Management:    Induction: Intravenous  PONV Risk Score and Plan: Ondansetron, Dexamethasone, Midazolam and Treatment may vary due to age or medical condition  Airway Management Planned: Oral ETT  Additional Equipment:   Intra-op Plan:   Post-operative Plan: Extubation in OR  Informed Consent: I have reviewed the patients History and Physical, chart, labs and discussed the procedure including the risks, benefits and alternatives for  the proposed anesthesia with the patient or authorized representative who has indicated his/her understanding and acceptance.     Dental Advisory Given  Plan Discussed with: Anesthesiologist, CRNA and Surgeon  Anesthesia Plan Comments: (Patient consented for risks of anesthesia including but not limited to:  - adverse reactions to medications - damage to eyes, teeth, lips or other oral mucosa - nerve damage due to positioning  - sore throat or hoarseness - Damage to heart, brain, nerves, lungs or loss of life  Patient voiced understanding.)        Anesthesia Quick Evaluation

## 2020-03-03 NOTE — ED Notes (Signed)
Pt transported to US at this time. 

## 2020-03-03 NOTE — ED Notes (Signed)
Pt started having reaction to ciprofloxacin IV. Pt started experiencing rt arm redness and itching extending from IV site. MD notified and assessed pt. MD ordered IV benadryl and states to slow rate of medication but continue to administer at this time. Pt notified to contact staff using call light if itching, redness continued to spread.

## 2020-03-03 NOTE — ED Provider Notes (Signed)
[facility] Emergency Department Provider Note       Time seen: ----------------------------------------- 7:53 AM on 03/03/2020 -----------------------------------------   I have reviewed the vital signs and the nursing notes.  HISTORY   Chief Complaint Flank Pain    HPI [name] is a 39 y.o. female with a history of anxiety, asthma, chronic back pain, depression who presents today for right flank pain for the past 2 weeks.  Patient was diagnosed with a kidney stone 2 weeks ago and she states she has been miserable since.  Pain is 8 out of 10.  Patient is also had fevers.  Past Medical History:  Diagnosis Date  . Anxiety   . Asthma   . Chronic back pain   . Depression     Past Surgical History:  Procedure Laterality Date  . ABDOMINAL HYSTERECTOMY    . APPENDECTOMY    . CESAREAN SECTION    . DILATION AND CURETTAGE OF UTERUS      Allergies Bee venom, Fish allergy, Latex, and Penicillins   Review of Systems Constitutional: Negative for fever. Cardiovascular: Negative for chest pain. Respiratory: Negative for shortness of breath. Gastrointestinal: Positive for flank pain Musculoskeletal: Negative for back pain. Skin: Negative for rash. Neurological: Negative for headaches, focal weakness or numbness.  All systems negative/normal/unremarkable except as stated in the HPI  ____________________________________________   PHYSICAL EXAM:  VITAL SIGNS: ED Triage Vitals  Enc Vitals Group     BP 03/03/20 0722 127/79     Pulse Rate 03/03/20 0722 (!) 114     Resp 03/03/20 0722 18     Temp 03/03/20 0722 98.5 F (36.9 C)     Temp Source 03/03/20 0722 Oral     SpO2 03/03/20 0722 97 %     Weight 03/03/20 0723 214 lb (97.1 kg)     Height 03/03/20 0723 5\' 7"  (1.702 m)     Head Circumference --      Peak Flow --      Pain Score 03/03/20 0723 8     Pain Loc --      Pain Edu? --      Excl. in [place]? --     Constitutional: Alert and oriented. Well  appearing and in no distress. Eyes: Conjunctivae are normal. Normal extraocular movements. ENT      Head: Normocephalic and atraumatic.      Nose: No congestion/rhinnorhea.      Mouth/Throat: Mucous membranes are moist.      Neck: No stridor. Cardiovascular: Normal rate, regular rhythm. No murmurs, rubs, or gallops. Respiratory: Normal respiratory effort without tachypnea nor retractions. Breath sounds are clear and equal bilaterally. No wheezes/rales/rhonchi. Gastrointestinal: Right flank tenderness, no rebound or guarding.  Normal bowel sounds. Musculoskeletal: Nontender with normal range of motion in extremities. No lower extremity tenderness nor edema. Neurologic:  Normal speech and language. No gross focal neurologic deficits are appreciated.  Skin:  Skin is warm, dry and intact. No rash noted. Psychiatric: Speech and behavior are normal.  ___________________________________________   LABS (pertinent positives/negatives)  Labs Reviewed  URINALYSIS, COMPLETE (UACMP) WITH MICROSCOPIC - Abnormal; Notable for the following components:      Result Value   Color, Urine YELLOW (*)    APPearance HAZY (*)    Bacteria, UA RARE (*)    All other components within normal limits  BASIC METABOLIC PANEL - Abnormal; Notable for the following components:   Glucose, Bld 110 (*)    Calcium 8.7 (*)    All  other components within normal limits  CBC - Abnormal; Notable for the following components:   WBC 14.2 (*)    All other components within normal limits  HEPATIC FUNCTION PANEL - Abnormal; Notable for the following components:   Albumin 3.4 (*)    AST 97 (*)    ALT 76 (*)    Alkaline Phosphatase 134 (*)    All other components within normal limits    RADIOLOGY  Images were viewed by me IMPRESSION: 1. 3 mm nonobstructing mid right renal calculus. 2. Probable mild sludge in the gallbladder and possible tiny noncalcified gallstones in the gallbladder. 3. Complete pancreatic  atrophy. IMPRESSION: Sludge and questionable tiny gallstones within the gallbladder. No sonographic evidence of acute cholecystitis. The common duct is normal in caliber.  There are several hypoechoic foci within the right hepatic lobe. Primary differential considerations include liver masses versus areas of focal fatty sparing. Contrast-enhanced abdominal MRI is recommended for further evaluation.  Generalized hyperechogenicity of the hepatic parenchyma. This is a nonspecific finding, which may be seen in the setting of hepatic steatosis or other chronic hepatic parenchymal disease.   DIFFERENTIAL DIAGNOSIS  Renal colic, UTI, pyelonephritis, muscle strain  ASSESSMENT AND PLAN  Gallstones   Plan: The patient had presented for right flank pain. Patient's labs did reveal leukocytosis and elevation in her LFTs. Patient's imaging revealed gallstones and sludge in the gallbladder.  No obvious signs of cholecystitis, however with her white count and elevated liver transaminases I will discuss with general surgery for further evaluation.  Lenise Arena MD    Note: This note was generated in part or whole with voice recognition software. Voice recognition is usually quite accurate but there are transcription errors that can and very often do occur. I apologize for any typographical errors that were not detected and corrected.     Earleen Newport, MD 03/03/20 [phone]

## 2020-03-03 NOTE — H&P (Signed)
Subjective:   CC: Right upper quadrant and flank pain, fever, possible liver mass  HPI:  [name] is a 38 y.o. female who is consulted by [facility] for evaluation of above cc.  Symptoms were first noted 2 weeks ago. Pain is sharp localized to right flank and right upper quadrant area, associated with night sweats and episodic fevers up to 102 documented, exacerbated by activities throughout the day.  Describes the pain as sudden onset, no specific instigating factor.  Pain is manageable at the beginning of the day but worsens throughout Tylenol and Advil has not improved the pain significantly.  Previous work-up at urgent care in outside emergency department noted possible pyelonephritis, with reported positive E. coli urine.  Completed couple rounds of different antibiotics but pain has persisted.  He has also started developing a nonproductive dry cough a few days ago, with no known sick contacts or recent travel.  Noted to have recent constipation of unknown cause.  Work-up in ED today showed slightly elevated alk phos in AST, ALT, with sludge but no obvious signs of cholecystitis.  Incidental liver masses noted on CT that were not noted on the noncontrast CT renal stone studies.     Past Medical History:  has a past medical history of Anxiety, Asthma, Chronic back pain, and Depression.  Past Surgical History:  has a past surgical history that includes Appendectomy; Cesarean section; Abdominal hysterectomy; and Dilation and curettage of uterus.  Family History: family history includes Anxiety disorder in her mother; Breast cancer (age of onset: 42) in her maternal aunt; CAD in her father; Irritable bowel syndrome in her mother.  Social History:  reports that she has never smoked. She has never used smokeless tobacco. She reports current alcohol use. She reports that she does not use drugs.  Current Medications:   Allergies:  Allergies as of 03/03/2020 - Review Complete 03/03/2020   Allergen Reaction Noted  . Bee venom Shortness Of Breath and Swelling 08/21/2015  . Fish allergy Anaphylaxis and Shortness Of Breath 08/21/2015  . Latex Hives, Shortness Of Breath, Swelling, and Rash 08/21/2015  . Penicillins Hives and Itching 08/21/2015    ROS:  General: Denies weight loss, weight gain, fatigue. Eyes: Denies blurry vision, double vision, eye pain, itchy eyes, and tearing. Ears: Denies hearing loss, earache, and ringing in ears. Nose: Denies sinus pain, congestion, infections, runny nose, and nosebleeds. Mouth/throat: Denies hoarseness, sore throat, bleeding gums, and difficulty swallowing. Heart: Denies chest pain, palpitations, racing heart, irregular heartbeat, leg pain or swelling, and decreased activity tolerance. Respiratory: Denies breathing difficulty, shortness of breath, wheezing,  GI: Denies change in appetite, heartburn, nausea, vomiting,  diarrhea, and blood in stool. GU: Denies difficulty urinating, pain with urinating, urgency, frequency, blood in urine. Musculoskeletal: Denies joint stiffness, pain, swelling, muscle weakness. Skin: Denies rash, itching, mass, tumors, sores, and boils Neurologic: Denies headache, fainting, dizziness, seizures, numbness, and tingling. Psychiatric: Denies depression, anxiety, difficulty sleeping, and memory loss. Endocrine: Denies heat or cold intolerance, and increased thirst or urination. Blood/lymph: Denies easy bruising, and swollen glands     Objective:     BP 110/70   Pulse 100   Temp 98.5 F (36.9 C) (Oral)   Resp 18   Ht 5' 7" (1.702 m)   Wt 97.1 kg   SpO2 98%   BMI 33.52 kg/m    Constitutional :  alert, cooperative, appears stated age and no distress  Lymphatics/Throat:  no asymmetry, masses, or scars  Respiratory:  clear to   auscultation bilaterally  Cardiovascular:  regular rate and rhythm  Gastrointestinal: soft, non-tender; bowel sounds normal; no masses,  no organomegaly.   Musculoskeletal:  Steady movement  Skin: Cool and moist  Psychiatric: Normal affect, non-agitated, not confused       LABS:  CMP Latest Ref Rng & Units 03/03/2020 08/18/2017 12/16/2016  Glucose 70 - 99 mg/dL 110(H) 115(H) 92  BUN 6 - 20 mg/dL _0 Creatinine 0.44 - 1.00 mg/dL 0.63 0.80 0.67  Sodium 135 - 145 mmol/L 135 138 135  Potassium 3.5 - 5.1 mmol/L 3.7 3.8 4.1  Chloride 98 - 111 mmol/L 103 106 104  CO2 22 - 32 mmol/L _1 Calcium 8.9 - 10.3 mg/dL 8.7(L) 9.5 9.3  Total Protein 6.5 - 8.1 g/dL 7.0 7.6 -  Total Bilirubin 0.3 - 1.2 mg/dL 0.8 0.7 -  Alkaline Phos 38 - 126 U/L 134(H) 85 -  AST 15 - 41 U/L 97(H) 31 -  ALT 0 - 44 U/L 76(H) 20 -   CBC Latest Ref Rng & Units 03/03/2020 08/18/2017 12/16/2016  WBC 4.0 - 10.5 K/uL 14.2(H) 6.4 7.2  Hemoglobin 12.0 - 15.0 g/dL 12.2 13.7 12.1  Hematocrit 36.0 - 46.0 % 36.9 40.7 35.7(L)  Platelets 150 - 400 K/uL 230 350 297     RADS: CLINICAL DATA:  Right flank pain for the past 2 weeks. The patient reports kidney the stones seen at [facility] 2 weeks ago. Previous hysterectomy and appendectomy.  EXAM: CT ABDOMEN AND PELVIS WITHOUT CONTRAST  TECHNIQUE: Multidetector CT imaging of the abdomen and pelvis was performed following the standard protocol without IV contrast.  COMPARISON:  Pelvic ultrasound dated 09/24/2014. Limited abdomen ultrasound dated 06/24/2013. Abdomen and pelvis CT report dated 07/16/2013. [facility] abdomen and CT report dated 02/18/2020.  FINDINGS: Lower chest: Minimal bilateral dependent atelectasis. Normal sized heart.  Hepatobiliary: Probable mild sludge in the gallbladder and possible tiny noncalcified gallstones in the gallbladder. No gallbladder wall thickening or pericholecystic fluid. Unremarkable liver.  Pancreas: No visible pancreatic tissue.  Spleen: Normal in size without focal abnormality.  Adrenals/Urinary Tract: 3 mm mid right renal calculus. Otherwise, normal appearing kidneys, ureters,  urinary bladder and adrenal glands.  Stomach/Bowel: Unremarkable stomach, small bowel and colon. Surgically absent appendix.  Vascular/Lymphatic: No significant vascular findings are present. No enlarged abdominal or pelvic lymph nodes.  Reproductive: Status post hysterectomy. No adnexal masses. 2.9 cm simple appearing left ovarian cyst/follicle.  Other: Very small umbilical hernia containing fat.  Musculoskeletal: Mild lumbar and lower thoracic spine degenerative changes.  IMPRESSION: 1. 3 mm nonobstructing mid right renal calculus. 2. Probable mild sludge in the gallbladder and possible tiny noncalcified gallstones in the gallbladder. 3. Complete pancreatic atrophy.   Electronically Signed   By: Claudie Revering M.D.   On: 03/03/2020 09:17  CLINICAL DATA:  Biliary colic. Additional history provided by scanning technologist: Patient reports right-sided pain for 2 weeks.  EXAM: ULTRASOUND ABDOMEN LIMITED RIGHT UPPER QUADRANT  COMPARISON:  CT abdomen/pelvis 01/02/2020, abdominal ultrasound 01/11/2012.  FINDINGS: Gallbladder:  Sludge and questionable tiny gallstones within the gallbladder. There is no appreciable gallbladder wall thickening. No sonographic Murphy sign noted by sonographer.  Common bile duct:  Diameter: 4 mm, within normal limits.  Liver:  Diffusely increased hepatic parenchymal echogenicity. There are several hypoechoic foci within the right hepatic lobe. Portal vein is patent on color Doppler imaging with normal direction of blood flow towards the liver.  IMPRESSION: Sludge and questionable tiny gallstones within  the gallbladder. No sonographic evidence of acute cholecystitis. The common duct is normal in caliber.  There are several hypoechoic foci within the right hepatic lobe. Primary differential considerations include liver masses versus areas of focal fatty sparing. Contrast-enhanced abdominal MRI is recommended for  further evaluation.  Generalized hyperechogenicity of the hepatic parenchyma. This is a nonspecific finding, which may be seen in the setting of hepatic steatosis or other chronic hepatic parenchymal disease.   Electronically Signed   By: Kellie Simmering DO   On: 03/03/2020 10:53  Assessment:      RUQ/flank pain, fever, night sweats Gallbladder sludge questionable liver mass with abnormal LFTs Nonobstructing nephrolithiasis History of pyelonephritis with E. coli urine culture  Plan:     Patient history and constellation of symptoms concerning for all of the above differential diagnosis.  Penicillin allergy limits use of certain antibiotics.  Will start IV Cipro and Flagyl for possible persistent pyelonephritis versus cholecystitis not seen on imaging while awaiting MRI results for these new questionable liver masses noted on the most recent ultrasound.    Will like to rule out any liver neoplasm prior to considering robotic lap assisted cholecystectomy to see if removing the gallbladder will relieve any of her symptoms.  Although the ultrasound did not show any signs of acute cholecystitis, the leukocytosis and abnormal LFTs including a slightly increased alk phos is concerning for possible gallbladder etiology as the cause of all this pain.    Pyelonephritis can also not be completely excluded due to the equivocal urinalysis today, also reported E. coli urine cultures recently.

## 2020-03-03 NOTE — Progress Notes (Signed)
Update:  MRI negative for any concerning lesions that were initially suspected on ultrasound.  MRI also did not show any evidence of acute cholecystitis.  Discussed with patient that MRI showing no signs of acute cholecystitis makes it even less likely there is any acute gallbladder pathology.  Patient verbalized understanding, but still wishes to proceed with lap chole to definitively ensure gallbladder as not the source of her persistent symptoms.  Patient will be added back onto the OR schedule and will proceed with lap chole as originally discussed.

## 2020-03-03 NOTE — Anesthesia Procedure Notes (Signed)
Procedure Name: Intubation Date/Time: 03/03/2020 5:10 PM Performed by: Jerrye Noble, CRNA Pre-anesthesia Checklist: Patient identified, Emergency Drugs available, Suction available and Patient being monitored Patient Re-evaluated:Patient Re-evaluated prior to induction Oxygen Delivery Method: Circle system utilized Preoxygenation: Pre-oxygenation with 100% oxygen Induction Type: IV induction and Rapid sequence Laryngoscope Size: McGraph and 3 Grade View: Grade I Tube type: Oral Tube size: 7.0 mm Number of attempts: 1 Airway Equipment and Method: Stylet and Oral airway Placement Confirmation: ETT inserted through vocal cords under direct vision,  positive ETCO2 and breath sounds checked- equal and bilateral Secured at: 22 cm Tube secured with: Tape Dental Injury: Teeth and Oropharynx as per pre-operative assessment

## 2020-03-03 NOTE — ED Triage Notes (Signed)
Pt c/o right flank pain for the past 2 weeks, dx with kidney stones 2 weeks ago when she went to [facility]. States she has been miserable since.

## 2020-03-03 NOTE — ED Notes (Signed)
Pt calling her mom to verify ride prior to pain medication administration. Pt states states she is able to come and pick patient up.

## 2020-03-03 NOTE — H&P (View-Only) (Signed)
Update:  MRI negative for any concerning lesions that were initially suspected on ultrasound.  MRI also did not show any evidence of acute cholecystitis.  Discussed with patient that MRI showing no signs of acute cholecystitis makes it even less likely there is any acute gallbladder pathology.  Patient verbalized understanding, but still wishes to proceed with lap chole to definitively ensure gallbladder as not the source of her persistent symptoms.  Patient will be added back onto the OR schedule and will proceed with lap chole as originally discussed.

## 2020-03-03 NOTE — Interval H&P Note (Signed)
History and Physical Interval Note:  03/03/2020 4:43 PM  [name]  has presented today for surgery, with the diagnosis of ACUTE CHOLECYSTITIS.  The various methods of treatment have been discussed with the patient and family. After consideration of risks, benefits and other options for treatment, the patient has consented to  Procedure(s): XI ROBOTIC [place] (N/A) as a surgical intervention.  The patient's history has been reviewed, patient examined, no change in status, stable for surgery.  I have reviewed the patient's chart and labs.  Questions were answered to the patient's satisfaction.     Praneeth Bussey [name]

## 2020-03-03 NOTE — Transfer of Care (Signed)
Immediate Anesthesia Transfer of Care Note  Patient: [name]  Procedure(s) Performed: XI ROBOTIC ASSISTED LAPAROSCOPIC CHOLECYSTECTOMY (N/A Abdomen) INDOCYANINE Calvin Chura FLUORESCENCE IMAGING (ICG) (N/A )  Patient Location: PACU  Anesthesia Type:General  Level of Consciousness: awake and alert   Airway & Oxygen Therapy: Patient Spontanous Breathing and Patient connected to face mask oxygen  Post-op Assessment: Report given to RN and Post -op Vital signs reviewed and stable  Post vital signs: Reviewed and stable  Last Vitals:  Vitals Value Taken Time  BP 108/50 03/03/20 1903  Temp 36.3 C 03/03/20 1903  Pulse 97 03/03/20 1907  Resp 17 03/03/20 1907  SpO2 95 % 03/03/20 1907  Vitals shown include unvalidated device data.  Last Pain:  Vitals:   03/03/20 1351  TempSrc:   PainSc: 7          Complications: No apparent anesthesia complications

## 2020-03-04 LAB — CBC
HCT: 34 % — ABNORMAL LOW (ref 36.0–46.0)
Hemoglobin: 11.3 g/dL — ABNORMAL LOW (ref 12.0–15.0)
MCH: 29 pg (ref 26.0–34.0)
MCHC: 33.2 g/dL (ref 30.0–36.0)
MCV: 87.4 fL (ref 80.0–100.0)
Platelets: 216 10*3/uL (ref 150–400)
RBC: 3.89 MIL/uL (ref 3.87–5.11)
RDW: 14.1 % (ref 11.5–15.5)
WBC: 8.7 10*3/uL (ref 4.0–10.5)
nRBC: 0 % (ref 0.0–0.2)

## 2020-03-04 LAB — HEPATIC FUNCTION PANEL
ALT: 75 U/L — ABNORMAL HIGH (ref 0–44)
AST: 102 U/L — ABNORMAL HIGH (ref 15–41)
Albumin: 3.2 g/dL — ABNORMAL LOW (ref 3.5–5.0)
Alkaline Phosphatase: 126 U/L (ref 38–126)
Bilirubin, Direct: 0.2 mg/dL (ref 0.0–0.2)
Indirect Bilirubin: 0.7 mg/dL (ref 0.3–0.9)
Total Bilirubin: 0.9 mg/dL (ref 0.3–1.2)
Total Protein: 6.8 g/dL (ref 6.5–8.1)

## 2020-03-04 LAB — BASIC METABOLIC PANEL
Anion gap: 8 (ref 5–15)
BUN: 11 mg/dL (ref 6–20)
CO2: 25 mmol/L (ref 22–32)
Calcium: 8.6 mg/dL — ABNORMAL LOW (ref 8.9–10.3)
Chloride: 105 mmol/L (ref 98–111)
Creatinine, Ser: 0.7 mg/dL (ref 0.44–1.00)
GFR calc Af Amer: >60 mL/min (ref >60)
GFR calc non Af Amer: >60 mL/min (ref >60)
Glucose, Bld: 130 mg/dL — ABNORMAL HIGH (ref 70–99)
Potassium: 4.3 mmol/L (ref 3.5–5.1)
Sodium: 138 mmol/L (ref 135–145)

## 2020-03-04 LAB — MAGNESIUM: Magnesium: 2.2 mg/dL (ref 1.7–2.4)

## 2020-03-04 LAB — PHOSPHORUS: Phosphorus: 4 mg/dL (ref 2.5–4.6)

## 2020-03-04 LAB — HIV ANTIBODY (ROUTINE TESTING W REFLEX): HIV Screen 4th Generation wRfx: NONREACTIVE

## 2020-03-04 MED ORDER — IBUPROFEN 800 MG PO TABS
800.0000 mg | ORAL_TABLET | Freq: Three times a day (TID) | ORAL | 0 refills | Status: DC | PRN
Start: 2020-03-04 — End: 2020-04-23

## 2020-03-04 MED ORDER — HYDROCODONE-ACETAMINOPHEN 5-325 MG PO TABS: 1.0000 | ORAL_TABLET | Freq: Four times a day (QID) | ORAL | 0 refills | Status: DC | PRN

## 2020-03-04 MED ORDER — ACETAMINOPHEN 325 MG PO TABS
650.0000 mg | ORAL_TABLET | Freq: Three times a day (TID) | ORAL | 0 refills | Status: AC | PRN
Start: 2020-03-04 — End: 2020-04-03

## 2020-03-04 MED ORDER — DOCUSATE SODIUM 100 MG PO CAPS
100.0000 mg | ORAL_CAPSULE | Freq: Two times a day (BID) | ORAL | 0 refills | Status: AC | PRN
Start: 2020-03-04 — End: 2020-03-14

## 2020-03-04 NOTE — Discharge Instructions (Signed)
Laparoscopic Cholecystectomy, Care After This sheet gives you information about how to care for yourself after your procedure. Your doctor may also give you more specific instructions. If you have problems or questions, contact your doctor. Follow these instructions at home: Care for cuts from surgery (incisions)   Follow instructions from your doctor about how to take care of your cuts from surgery. Make sure you: ? Wash your hands with soap and water before you change your bandage (dressing). If you cannot use soap and water, use hand sanitizer. ? Change your bandage as told by your doctor. ? Leave stitches (sutures), skin glue, or skin tape (adhesive) strips in place. They may need to stay in place for 2 weeks or longer. If tape strips get loose and curl up, you may trim the loose edges. Do not remove tape strips completely unless your doctor says it is okay.  Do not take baths, swim, or use a hot tub until your doctor says it is okay. OK TO SHOWER 24HRS AFTER YOUR SURGERY.   Check your surgical cut area every day for signs of infection. Check for: ? More redness, swelling, or pain. ? More fluid or blood. ? Warmth. ? Pus or a bad smell. Activity  Do not drive or use heavy machinery while taking prescription pain medicine.  Do not play contact sports until your doctor says it is okay.  Do not drive for 24 hours if you were given a medicine to help you relax (sedative).  Rest as needed. Do not return to work or school until your doctor says it is okay. General instructions .  tylenol and advil as needed for discomfort.  Please alternate between the two every four hours as needed for pain.   .  Use narcotics, if prescribed, only when tylenol and motrin is not enough to control pain. .  325-650mg every 8hrs to max of 3000mg/24hrs (including the 325mg in every norco dose) for the tylenol.   .  Advil up to 800mg per dose every 8hrs as needed for pain.    To prevent or treat constipation  while you are taking prescription pain medicine, your doctor may recommend that you: ? Drink enough fluid to keep your pee (urine) clear or pale yellow. ? Take over-the-counter or prescription medicines. ? Eat foods that are high in fiber, such as fresh fruits and vegetables, whole grains, and beans. ? Limit foods that are high in fat and processed sugars, such as fried and sweet foods. Contact a doctor if:  You develop a rash.  You have more redness, swelling, or pain around your surgical cuts.  You have more fluid or blood coming from your surgical cuts.  Your surgical cuts feel warm to the touch.  You have pus or a bad smell coming from your surgical cuts.  You have a fever.  One or more of your surgical cuts breaks open. Get help right away if:  You have trouble breathing.  You have chest pain.  You have pain that is getting worse in your shoulders.  You faint or feel dizzy when you stand.  You have very bad pain in your belly (abdomen).  You are sick to your stomach (nauseous) for more than one day.  You have throwing up (vomiting) that lasts for more than one day.  You have leg pain. This information is not intended to replace advice given to you by your health care provider. Make sure you discuss any questions you have with your   health care provider. Document Released: 07/12/2008 Document Revised: 04/23/2016 Document Reviewed: 03/21/2016 Elsevier Interactive Patient Education  2019 [employer]   

## 2020-03-04 NOTE — Progress Notes (Signed)
[name]  A and O x 4 VSS. Pt tolerating diet well. No complaints of pain or nausea. IV removed intact, prescriptions given. Pt voices understanding of discharge instructions with no further questions. Pt discharged via wheelchair with axillary.   Allergies as of 03/04/2020      Reactions   Bee Venom Shortness Of Breath, Swelling   Swelling at site    Fish Allergy Anaphylaxis, Shortness Of Breath   Latex Hives, Shortness Of Breath, Swelling, Rash   Penicillins Hives, Itching   Has patient had a PCN reaction causing immediate rash, facial/tongue/throat swelling, SOB or lightheadedness with hypotension: Yes Has patient had a PCN reaction causing severe rash involving mucus membranes or skin necrosis: Yes Has patient had a PCN reaction that required hospitalization No Has patient had a PCN reaction occurring within the last 10 years: No If all of the above answers are "NO", then may proceed with Cephalosporin use.      Medication List    TAKE these medications   acetaminophen 325 MG tablet Commonly known as: Tylenol Take 2 tablets (650 mg total) by mouth every 8 (eight) hours as needed for mild pain. What changed:   medication strength  how much to take  when to take this  reasons to take this   docusate sodium 100 MG capsule Commonly known as: Colace Take 1 capsule (100 mg total) by mouth 2 (two) times daily as needed for up to 10 days for mild constipation.   HYDROcodone-acetaminophen 5-325 MG tablet Commonly known as: Norco Take 1 tablet by mouth every 6 (six) hours as needed for up to 6 doses for moderate pain.   ibuprofen 800 MG tablet Commonly known as: ADVIL Take 1 tablet (800 mg total) by mouth every 8 (eight) hours as needed for mild pain or moderate pain. What changed:   medication strength  how much to take  when to take this  reasons to take this       Vitals:   03/04/20 0221 03/04/20 0549  BP: 90/60 100/69  Pulse: 73 70  Resp: 16 16   Temp: (!) 97.4 F (36.3 C) 97.7 F (36.5 C)  SpO2: 95% 96%    [name]

## 2020-03-04 NOTE — Discharge Summary (Signed)
Physician Discharge Summary  Patient ID: [name] MRN: [id number] DOB/AGE: [birth year] 39 y.o.  Admit date: 03/03/2020 Discharge date: 03/04/2020  Admission Diagnoses: Upper quadrant pain  Discharge Diagnoses:  Acute cholecystitis  Discharged Condition: good  Hospital Course: Admitted for symptoms above.  Work-up noted possible biliary colic, abnormal LFTs, history of pyelonephritis, possible liver mass.  MRI obtained after initial work-up due to the questionable liver masses noted on ultrasound.  MRI was negative.  After extensive discussion with patient, opted to proceed with robotic lap chole.  Uneventful, please see op note for details.  She was noted to have acute cholecystitis grossly Intra-Op.  Postop, patient recovered well as expected.  At time of discharge, patient stated the initial symptoms have resolved and is now replaced with typical postoperative pain, well controlled on oral meds.  She is also tolerating a diet and voiding as well.   Consults: None  Discharge Exam: Blood pressure 100/69, pulse 70, temperature 97.7 F (36.5 C), temperature source Oral, resp. rate 16, height 5\' 7"  (1.702 m), weight 97.1 kg, SpO2 96 %. General appearance: alert, cooperative and no distress GI: Soft, no guarding, appropriate tenderness along right upper quadrant and incision sites, which are clean dry and intact.  Disposition:  Discharge disposition: 01-Home or Self Care       Discharge Instructions    Discharge patient   Complete by: As directed    Discharge disposition: 01-Home or Self Care   Discharge patient date: 03/04/2020     Allergies as of 03/04/2020      Reactions   Bee Venom Shortness Of Breath, Swelling   Swelling at site    Fish Allergy Anaphylaxis, Shortness Of Breath   Latex Hives, Shortness Of Breath, Swelling, Rash   Penicillins Hives, Itching   Has patient had a PCN reaction causing immediate rash, facial/tongue/throat swelling, SOB or  lightheadedness with hypotension: Yes Has patient had a PCN reaction causing severe rash involving mucus membranes or skin necrosis: Yes Has patient had a PCN reaction that required hospitalization No Has patient had a PCN reaction occurring within the last 10 years: No If all of the above answers are "NO", then may proceed with Cephalosporin use.      Medication List    TAKE these medications   acetaminophen 325 MG tablet Commonly known as: Tylenol Take 2 tablets (650 mg total) by mouth every 8 (eight) hours as needed for mild pain. What changed:   medication strength  how much to take  when to take this  reasons to take this   docusate sodium 100 MG capsule Commonly known as: Colace Take 1 capsule (100 mg total) by mouth 2 (two) times daily as needed for up to 10 days for mild constipation.   HYDROcodone-acetaminophen 5-325 MG tablet Commonly known as: Norco Take 1 tablet by mouth every 6 (six) hours as needed for up to 6 doses for moderate pain.   ibuprofen 800 MG tablet Commonly known as: ADVIL Take 1 tablet (800 mg total) by mouth every 8 (eight) hours as needed for mild pain or moderate pain. What changed:   medication strength  how much to take  when to take this  reasons to take this      Follow-up Information    Sylis Ketchum, DO. Go on 03/18/2020.   Specialty: Surgery Why: 9:30am appointment Contact information: [address] [id number] [phone]            Total time spent arranging discharge  was >48min. Signed: Benjamine Sprague 03/04/2020, 2:09 PM

## 2020-03-05 LAB — SURGICAL PATHOLOGY

## 2020-03-18 ENCOUNTER — Other Ambulatory Visit: Payer: Self-pay | Admitting: Surgery

## 2020-03-18 DIAGNOSIS — N63 Unspecified lump in unspecified breast: Secondary | ICD-10-CM

## 2020-03-19 ENCOUNTER — Other Ambulatory Visit: Payer: Medicaid Other

## 2020-03-20 DIAGNOSIS — K8689 Other specified diseases of pancreas: Secondary | ICD-10-CM | POA: Insufficient documentation

## 2020-03-26 ENCOUNTER — Ambulatory Visit
Admission: RE | Admit: 2020-03-26 | Discharge: 2020-03-26 | Disposition: A | Discharge status: Home / Self Care | Payer: Medicaid Other | Source: Ambulatory Visit | Attending: Surgery | Admitting: Surgery

## 2020-03-26 DIAGNOSIS — N63 Unspecified lump in unspecified breast: Secondary | ICD-10-CM

## 2020-03-26 DIAGNOSIS — N6315 Unspecified lump in the right breast, overlapping quadrants: Secondary | ICD-10-CM | POA: Insufficient documentation

## 2020-03-27 ENCOUNTER — Other Ambulatory Visit: Payer: Self-pay | Admitting: Surgery

## 2020-03-27 DIAGNOSIS — R928 Other abnormal and inconclusive findings on diagnostic imaging of breast: Secondary | ICD-10-CM

## 2020-03-27 DIAGNOSIS — N6310 Unspecified lump in the right breast, unspecified quadrant: Secondary | ICD-10-CM

## 2020-03-31 ENCOUNTER — Ambulatory Visit
Admission: RE | Admit: 2020-03-31 | Discharge: 2020-03-31 | Disposition: A | Discharge status: Home / Self Care | Payer: Managed Care, Other (non HMO) | Source: Ambulatory Visit | Attending: Surgery | Admitting: Surgery

## 2020-03-31 DIAGNOSIS — R928 Other abnormal and inconclusive findings on diagnostic imaging of breast: Secondary | ICD-10-CM

## 2020-03-31 DIAGNOSIS — N6310 Unspecified lump in the right breast, unspecified quadrant: Secondary | ICD-10-CM | POA: Diagnosis present

## 2020-04-01 DIAGNOSIS — C50919 Malignant neoplasm of unspecified site of unspecified female breast: Secondary | ICD-10-CM

## 2020-04-02 HISTORY — PX: BREAST BIOPSY: SHX20

## 2020-04-02 NOTE — Progress Notes (Signed)
Navigation initiated. Consult appointments scheduled with Dr. Janese Banks, and Dr. [name].

## 2020-04-03 ENCOUNTER — Inpatient Hospital Stay: Payer: Managed Care, Other (non HMO)

## 2020-04-03 ENCOUNTER — Other Ambulatory Visit: Payer: Self-pay

## 2020-04-03 ENCOUNTER — Inpatient Hospital Stay: Payer: Managed Care, Other (non HMO) | Attending: Oncology | Admitting: Oncology

## 2020-04-03 ENCOUNTER — Encounter: Payer: Self-pay | Admitting: Oncology

## 2020-04-03 VITALS — BP 139/90 | HR 98 | Resp 20 | Wt 209.3 lb

## 2020-04-03 DIAGNOSIS — F329 Major depressive disorder, single episode, unspecified: Secondary | ICD-10-CM | POA: Diagnosis not present

## 2020-04-03 DIAGNOSIS — F419 Anxiety disorder, unspecified: Secondary | ICD-10-CM | POA: Insufficient documentation

## 2020-04-03 DIAGNOSIS — C50411 Malignant neoplasm of upper-outer quadrant of right female breast: Secondary | ICD-10-CM | POA: Diagnosis not present

## 2020-04-03 DIAGNOSIS — Z79899 Other long term (current) drug therapy: Secondary | ICD-10-CM

## 2020-04-03 DIAGNOSIS — C50811 Malignant neoplasm of overlapping sites of right female breast: Secondary | ICD-10-CM | POA: Insufficient documentation

## 2020-04-03 DIAGNOSIS — Z7189 Other specified counseling: Secondary | ICD-10-CM

## 2020-04-03 NOTE — Progress Notes (Signed)
Hematology/Oncology Consult note [facility] [phone] [phone]  Patient Care Team: Tracie Harrier, MD as PCP - General (Internal Medicine) Theodore Demark, RN as Oncology Nurse Navigator   Name of the patient: [name]  [id number]  Jan 04, 1981    Reason for referral-new diagnosis of breast cancer   Referring physician-Dr. [name]  Date of visit: 04/03/20   History of presenting illness- Patient is a 39 year old premenopausal female who felt a palpable lump in her right breast which has been present for about a year.  She had undergone an MRI abdomen May 2021 which showed early inferior right breast foci of hyperenhancement.  This was followed by a bilateral diagnostic mammogram which showed a 3.7 x 1.7 x 2 cm mass in the 9 o'clock position of the right breast 6 cm from the nipple.  No evidence of right axillary lymph adenopathy.  No evidence of left breast malignancy.  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  ER/PR and HER-2 is currently pending.   Currently patient feels well and denies any complaints at this time.  Menarche at the age of 39.  She is G2, P2 L2.  She has a 60 year old daughter and 28 year old son.  Age at first birth 39 years.  She has used birth control in the past.  She has undergone hysterectomy but still has her left ovary in place.  She has a history of left breast cyst in 2017 but no biopsies.  Family history significant for breast cancer in maternal aunt at the age of 59, colon cancer in maternal uncle, colon and liver cancer in maternal grand father and maternal grandmother with non-Hodgkin's lymphoma  ECOG PS- 0  Pain scale- 0   Review of systems- Review of Systems  Constitutional: Negative for chills, fever, malaise/fatigue and weight loss.  HENT: Negative for congestion, ear discharge and nosebleeds.   Eyes: Negative for blurred vision.   Respiratory: Negative for cough, hemoptysis, sputum production, shortness of breath and wheezing.   Cardiovascular: Negative for chest pain, palpitations, orthopnea and claudication.  Gastrointestinal: Negative for abdominal pain, blood in stool, constipation, diarrhea, heartburn, melena, nausea and vomiting.  Genitourinary: Negative for dysuria, flank pain, frequency, hematuria and urgency.  Musculoskeletal: Negative for back pain, joint pain and myalgias.  Skin: Negative for rash.  Neurological: Negative for dizziness, tingling, focal weakness, seizures, weakness and headaches.  Endo/Heme/Allergies: Does not bruise/bleed easily.  Psychiatric/Behavioral: Negative for depression and suicidal ideas. The patient does not have insomnia.     Allergies  Allergen Reactions  . Bee Venom Shortness Of Breath and Swelling    Swelling at site   . Fish Allergy Anaphylaxis and Shortness Of Breath  . Latex Hives, Shortness Of Breath, Swelling and Rash  . Penicillins Hives and Itching    Has patient had a PCN reaction causing immediate rash, facial/tongue/throat swelling, SOB or lightheadedness with hypotension: Yes Has patient had a PCN reaction causing severe rash involving mucus membranes or skin necrosis: Yes Has patient had a PCN reaction that required hospitalization No Has patient had a PCN reaction occurring within the last 10 years: No If all of the above answers are "NO", then may proceed with Cephalosporin use.     Patient Active Problem List   Diagnosis Date Noted  . RUQ pain 03/03/2020     Past Medical History:  Diagnosis Date  . Anxiety   . Asthma   . Chronic back  pain   . Depression      Past Surgical History:  Procedure Laterality Date  . ABDOMINAL HYSTERECTOMY    . APPENDECTOMY    . CESAREAN SECTION    . DILATION AND CURETTAGE OF UTERUS      Social History   Socioeconomic History  . Marital status: Divorced    Spouse name: Not on file  . Number of children:  Not on file  . Years of education: Not on file  . Highest education level: Not on file  Occupational History  . Not on file  Tobacco Use  . Smoking status: Never Smoker  . Smokeless tobacco: Never Used  Vaping Use  . Vaping Use: Never used  Substance and Sexual Activity  . Alcohol use: Yes    Comment: rarely  . Drug use: No  . Sexual activity: Not on file  Other Topics Concern  . Not on file  Social History Narrative  . Not on file   Social Determinants of Health   Financial Resource Strain:   . Difficulty of Paying Living Expenses:   Food Insecurity:   . Worried About Charity fundraiser in the Last Year:   . Arboriculturist in the Last Year:   Transportation Needs:   . Film/video editor (Medical):   Marland Kitchen Lack of Transportation (Non-Medical):   Physical Activity:   . Days of Exercise per Week:   . Minutes of Exercise per Session:   Stress:   . Feeling of Stress :   Social Connections:   . Frequency of Communication with Friends and Family:   . Frequency of Social Gatherings with Friends and Family:   . Attends Religious Services:   . Active Member of Clubs or Organizations:   . Attends Archivist Meetings:   Marland Kitchen Marital Status:   Intimate Partner Violence:   . Fear of Current or Ex-Partner:   . Emotionally Abused:   Marland Kitchen Physically Abused:   . Sexually Abused:      Family History  Problem Relation Age of Onset  . Irritable bowel syndrome Mother   . Anxiety disorder Mother   . CAD Father   . Breast cancer Maternal Aunt 42     Current Outpatient Medications:  .  acetaminophen (TYLENOL) 325 MG tablet, Take 2 tablets (650 mg total) by mouth every 8 (eight) hours as needed for mild pain., Disp: 40 tablet, Rfl: 0 .  ALPRAZolam (XANAX) 0.25 MG tablet, Take 0.25 mg by mouth in the morning and at bedtime., Disp: , Rfl:  .  HYDROcodone-acetaminophen (NORCO) 5-325 MG tablet, Take 1 tablet by mouth every 6 (six) hours as needed for up to 6 doses for moderate  pain., Disp: 6 tablet, Rfl: 0 .  ibuprofen (ADVIL) 800 MG tablet, Take 1 tablet (800 mg total) by mouth every 8 (eight) hours as needed for mild pain or moderate pain., Disp: 30 tablet, Rfl: 0   Physical exam: There were no vitals filed for this visit. Physical Exam Constitutional:      General: She is not in acute distress. Cardiovascular:     Rate and Rhythm: Normal rate and regular rhythm.     Heart sounds: Normal heart sounds.  Pulmonary:     Effort: Pulmonary effort is normal.     Breath sounds: Normal breath sounds.  Abdominal:     General: Bowel sounds are normal.     Palpations: Abdomen is soft.  Skin:    General: Skin is  warm and dry.  Neurological:     Mental Status: She is alert and oriented to person, place, and time.   Breast exam: Palpable roughly 3 cm mass in the posterior aspect of the right breast at 10 o'clock position.  No palpable masses in the left breast.  No palpable bilateral axillary adenopathy.    CMP Latest Ref Rng & Units 03/04/2020  Glucose 70 - 99 mg/dL 130(H)  BUN 6 - 20 mg/dL 11  Creatinine 0.44 - 1.00 mg/dL 0.70  Sodium 135 - 145 mmol/L 138  Potassium 3.5 - 5.1 mmol/L 4.3  Chloride 98 - 111 mmol/L 105  CO2 22 - 32 mmol/L 25  Calcium 8.9 - 10.3 mg/dL 8.6(L)  Total Protein 6.5 - 8.1 g/dL 6.8  Total Bilirubin 0.3 - 1.2 mg/dL 0.9  Alkaline Phos 38 - 126 U/L 126  AST 15 - 41 U/L 102(H)  ALT 0 - 44 U/L 75(H)   CBC Latest Ref Rng & Units 03/04/2020  WBC 4.0 - 10.5 K/uL 8.7  Hemoglobin 12.0 - 15.0 g/dL 11.3(L)  Hematocrit 36 - 46 % 34.0(L)  Platelets 150 - 400 K/uL 216    No images are attached to the encounter.  US BREAST LTD UNI RIGHT INC AXILLA  Result Date: 03/26/2020 CLINICAL DATA:  39 year old female presenting for evaluation of a palpable lump in the right breast which she has noticed for about a year. She had an abdominal MRI in May of 2021 showing some enhancement in the right breast. She has family history of breast cancer in a  maternal aunt diagnosed at about age 61. EXAM: DIGITAL DIAGNOSTIC BILATERAL MAMMOGRAM WITH CAD AND TOMO ULTRASOUND RIGHT BREAST COMPARISON:  Previous exam(s). ACR Breast Density Category c: The breast tissue is heterogeneously dense, which may obscure small masses. FINDINGS: Spot compression tomosynthesis images over the palpable site of concern in the lateral posterior right breast demonstrates a broad area of distortion with probable underlying associated masses. The area spans approximately 3.8 cm mammographically. No other suspicious calcifications, masses or areas of distortion are seen in the bilateral breasts. Mammographic images were processed with CAD. On physical exam, there is an easily palpable firm protruding mass in the lateral aspect of the right breast. Ultrasound targeted to the right breast at 9 o'clock, 6 cm from the nipple demonstrates a heterogeneous ill-defined irregular shadowing mass measuring 3.7 x 1.7 x 2.0 cm. Ultrasound of the right axilla demonstrates multiple normal-appearing lymph nodes. IMPRESSION: 1. There is a highly suspicious mass in the lateral right breast measuring approximately 3.7 cm. 2.  No evidence of right axillary lymphadenopathy. 3.  No evidence of left breast malignancy. RECOMMENDATION: 1. Ultrasound-guided biopsy is recommended for the right breast mass at 9 o'clock. 2. Presuming that the results demonstrate malignancy, consider breast MRI to determine extent of disease given the density of the patient's breast tissue, age and ill-defined appearance of the mass. I have discussed the findings and recommendations with the patient. If applicable, a reminder letter will be sent to the patient regarding the next appointment. BI-RADS CATEGORY  5: Highly suggestive of malignancy. Electronically Signed   By: Ammie Ferrier M.D.   On: 03/26/2020 16:14   MM DIAG BREAST TOMO BILATERAL  Result Date: 03/26/2020 CLINICAL DATA:  39 year old female presenting for evaluation of  a palpable lump in the right breast which she has noticed for about a year. She had an abdominal MRI in May of 2021 showing some enhancement in the right breast. She has family  history of breast cancer in a maternal aunt diagnosed at about age 11. EXAM: DIGITAL DIAGNOSTIC BILATERAL MAMMOGRAM WITH CAD AND TOMO ULTRASOUND RIGHT BREAST COMPARISON:  Previous exam(s). ACR Breast Density Category c: The breast tissue is heterogeneously dense, which may obscure small masses. FINDINGS: Spot compression tomosynthesis images over the palpable site of concern in the lateral posterior right breast demonstrates a broad area of distortion with probable underlying associated masses. The area spans approximately 3.8 cm mammographically. No other suspicious calcifications, masses or areas of distortion are seen in the bilateral breasts. Mammographic images were processed with CAD. On physical exam, there is an easily palpable firm protruding mass in the lateral aspect of the right breast. Ultrasound targeted to the right breast at 9 o'clock, 6 cm from the nipple demonstrates a heterogeneous ill-defined irregular shadowing mass measuring 3.7 x 1.7 x 2.0 cm. Ultrasound of the right axilla demonstrates multiple normal-appearing lymph nodes. IMPRESSION: 1. There is a highly suspicious mass in the lateral right breast measuring approximately 3.7 cm. 2.  No evidence of right axillary lymphadenopathy. 3.  No evidence of left breast malignancy. RECOMMENDATION: 1. Ultrasound-guided biopsy is recommended for the right breast mass at 9 o'clock. 2. Presuming that the results demonstrate malignancy, consider breast MRI to determine extent of disease given the density of the patient's breast tissue, age and ill-defined appearance of the mass. I have discussed the findings and recommendations with the patient. If applicable, a reminder letter will be sent to the patient regarding the next appointment. BI-RADS CATEGORY  5: Highly suggestive of  malignancy. Electronically Signed   By: Ammie Ferrier M.D.   On: 03/26/2020 16:14   MM CLIP PLACEMENT RIGHT  Result Date: 03/31/2020 CLINICAL DATA:  Post ultrasound-guided biopsy of an ill-defined mass in the right breast at the 9 o'clock position. EXAM: DIAGNOSTIC RIGHT MAMMOGRAM POST ULTRASOUND BIOPSY COMPARISON:  Previous exams. FINDINGS: Mammographic images were obtained following ultrasound guided biopsy of an ill-defined mass in the right breast at the 9 o'clock position. The ribbon shaped biopsy marking clip is present at the site of the biopsied mass in the right breast at the 9 o'clock position. IMPRESSION: Ribbon shaped biopsy marking clip at site of biopsied mass in the right breast at the 9 o'clock position. Final Assessment: Post Procedure Mammograms for Marker Placement Electronically Signed   By: Everlean Alstrom M.D.   On: 03/31/2020 08:47   [place] RT BREAST BX W LOC DEV 1ST LESION IMG BX SPEC US GUIDE  Addendum Date: 04/02/2020   ADDENDUM REPORT: 04/02/2020 13:10 ADDENDUM: PATHOLOGY revealed: A. BREAST, RIGHT AT 9:00, 6 CM FROM THE NIPPLE; ULTRASOUND-GUIDED CORE NEEDLE BIOPSY: - INVASIVE MAMMARY CARCINOMA, NO SPECIAL TYPE. At least 11 mm in this sample. Grade 2. Ductal carcinoma in situ: Not identified. Lymphovascular invasion: Not identified. Final grading will be assessed on the final resection. Pathology results are CONCORDANT with imaging findings, per Dr. Everlean Alstrom. Pathology results and recommendations below were discussed with patient by telephone on 04/01/2020. Patient reported biopsy site doing well with slight tenderness at the site. Post biopsy care instructions were reviewed and questions were answered. Patient was instructed to call [facility] if any concerns or questions arise related to the biopsy. Recommendation: 1. Surgical referral. Request for surgical referral was relayed to Shirley and Tanya Nones RN at [facility] by Electa Sniff RN on 6/16/ 2021. 2. Recommend bilateral breast MRI given patient's age, dense breast tissue and ill defined appearance of  mass. Addendum by Electa Sniff RN on 04/02/2020. Electronically Signed   By: Everlean Alstrom M.D.   On: 04/02/2020 13:10   Result Date: 04/02/2020 CLINICAL DATA:  39 year old female with a suspicious palpable mass in the right breast at the approximate 9 o'clock position. EXAM: ULTRASOUND GUIDED RIGHT BREAST CORE NEEDLE BIOPSY COMPARISON:  Previous exam(s). PROCEDURE: I met with the patient and we discussed the procedure of ultrasound-guided biopsy, including benefits and alternatives. We discussed the high likelihood of a successful procedure. We discussed the risks of the procedure, including infection, bleeding, tissue injury, clip migration, and inadequate sampling. Informed written consent was given. The usual time-out protocol was performed immediately prior to the procedure. Lesion quadrant: Upper-outer Using sterile technique and 1% Lidocaine as local anesthetic, under direct ultrasound visualization, a 14 gauge spring-loaded device was used to perform biopsy of the heterogeneous ill-defined mass in the right breast at the 9 o'clock position using a lateral to medial approach. At the conclusion of the procedure wing shaped tissue marker clip was deployed into the biopsy cavity. Follow up 2 view mammogram was performed and dictated separately. IMPRESSION: Ultrasound guided biopsy of the mass in the right breast at the 9 o'clock position. No apparent complications. Electronically Signed: By: Everlean Alstrom M.D. On: 03/31/2020 08:46    Assessment and plan- Patient is a 39 y.o. female most with newly diagnosed right breast cancer with a 3.7 cm breast mass.  ER/PR and HER-2 status is currently pending  I discussed the results of mammogram with the patient in detail which shows a 3.7 cm right breast mass with normal-appearing lymph nodes.  Mass has been biopsied and is consistent  with invasive mammary carcinoma.  ER/PR and HER-2 is currently pending.  At this time I would recommend getting MRI of the bilateral breasts with and without contrast to a certain if there are any other areas of enhancement and to a certain to extent of the disease.  I laid out the following possibilities for the patient:  1.  If breast MRI does not show any additional features for malignancy more than what is already known on the mammogram and patient has ER/PR positive HER-2 negative disease-I would recommend upfront lumpectomy with sentinel lymph node biopsy without neoadjuvant chemotherapy.  I will reach out to Dr. [name] to see if he thinks neoadjuvant chemotherapy would be warranted from a surgical standpoint if breast conservation is desired  2.  If patient is found to have ER/PR negative or HER-2 positive disease I would recommend neoadjuvant chemotherapy.  In these situations given that the size of the breast mass is 3.7 cm-the hope is that neoadjuvant chemotherapy can achieve a pathological complete response which determines long-term prognosis and can potentially alter adjuvant treatment  3.  If patient is found to have abnormal lymph nodes on MRI-this would need to be biopsied and she would benefit from neoadjuvant chemotherapy even if she has ER/PR positive and HER-2 negative disease.  I will see the patient after MRI and ER/PR and HER-2 receptor status is back to discuss further management.  Treatment will be given with the potential curative intent  Given her young age of diagnosis of breast cancer I will refer her to genetic counseling as well.  Treatment will be given with a curative intent   Thank you for this kind referral and the opportunity to participate in the care of this patient   Visit Diagnosis 1. Malignant neoplasm of upper-outer quadrant of right female breast, unspecified estrogen  receptor status ([place])   2. Goals of care, counseling/discussion     Dr. Randa Evens,  MD, MPH [facility] [phone] 04/07/2020 12:51 PM

## 2020-04-03 NOTE — Progress Notes (Signed)
Patient states she eats once a day. Nausea. Patient states in the last two days she has had 5-6 hours.

## 2020-04-06 ENCOUNTER — Telehealth: Payer: Self-pay | Admitting: *Deleted

## 2020-04-06 NOTE — Telephone Encounter (Signed)
VM TO PT TO SCHEDULE BREAST MRI

## 2020-04-07 ENCOUNTER — Encounter: Payer: Self-pay | Admitting: Oncology

## 2020-04-07 ENCOUNTER — Inpatient Hospital Stay (HOSPITAL_BASED_OUTPATIENT_CLINIC_OR_DEPARTMENT_OTHER): Payer: Managed Care, Other (non HMO) | Admitting: Licensed Clinical Social Worker

## 2020-04-07 ENCOUNTER — Other Ambulatory Visit: Payer: Self-pay

## 2020-04-07 ENCOUNTER — Inpatient Hospital Stay: Payer: Managed Care, Other (non HMO)

## 2020-04-07 ENCOUNTER — Encounter: Payer: Self-pay | Admitting: Licensed Clinical Social Worker

## 2020-04-07 DIAGNOSIS — Z8052 Family history of malignant neoplasm of bladder: Secondary | ICD-10-CM | POA: Diagnosis not present

## 2020-04-07 DIAGNOSIS — C50411 Malignant neoplasm of upper-outer quadrant of right female breast: Secondary | ICD-10-CM

## 2020-04-07 DIAGNOSIS — Z803 Family history of malignant neoplasm of breast: Secondary | ICD-10-CM | POA: Diagnosis not present

## 2020-04-07 DIAGNOSIS — Z800 Family history of malignant neoplasm of digestive organs: Secondary | ICD-10-CM | POA: Diagnosis not present

## 2020-04-07 NOTE — Progress Notes (Signed)
REFERRING PROVIDER: Sindy Guadeloupe, MD [place],  [place] [address]  PRIMARY PROVIDER:  Tracie Harrier, MD  PRIMARY REASON FOR VISIT:  1. Malignant neoplasm of upper-outer quadrant of right female breast, unspecified estrogen receptor status ([place])   2. Family history of breast cancer   3. Family history of colon cancer   4. Family history of bladder cancer      HISTORY OF PRESENT ILLNESS:   Ms. Cerniglia, a 39 y.o. female, was seen for a [facility] cancer genetics consultation at the request of Dr. Janese Banks due to a personal and family history of cancer.  Ms. Josten presents to clinic today to discuss the possibility of a hereditary predisposition to cancer, genetic testing, and to further clarify her future cancer risks, as well as potential cancer risks for family members.   In 2021, at the age of 4, Ms. Mcilrath was diagnosed with invasive mammary carcinoma of the right breast, ER/PR/Her2 pending. The treatment plan is still being determined, she does have a breast MRI and surgical consult scheduled soon.   CANCER HISTORY:  Oncology History   No history exists.     RISK FACTORS:  Menarche was at age 46.  First live birth at age 38.  OCP use: yes Ovaries intact: left ovary intact.  Hysterectomy: yes.  Menopausal status: premenopausal.  Colonoscopy: yes; normal. Mammogram within the last year: yes. Number of breast biopsies: 2. Up to date with pelvic exams: no. Any excessive radiation exposure in the past: no  Past Medical History:  Diagnosis Date  . Anxiety   . Asthma   . Chronic back pain   . Depression   . Family history of bladder cancer   . Family history of breast cancer   . Family history of colon cancer     Past Surgical History:  Procedure Laterality Date  . ABDOMINAL HYSTERECTOMY    . APPENDECTOMY    . CESAREAN SECTION    . DILATION AND CURETTAGE OF UTERUS      Social History   Socioeconomic History  . Marital status: Divorced     Spouse name: Not on file  . Number of children: Not on file  . Years of education: Not on file  . Highest education level: Not on file  Occupational History  . Not on file  Tobacco Use  . Smoking status: Never Smoker  . Smokeless tobacco: Never Used  Vaping Use  . Vaping Use: Never used  Substance and Sexual Activity  . Alcohol use: Yes    Comment: rarely  . Drug use: No  . Sexual activity: Not on file  Other Topics Concern  . Not on file  Social History Narrative  . Not on file   Social Determinants of Health   Financial Resource Strain:   . Difficulty of Paying Living Expenses:   Food Insecurity:   . Worried About Charity fundraiser in the Last Year:   . Arboriculturist in the Last Year:   Transportation Needs:   . Film/video editor (Medical):   Marland Kitchen Lack of Transportation (Non-Medical):   Physical Activity:   . Days of Exercise per Week:   . Minutes of Exercise per Session:   Stress:   . Feeling of Stress :   Social Connections:   . Frequency of Communication with Friends and Family:   . Frequency of Social Gatherings with Friends and Family:   . Attends Religious Services:   . Active  Member of Clubs or Organizations:   . Attends Archivist Meetings:   Marland Kitchen Marital Status:      FAMILY HISTORY:  We obtained a detailed, 4-generation family history.  Significant diagnoses are listed below: Family History  Problem Relation Age of Onset  . Irritable bowel syndrome Mother   . Anxiety disorder Mother   . CAD Father   . Breast cancer Maternal Aunt 27  . Colon cancer Maternal Uncle        dx 32s  . Bladder Cancer Paternal Aunt   . Non-Hodgkin's lymphoma Maternal Grandmother 77  . Colon cancer Maternal Grandfather        dx 50s-60s  . Breast cancer Maternal Great-grandmother     Ms. Holbein has a son, 20, and a daughter, 14. She has 1 sister, 20, with no history of cancer. She has 2 nephews and 1 niece.  Ms. Borntreger mother is living at 61, no  history of cancer. Patient has 1 maternal uncle and 1 aunt. Her uncle had colon cancer in his 32s and died in his early 23s. Her aunt had breast cancer at 75 and is living at 59. No known cancers in maternal cousins. Maternal grandmother was recently diagnosed with Non-Hodgkin's Lymphoma at 38. Grandfather had colon cancer in his 39s-60s, liver cancer and died at 72. His mother had breast cancer as well and had bilateral mastectomies.   Ms. Snook father died at 63 due to heart issues. Patient had 3 paternal aunts. One aunt had bladder cancer in her 34s. No other known cancers on this side of the family, no information about paternal grandparents.   Ms. Lites is unaware of previous family history of genetic testing for hereditary cancer risks. Patient's maternal ancestors are of unknown/English descent, and paternal ancestors are of unknown/English descent. There is no reported Ashkenazi Jewish ancestry. There is no known consanguinity.  GENETIC COUNSELING ASSESSMENT: Ms. Treiber is a 39 y.o. female with a personal and family history of breast cancer which is somewhat suggestive of a hereditary cancer syndrome and predisposition to cancer. We, therefore, discussed and recommended the following at today's visit.   DISCUSSION: We discussed that 5 - 10% of breast cancer is hereditary, with most cases associated with BRCA1/BRCA2 mutations.  There are other genes that can be associated with hereditary breast cancer syndromes.  These include PALB2, CHEK2, ATM. There are also genes associated with hereditary colon cancer syndromes.  We discussed that testing is beneficial for several reasons including surgical decision-making for breast cancer, knowing how to follow individuals after completing their treatment, and understand if other family members could be at risk for cancer and allow them to undergo genetic testing.   We reviewed the characteristics, features and inheritance patterns of hereditary  cancer syndromes. We also discussed genetic testing, including the appropriate family members to test, the process of testing, insurance coverage and turn-around-time for results. We discussed the implications of a negative, positive and/or variant of uncertain significant result. In order to get genetic test results in a timely manner so that Ms. Bruhn can use these genetic test results for surgical decisions, we recommended Ms. Amenta pursue genetic testing for the [facility] Breast Cancer STAT Panel. Once complete, we recommend Ms. Valladares pursue reflex genetic testing to the Common Hereditary Cancers gene panel.   The STAT Breast cancer panel offered by [facility] includes sequencing and rearrangement analysis for the following 9 genes:  ATM, BRCA1, BRCA2, CDH1, CHEK2, PALB2, PTEN, STK11 and TP53.  The Common Hereditary Cancers Panel offered by [facility] includes sequencing and/or deletion duplication testing of the following 48 genes: APC, ATM, AXIN2, BARD1, BMPR1A, BRCA1, BRCA2, BRIP1, CDH1, CDKN2A (p14ARF), CDKN2A (p16INK4a), CKD4, CHEK2, CTNNA1, DICER1, EPCAM (Deletion/duplication testing only), GREM1 (promoter region deletion/duplication testing only), KIT, MEN1, MLH1, MSH2, MSH3, MSH6, MUTYH, NBN, NF1, NHTL1, PALB2, PDGFRA, PMS2, POLD1, POLE, PTEN, RAD50, RAD51C, RAD51D, RNF43, SDHB, SDHC, SDHD, SMAD4, SMARCA4. STK11, TP53, TSC1, TSC2, and VHL.  The following genes were evaluated for sequence changes only: SDHA and HOXB13 c.251G>A variant only.  Based on Ms. Crisostomo personal and family history of cancer, she meets medical criteria for genetic testing. Despite that she meets criteria, she may still have an out of pocket cost.   PLAN: After considering the risks, benefits, and limitations, Ms. Cerny provided informed consent to pursue genetic testing and the blood sample was sent to [facility] for analysis of the STAT Breast Cancer Panel + Common Hereditary Cancers Panel. Results  should be available within approximately 1 weeks' time, at which point they will be disclosed by telephone to Ms. Hackworth, as will any additional recommendations warranted by these results. Ms. Honda will receive a summary of her genetic counseling visit and a copy of her results once available. This information will also be available in Epic.   Ms. Wareing questions were answered to her satisfaction today. Our contact information was provided should additional questions or concerns arise. Thank you for the referral and allowing [place] to share in the care of your patient.   Faith Rogue, MS, [facility] Genetic Counselor [email or web address] Phone: [phone]  The patient was seen for a total of 40 minutes in face-to-face genetic counseling.  Drs. Magrinat, Lindi Adie and/or Burr Medico were available for discussion regarding this case.   _______________________________________________________________________ For Office Staff:  Number of people involved in session: 1 Was an Intern/ student involved with case: no

## 2020-04-07 NOTE — Progress Notes (Signed)
Patient is scheduled for MRI 04/09/20.  She will see Retail buyer today.  Rescheduled surgical consult with Dr. [name] for 04/15/20 at 9:00.

## 2020-04-09 ENCOUNTER — Other Ambulatory Visit: Payer: Self-pay

## 2020-04-09 ENCOUNTER — Ambulatory Visit
Admission: RE | Admit: 2020-04-09 | Discharge: 2020-04-09 | Disposition: A | Discharge status: Home / Self Care | Payer: Managed Care, Other (non HMO) | Source: Ambulatory Visit | Attending: Oncology | Admitting: Oncology

## 2020-04-09 DIAGNOSIS — C50411 Malignant neoplasm of upper-outer quadrant of right female breast: Secondary | ICD-10-CM | POA: Diagnosis present

## 2020-04-09 MED ORDER — GADOBUTROL 1 MMOL/ML IV SOLN
9.0000 mL | Freq: Once | INTRAVENOUS | Status: AC | PRN
  Administered 2020-04-09: 9 mL via INTRAVENOUS

## 2020-04-14 ENCOUNTER — Telehealth: Payer: Self-pay | Admitting: Licensed Clinical Social Worker

## 2020-04-14 ENCOUNTER — Ambulatory Visit: Payer: Self-pay | Admitting: Licensed Clinical Social Worker

## 2020-04-14 ENCOUNTER — Encounter: Payer: Self-pay | Admitting: Licensed Clinical Social Worker

## 2020-04-14 DIAGNOSIS — Z1379 Encounter for other screening for genetic and chromosomal anomalies: Secondary | ICD-10-CM | POA: Insufficient documentation

## 2020-04-14 DIAGNOSIS — Z803 Family history of malignant neoplasm of breast: Secondary | ICD-10-CM

## 2020-04-14 DIAGNOSIS — C50411 Malignant neoplasm of upper-outer quadrant of right female breast: Secondary | ICD-10-CM

## 2020-04-14 DIAGNOSIS — Z8052 Family history of malignant neoplasm of bladder: Secondary | ICD-10-CM

## 2020-04-14 DIAGNOSIS — Z800 Family history of malignant neoplasm of digestive organs: Secondary | ICD-10-CM

## 2020-04-14 NOTE — Progress Notes (Signed)
HPI:  Ms. Damman was previously seen in the [place] clinic due to a personal and family history of cancer and concerns regarding a hereditary predisposition to cancer. Please refer to our prior cancer genetics clinic note for more information regarding our discussion, assessment and recommendations, at the time. Ms. Molnar recent genetic test results were disclosed to her, as were recommendations warranted by these results. These results and recommendations are discussed in more detail below.  CANCER HISTORY:  Oncology History   No history exists.    FAMILY HISTORY:  We obtained a detailed, 4-generation family history.  Significant diagnoses are listed below: Family History  Problem Relation Age of Onset  . Irritable bowel syndrome Mother   . Anxiety disorder Mother   . CAD Father   . Breast cancer Maternal Aunt 27  . Colon cancer Maternal Uncle        dx 48s  . Bladder Cancer Paternal Aunt   . Non-Hodgkin's lymphoma Maternal Grandmother 77  . Colon cancer Maternal Grandfather        dx 50s-60s  . Breast cancer Maternal Great-grandmother    Ms. Bluestone has a son, 66, and a daughter, 53. She has 1 sister, 59, with no history of cancer. She has 2 nephews and 1 niece.  Ms. Vandervoort mother is living at 11, no history of cancer. Patient has 1 maternal uncle and 1 aunt. Her uncle had colon cancer in his 15s and died in his early 41s. Her aunt had breast cancer at 92 and is living at 64. No known cancers in maternal cousins. Maternal grandmother was recently diagnosed with Non-Hodgkin's Lymphoma at 84. Grandfather had colon cancer in his 40s-60s, liver cancer and died at 61. His mother had breast cancer as well and had bilateral mastectomies.   Ms. Crume father died at 83 due to heart issues. Patient had 3 paternal aunts. One aunt had bladder cancer in her 54s. No other known cancers on this side of the family, no information about paternal grandparents.    Ms. Notaro is unaware of previous family history of genetic testing for hereditary cancer risks. Patient's maternal ancestors are of unknown/English descent, and paternal ancestors are of unknown/English descent. There is no reported Ashkenazi Jewish ancestry. There is no known consanguinity.   GENETIC TEST RESULTS: Genetic testing reported out on 04/14/2020 through the [facility] Breast Cancer STAT Panel + Common Hereditary cancer panel found no pathogenic mutations.   The STAT Breast cancer panel offered by [facility] includes sequencing and rearrangement analysis for the following 9 genes:  ATM, BRCA1, BRCA2, CDH1, CHEK2, PALB2, PTEN, STK11 and TP53.    The Common Hereditary Cancers Panel offered by [facility] includes sequencing and/or deletion duplication testing of the following 48 genes: APC, ATM, AXIN2, BARD1, BMPR1A, BRCA1, BRCA2, BRIP1, CDH1, CDKN2A (p14ARF), CDKN2A (p16INK4a), CKD4, CHEK2, CTNNA1, DICER1, EPCAM (Deletion/duplication testing only), GREM1 (promoter region deletion/duplication testing only), KIT, MEN1, MLH1, MSH2, MSH3, MSH6, MUTYH, NBN, NF1, NHTL1, PALB2, PDGFRA, PMS2, POLD1, POLE, PTEN, RAD50, RAD51C, RAD51D, RNF43, SDHB, SDHC, SDHD, SMAD4, SMARCA4. STK11, TP53, TSC1, TSC2, and VHL.  The following genes were evaluated for sequence changes only: SDHA and HOXB13 c.251G>A variant only..   The test report has been scanned into EPIC and is located under the Molecular Pathology section of the Results Review tab.  A portion of the result report is included below for reference.     We discussed with Ms. Holdman that because current genetic testing is not perfect, it is  possible there may be a gene mutation in one of these genes that current testing cannot detect, but that chance is small.  We also discussed, that there could be another gene that has not yet been discovered, or that we have not yet tested, that is responsible for the cancer diagnoses in the family. It is also possible  there is a hereditary cause for the cancer in the family that Ms. Creedon did not inherit and therefore was not identified in her testing.  Therefore, it is important to remain in touch with cancer genetics in the future so that we can continue to offer Ms. Sison the most up to date genetic testing.    ADDITIONAL GENETIC TESTING: We discussed with Ms. Logie that her genetic testing was fairly extensive.  If there are genes identified to increase cancer risk that can be analyzed in the future, we would be happy to discuss and coordinate this testing at that time.    CANCER SCREENING RECOMMENDATIONS: Ms. Galdamez test result is considered negative (normal).  This means that we have not identified a hereditary cause for her  personal and family history of cancer at this time.   While reassuring, this does not definitively rule out a hereditary predisposition to cancer. It is still possible that there could be genetic mutations that are undetectable by current technology. There could be genetic mutations in genes that have not been tested or identified to increase cancer risk.  Therefore, it is recommended she continue to follow the cancer management and screening guidelines provided by her oncology and primary healthcare provider.   An individual's cancer risk and medical management are not determined by genetic test results alone. Overall cancer risk assessment incorporates additional factors, including personal medical history, family history, and any available genetic information that may result in a personalized plan for cancer prevention and surveillance.  RECOMMENDATIONS FOR FAMILY MEMBERS:  Relatives in this family might be at some increased risk of developing cancer, over the general population risk, simply due to the family history of cancer.  We recommended female relatives in this family have a yearly mammogram beginning at age 22, or 68 years younger than the earliest onset of cancer, an  annual clinical breast exam, and perform monthly breast self-exams. Female relatives in this family should also have a gynecological exam as recommended by their primary provider.  All family members should be referred for colonoscopy starting at age 92.   It is also possible there is a hereditary cause for the cancer in Ms. Paszkiewicz's family that she did not inherit and therefore was not identified in her.  Based on Ms. Scheurich family history, we recommended her maternal aunt who had breast cancer in her 29s  have genetic counseling and testing. Ms. Coppess will let us know if we can be of any assistance in coordinating genetic counseling and/or testing for these family members.  FOLLOW-UP: Lastly, we discussed with Ms. Reining that cancer genetics is a rapidly advancing field and it is possible that new genetic tests will be appropriate for her and/or her family members in the future. We encouraged her to remain in contact with cancer genetics on an annual basis so we can update her personal and family histories and let her know of advances in cancer genetics that may benefit this family.   Our contact number was provided. Ms. Betters questions were answered to her satisfaction, and she knows she is welcome to call us at anytime with additional questions or  concerns.   Faith Rogue, MS, [facility] Genetic Counselor [email or web address] Phone: [phone]

## 2020-04-14 NOTE — Telephone Encounter (Signed)
Revealed negative genetic testing.   We discussed that we do not know why she has breast cancer or why there is cancer in the family. It could be due to a different gene that we are not testing, or something our current technology cannot pick up.  It will be important for her to keep in contact with genetics to learn if additional testing may be needed in the future.  

## 2020-04-16 DIAGNOSIS — C50919 Malignant neoplasm of unspecified site of unspecified female breast: Secondary | ICD-10-CM

## 2020-04-16 DIAGNOSIS — K219 Gastro-esophageal reflux disease without esophagitis: Secondary | ICD-10-CM

## 2020-04-16 HISTORY — DX: Gastro-esophageal reflux disease without esophagitis: K21.9

## 2020-04-16 HISTORY — DX: Malignant neoplasm of unspecified site of unspecified female breast: C50.919

## 2020-04-17 ENCOUNTER — Encounter: Payer: Self-pay | Admitting: Oncology

## 2020-04-17 LAB — SURGICAL PATHOLOGY

## 2020-04-21 ENCOUNTER — Encounter: Payer: Self-pay | Admitting: Diagnostic Radiology

## 2020-04-22 ENCOUNTER — Inpatient Hospital Stay: Payer: Managed Care, Other (non HMO) | Attending: Oncology | Admitting: Oncology

## 2020-04-22 ENCOUNTER — Ambulatory Visit: Payer: Self-pay | Admitting: Surgery

## 2020-04-22 DIAGNOSIS — J45909 Unspecified asthma, uncomplicated: Secondary | ICD-10-CM | POA: Insufficient documentation

## 2020-04-22 DIAGNOSIS — Z7189 Other specified counseling: Secondary | ICD-10-CM

## 2020-04-22 DIAGNOSIS — Z8052 Family history of malignant neoplasm of bladder: Secondary | ICD-10-CM | POA: Insufficient documentation

## 2020-04-22 DIAGNOSIS — Z803 Family history of malignant neoplasm of breast: Secondary | ICD-10-CM | POA: Insufficient documentation

## 2020-04-22 DIAGNOSIS — Z79899 Other long term (current) drug therapy: Secondary | ICD-10-CM | POA: Insufficient documentation

## 2020-04-22 DIAGNOSIS — Z9221 Personal history of antineoplastic chemotherapy: Secondary | ICD-10-CM | POA: Insufficient documentation

## 2020-04-22 DIAGNOSIS — R630 Anorexia: Secondary | ICD-10-CM

## 2020-04-22 DIAGNOSIS — Z170 Estrogen receptor positive status [ER+]: Secondary | ICD-10-CM | POA: Diagnosis not present

## 2020-04-22 DIAGNOSIS — Z8249 Family history of ischemic heart disease and other diseases of the circulatory system: Secondary | ICD-10-CM | POA: Insufficient documentation

## 2020-04-22 DIAGNOSIS — Z923 Personal history of irradiation: Secondary | ICD-10-CM | POA: Insufficient documentation

## 2020-04-22 DIAGNOSIS — Z800 Family history of malignant neoplasm of digestive organs: Secondary | ICD-10-CM | POA: Insufficient documentation

## 2020-04-22 DIAGNOSIS — F418 Other specified anxiety disorders: Secondary | ICD-10-CM | POA: Diagnosis not present

## 2020-04-22 DIAGNOSIS — C50411 Malignant neoplasm of upper-outer quadrant of right female breast: Secondary | ICD-10-CM | POA: Diagnosis not present

## 2020-04-22 DIAGNOSIS — F419 Anxiety disorder, unspecified: Secondary | ICD-10-CM | POA: Insufficient documentation

## 2020-04-22 DIAGNOSIS — Z791 Long term (current) use of non-steroidal anti-inflammatories (NSAID): Secondary | ICD-10-CM | POA: Insufficient documentation

## 2020-04-22 DIAGNOSIS — F329 Major depressive disorder, single episode, unspecified: Secondary | ICD-10-CM | POA: Insufficient documentation

## 2020-04-22 NOTE — H&P (View-Only) (Signed)
Subjective:   CC: Malignant neoplasm of lower-outer quadrant of right breast of female, estrogen receptor positive (CMS-HCC) [C50.511, Z17.0] HPI:  [name] is a 39 y.o. female who returns for evaluation of above. Subsequent imaging studies and biopsy for the incidental breast mass noted during lap chole workup returned as mammary CA.  Pending HER-2.  No specific issues or complaints except for anxiety over recent diagnosis.  BRCA gene negative.  Past Medical History:  has a past medical history of Anxiety, Depression, Menstrual disorder, unspecified, and Seizure disorder (CMS-HCC).  Past Surgical History:  has a past surgical history that includes Cesarean section; Dilation and curettage, diagnostic / therapeutic; Appendectomy; and Cholecystectomy (03/03/2020).  Family History: family history includes Anxiety in her mother; Breast cancer in an other family member; Colon cancer in an other family member; Depression in her mother; High blood pressure (Hypertension) in her father and another family member; Irregular Heart Beat (Arrhythmia) in her mother.  Social History:  reports that she has never smoked. She has never used smokeless tobacco. She reports current alcohol use. She reports that she does not use drugs.  Current Medications: has a current medication list which includes the following prescription(s): alprazolam.  Allergies:       Allergies as of 04/15/2020 - Reviewed 04/15/2020  Allergen Reaction Noted  . 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 Latex Unknown 07/03/2014  . Penicillin Unknown 07/03/2014  . Shellfish containing products Hives 06/23/2015    ROS:  A 15 point review of systems was performed and was negative except as noted in HPI   Objective:   BP (!) 134/94   Pulse 95   Ht 170.2 cm (5' 7")   Wt 95.3 kg (210 lb)   BMI 32.89 kg/m   Constitutional :  alert, appears stated age, cooperative and no distress  Lymphatics/Throat:  no  asymmetry, masses, or scars  Respiratory:  clear to auscultation bilaterally  Cardiovascular:  regular rate and rhythm  Gastrointestinal: soft, non-tender; bowel sounds normal; no masses,  no organomegaly.   Musculoskeletal: Steady gait and movement  Skin: Cool and moist  Psychiatric: Normal affect, non-agitated, not confused  Breast:  Chaperone present for exam.  left breast normal without mass, skin or nipple changes or axillary nodes, right breast with palpalbe mass of concern in outer lower quadrant, more prominent in sitting position.  No overlying skin involvement, no TTP.    LABS:  SURGICAL PATHOLOGY  THIS IS AN ADDENDUM REPORT   CASE: [id number]  PATIENT: [facility]  Surgical Pathology Report  Addendum   Reason for Addendum #1: Breast Biomarker Results  Reason for Addendum #2: Immunohistochemistry results   Specimen Submitted:  A. Breast, right   Clinical History: Highly suspicious for malignancy. Ribbon shaped clip  placed following ultrasound guided biopsy of RIGHT breast at 9 o'clock.     DIAGNOSIS:  A. BREAST, RIGHT AT 9:00, 6 CM FROM THE NIPPLE; ULTRASOUND-GUIDED CORE  NEEDLE BIOPSY:  - INVASIVE MAMMARY CARCINOMA, NO SPECIAL TYPE.   Size of invasive carcinoma: At least 11 mm in this sample  Histologic grade of invasive carcinoma: Grade 2            Glandular/tubular differentiation score: 3            Nuclear pleomorphism score: 2            Mitotic rate score: 2            Total score: 7  Ductal carcinoma in situ: Not  identified  Lymphovascular invasion: Not identified   Final grading will be assessed on the final resection.   ER/PR/HER2: Immunohistochemistry will be performed on block A2, with  reflex to [place] for HER2 2+. The results will be reported in an addendum.   GROSS DESCRIPTION:  A. Labeled: Right breast 9:00 6 cm from nipple  Received: Formalin  Time/date in fixative: Collected and placed  in formalin at 8:20 AM on  03/31/2020  Cold ischemic time: Less than 1 minute  Total fixation time: 9 hours  Core pieces: 4  Size: Ranging from 1-1.7 cm in length and 0.2 cm in diameter  Description: Received are cores of tan-yellow fibrofatty adipose tissue.  Ink color: Blue  Entirely submitted in cassettes 1-2.     Final Diagnosis performed by Allena Napoleon, MD.  Electronically signed  04/01/2020 12:24:03PM  The electronic signature indicates that the named Attending Pathologist  has evaluated the specimen  Technical component performed at [facility], [address], [place],  [place] [address] Lab: [phone] Dir: Rush Farmer, MD, MMM  Professional component performed at [facility], [place], [place], [place] [address] Lab: [phone]  Dir: Dellia Nims. Rubinas, MD   ADDENDUM:  BREAST BIOMARKER TESTS  Estrogen Receptor (ER) Status: POSITIVE            Percentage of cells with nuclear positivity: >90%            Average intensity of staining: Strong   Progesterone Receptor (PgR) Status: POSITIVE            Percentage of cells with nuclear positivity: 11-50%            Average intensity of staining: Strong   HER2 (by immunohistochemistry): EQUIVOCAL (Score 2+)            Percentage of cells with uniform intense complete  membrane staining: <10%   HER-2 testing by fluorescence in situ hybridization will be performed,  and reported as an addendum.   Cold Ischemia and Fixation Times: Meet requirements specified in latest  version of the ASCO/CAP guidelines  Testing Performed on Block Number(s): A2   METHODS  Fixative: Formalin  Estrogen Receptor: FDA cleared ([place]) Primary Antibody:SP1  Progesterone Receptor: FDA cleared ([place]) Primary Antibody: 1E2  HER2 (by IHC): FDA cleared ([place]) Primary Antibody: 4B5 (PATHWAY)  Immunohistochemistry controls worked appropriately. Slides  were prepared  by [facility], [place], [place], and interpreted by Allena Napoleon,  MD.   This test was developed and its performance characteristics determined  by [facility]. It has not been cleared or approved by the [place] Food and Drug  Administration. The FDA does not require this test to go through  premarket FDA review. This test is used for clinical purposes. It should  not be regarded as investigational or for research. This laboratory is  certified under the Clinical Laboratory Improvement Amendments (CLIA) as  qualified to perform high complexity clinical laboratory testing.          Addendum #1 performed by Allena Napoleon, MD.  Electronically signed  04/07/2020 4:38:09PM  The electronic signature indicates that the named Attending Pathologist  has evaluated the specimen  Technical component performed at [facility], [address] [place],  [place] [address] Lab: [phone] Dir: Rush Farmer, MD, MMM  Professional component performed at [facility], [facility], [place], [place], [place] [address] Lab: [phone]  Dir: Dellia Nims. Rubinas, MD   ADDENDUM:  An immunohistochemical study directed against Ki-67 was performed, and  shows an elevated mitotic index, with approximately 20-30% of neoplastic  cells showing positivity.   IHC slides were prepared by [facility], [facility], [place]. All controls stained appropriately.   This test was developed and its performance characteristics determined  by [facility]. It has not been cleared or approved by the [place] Food and Drug  Administration. The FDA does not require this test to go through  premarket FDA review. This test is used for clinical purposes. It should  not be regarded as investigational or for research. This laboratory is  certified under the Clinical Laboratory Improvement Amendments (CLIA) as  qualified to perform high complexity clinical laboratory testing.    Addendum #2 performed by Allena Napoleon, MD.  Electronically signed  04/14/2020 12:20:51PM  The electronic signature indicates that the named Attending Pathologist  has evaluated the specimen  Technical component performed at [facility], [address] [place],  [place] [address] Lab: [phone] Dir: Rush Farmer, MD, MMM  Professional component performed at [facility], [facility], [place], [place], [place] [address] Lab: [phone]  Dir: Dellia Nims. Rubinas, MD   RADS: CLINICAL DATA: Recently diagnosed right breast carcinoma. Patient  felt a lump for 1 year. Assess for extent of disease.   LABS: No labs drawn at time of imaging.   EXAM:  BILATERAL BREAST MRI WITH AND WITHOUT CONTRAST   TECHNIQUE:  Multiplanar, multisequence MR images of both breasts were obtained  prior to and following the intravenous administration of 9 ml of  Gadavist   Three-dimensional MR images were rendered by post-processing of the  original MR data on an independent workstation. The  three-dimensional MR images were interpreted, and findings are  reported in the following complete MRI report for this study. Three  dimensional images were evaluated at the independent DynaCad  workstation   COMPARISON: Previous mammograms and breast ultrasounds.   FINDINGS:  Breast composition: c. Heterogeneous fibroglandular tissue.   Background parenchymal enhancement: Mild.   Right breast: There is an irregular enhancing mass in the right  breast lower outer quadrant. Mass shows rapid wash-in and washout  kinetics. The mass is associated architectural distortion and a  focus of susceptibility artifact reflecting the post biopsy marker  clip. The mass measures 4.0 x 2.0 x 2.6 cm in size.   There are no other suspicious right breast masses and no other areas  of abnormal enhancement. Right breast cysts are incidentally noted.   Left breast: No suspicious mass or abnormal  enhancement. Left breast  cysts are incidentally noted.   Lymph nodes: No abnormal appearing lymph nodes.   Ancillary findings: None.   IMPRESSION:  1. 4 cm irregular enhancing mass with associated architectural  distortion in the lower outer quadrant of the right breast, close to  9 o'clock, reflecting the recently biopsied right breast carcinoma,  and corresponding in size to the ultrasound appearance of the mass.  2. No evidence of additional malignancy in the right breast.  3. No evidence of left breast malignancy.   RECOMMENDATION:  Treatment as planned for the known right breast carcinoma.   BI-RADS CATEGORY 6: Known biopsy-proven malignancy.    Electronically Signed  By: Lajean Manes M.D.  On: 04/09/2020 15:57   Assessment:   Malignant neoplasm of lower-outer quadrant of right breast of female, estrogen receptor positive (CMS-HCC) [C50.511, Z17.0]  Plan:   1. Malignant neoplasm of lower-outer quadrant of right breast of female, estrogen receptor positive (CMS-HCC) [C50.511, Z17.0] Discussed  the risk of surgery including recurrence, chronic pain, post-op infxn, poor/delayed wound healing, poor cosmesis, seroma, hematoma formation, and possible re-operation to address said risks. The risks of general anesthetic, if used, includes MI, CVA, sudden death or even reaction to anesthetic medications also discussed.  Typical post-op recovery time and possbility of activity restrictions were also discussed.  Alternatives include continued observation.  Benefits include possible symptom relief, pathologic evaluation, and/or curative excision.   The patient verbalized understanding and all questions were answered to the patient's satisfaction. All questions addressed at this time.

## 2020-04-22 NOTE — Progress Notes (Signed)
Patient is depresses cries herself to sleep and  Not eating normally. States she does not have a provider  To manage her depression since she has gotten her cancer diagnosis.SJC

## 2020-04-22 NOTE — H&P (Signed)
Subjective:   CC: Malignant neoplasm of lower-outer quadrant of right breast of female, estrogen receptor positive (CMS-HCC) [C50.511, Z17.0] HPI:  [name] is a 39 y.o. female who returns for evaluation of above. Subsequent imaging studies and biopsy for the incidental breast mass noted during lap chole workup returned as mammary CA.  Pending HER-2.  No specific issues or complaints except for anxiety over recent diagnosis.  BRCA gene negative.  Past Medical History:  has a past medical history of Anxiety, Depression, Menstrual disorder, unspecified, and Seizure disorder (CMS-HCC).  Past Surgical History:  has a past surgical history that includes Cesarean section; Dilation and curettage, diagnostic / therapeutic; Appendectomy; and Cholecystectomy (03/03/2020).  Family History: family history includes Anxiety in her mother; Breast cancer in an other family member; Colon cancer in an other family member; Depression in her mother; High blood pressure (Hypertension) in her father and another family member; Irregular Heart Beat (Arrhythmia) in her mother.  Social History:  reports that she has never smoked. She has never used smokeless tobacco. She reports current alcohol use. She reports that she does not use drugs.  Current Medications: has a current medication list which includes the following prescription(s): alprazolam.  Allergies:       Allergies as of 04/15/2020 - Reviewed 04/15/2020  Allergen Reaction Noted  . 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 Latex Unknown 07/03/2014  . Penicillin Unknown 07/03/2014  . Shellfish containing products Hives 06/23/2015    ROS:  A 15 point review of systems was performed and was negative except as noted in HPI   Objective:   BP (!) 134/94   Pulse 95   Ht 170.2 cm (5' 7")   Wt 95.3 kg (210 lb)   BMI 32.89 kg/m   Constitutional :  alert, appears stated age, cooperative and no distress  Lymphatics/Throat:  no  asymmetry, masses, or scars  Respiratory:  clear to auscultation bilaterally  Cardiovascular:  regular rate and rhythm  Gastrointestinal: soft, non-tender; bowel sounds normal; no masses,  no organomegaly.   Musculoskeletal: Steady gait and movement  Skin: Cool and moist  Psychiatric: Normal affect, non-agitated, not confused  Breast:  Chaperone present for exam.  left breast normal without mass, skin or nipple changes or axillary nodes, right breast with palpalbe mass of concern in outer lower quadrant, more prominent in sitting position.  No overlying skin involvement, no TTP.    LABS:  SURGICAL PATHOLOGY  THIS IS AN ADDENDUM REPORT   CASE: [id number]  PATIENT: [name]  Surgical Pathology Report  Addendum   Reason for Addendum #1: Breast Biomarker Results  Reason for Addendum #2: Immunohistochemistry results   Specimen Submitted:  A. Breast, right   Clinical History: Highly suspicious for malignancy. Ribbon shaped clip  placed following ultrasound guided biopsy of RIGHT breast at 9 o'clock.     DIAGNOSIS:  A. BREAST, RIGHT AT 9:00, 6 CM FROM THE NIPPLE; ULTRASOUND-GUIDED CORE  NEEDLE BIOPSY:  - INVASIVE MAMMARY CARCINOMA, NO SPECIAL TYPE.   Size of invasive carcinoma: At least 11 mm in this sample  Histologic grade of invasive carcinoma: Grade 2            Glandular/tubular differentiation score: 3            Nuclear pleomorphism score: 2            Mitotic rate score: 2            Total score: 7  Ductal carcinoma in situ: Not   identified  Lymphovascular invasion: Not identified   Final grading will be assessed on the final resection.   ER/PR/HER2: Immunohistochemistry will be performed on block A2, with  reflex to [place] for HER2 2+. The results will be reported in an addendum.   GROSS DESCRIPTION:  A. Labeled: Right breast 9:00 6 cm from nipple  Received: Formalin  Time/date in fixative: Collected and placed  in formalin at 8:20 AM on  03/31/2020  Cold ischemic time: Less than 1 minute  Total fixation time: 9 hours  Core pieces: 4  Size: Ranging from 1-1.7 cm in length and 0.2 cm in diameter  Description: Received are cores of tan-yellow fibrofatty adipose tissue.  Ink color: Blue  Entirely submitted in cassettes 1-2.     Final Diagnosis performed by Allena Napoleon, MD.  Electronically signed  04/01/2020 12:24:03PM  The electronic signature indicates that the named Attending Pathologist  has evaluated the specimen  Technical component performed at [facility], [address] [place],  [place] [address] Lab: [phone] Dir: Rush Farmer, MD, MMM  Professional component performed at [facility], [place], [place], [place] [address] Lab: [phone]  Dir: Dellia Nims. Rubinas, MD   ADDENDUM:  BREAST BIOMARKER TESTS  Estrogen Receptor (ER) Status: POSITIVE            Percentage of cells with nuclear positivity: >90%            Average intensity of staining: Strong   Progesterone Receptor (PgR) Status: POSITIVE            Percentage of cells with nuclear positivity: 11-50%            Average intensity of staining: Strong   HER2 (by immunohistochemistry): EQUIVOCAL (Score 2+)            Percentage of cells with uniform intense complete  membrane staining: <10%   HER-2 testing by fluorescence in situ hybridization will be performed,  and reported as an addendum.   Cold Ischemia and Fixation Times: Meet requirements specified in latest  version of the ASCO/CAP guidelines  Testing Performed on Block Number(s): A2   METHODS  Fixative: Formalin  Estrogen Receptor: FDA cleared ([place]) Primary Antibody:SP1  Progesterone Receptor: FDA cleared ([place]) Primary Antibody: 1E2  HER2 (by IHC): FDA cleared ([place]) Primary Antibody: 4B5 (PATHWAY)  Immunohistochemistry controls worked appropriately. Slides  were prepared  by [facility], [place], [place], and interpreted by Allena Napoleon,  MD.   This test was developed and its performance characteristics determined  by [facility]. It has not been cleared or approved by the [place] Food and Drug  Administration. The FDA does not require this test to go through  premarket FDA review. This test is used for clinical purposes. It should  not be regarded as investigational or for research. This laboratory is  certified under the Clinical Laboratory Improvement Amendments (CLIA) as  qualified to perform high complexity clinical laboratory testing.          Addendum #1 performed by Allena Napoleon, MD.  Electronically signed  04/07/2020 4:38:09PM  The electronic signature indicates that the named Attending Pathologist  has evaluated the specimen  Technical component performed at [facility], [address] [place],  [place] [address] Lab: [phone] Dir: Rush Farmer, MD, MMM  Professional component performed at [facility], [facility], [place], [place], [place] [address] Lab: [phone]  Dir: Dellia Nims. Rubinas, MD   ADDENDUM:  An immunohistochemical study directed against Ki-67 was performed, and  shows an elevated mitotic index, with approximately 20-30% of neoplastic  cells showing positivity.   IHC slides were prepared by [facility], [facility], [place]. All controls stained appropriately.   This test was developed and its performance characteristics determined  by [facility]. It has not been cleared or approved by the [place] Food and Drug  Administration. The FDA does not require this test to go through  premarket FDA review. This test is used for clinical purposes. It should  not be regarded as investigational or for research. This laboratory is  certified under the Clinical Laboratory Improvement Amendments (CLIA) as  qualified to perform high complexity clinical laboratory testing.    Addendum #2 performed by Heath Jones, MD.  Electronically signed  04/14/2020 12:20:51PM  The electronic signature indicates that the named Attending Pathologist  has evaluated the specimen  Technical component performed at [facility], [address], [place],  [place] [address] Lab: [phone] Dir: Sanjai Nagendra, MD, MMM  Professional component performed at [facility], [facility], [address], [place], [place] [address] Lab: [phone]  Dir: Tara C. Rubinas, MD   RADS: CLINICAL DATA: Recently diagnosed right breast carcinoma. Patient  felt a lump for 1 year. Assess for extent of disease.   LABS: No labs drawn at time of imaging.   EXAM:  BILATERAL BREAST MRI WITH AND WITHOUT CONTRAST   TECHNIQUE:  Multiplanar, multisequence MR images of both breasts were obtained  prior to and following the intravenous administration of 9 ml of  Gadavist   Three-dimensional MR images were rendered by post-processing of the  original MR data on an independent workstation. The  three-dimensional MR images were interpreted, and findings are  reported in the following complete MRI report for this study. Three  dimensional images were evaluated at the independent DynaCad  workstation   COMPARISON: Previous mammograms and breast ultrasounds.   FINDINGS:  Breast composition: c. Heterogeneous fibroglandular tissue.   Background parenchymal enhancement: Mild.   Right breast: There is an irregular enhancing mass in the right  breast lower outer quadrant. Mass shows rapid wash-in and washout  kinetics. The mass is associated architectural distortion and a  focus of susceptibility artifact reflecting the post biopsy marker  clip. The mass measures 4.0 x 2.0 x 2.6 cm in size.   There are no other suspicious right breast masses and no other areas  of abnormal enhancement. Right breast cysts are incidentally noted.   Left breast: No suspicious mass or abnormal  enhancement. Left breast  cysts are incidentally noted.   Lymph nodes: No abnormal appearing lymph nodes.   Ancillary findings: None.   IMPRESSION:  1. 4 cm irregular enhancing mass with associated architectural  distortion in the lower outer quadrant of the right breast, close to  9 o'clock, reflecting the recently biopsied right breast carcinoma,  and corresponding in size to the ultrasound appearance of the mass.  2. No evidence of additional malignancy in the right breast.  3. No evidence of left breast malignancy.   RECOMMENDATION:  Treatment as planned for the known right breast carcinoma.   BI-RADS CATEGORY 6: Known biopsy-proven malignancy.    Electronically Signed  By: David Ormond M.D.  On: 04/09/2020 15:57   Assessment:   Malignant neoplasm of lower-outer quadrant of right breast of female, estrogen receptor positive (CMS-HCC) [C50.511, Z17.0]  Plan:   1. Malignant neoplasm of lower-outer quadrant of right breast of female, estrogen receptor positive (CMS-HCC) [C50.511, Z17.0] Discussed   the risk of surgery including recurrence, chronic pain, post-op infxn, poor/delayed wound healing, poor cosmesis, seroma, hematoma formation, and possible re-operation to address said risks. The risks of general anesthetic, if used, includes MI, CVA, sudden death or even reaction to anesthetic medications also discussed.  Typical post-op recovery time and possbility of activity restrictions were also discussed.  Alternatives include continued observation.  Benefits include possible symptom relief, pathologic evaluation, and/or curative excision.   The patient verbalized understanding and all questions were answered to the patient's satisfaction. All questions addressed at this time.

## 2020-04-23 ENCOUNTER — Encounter: Payer: Self-pay | Admitting: Oncology

## 2020-04-23 ENCOUNTER — Other Ambulatory Visit: Payer: Self-pay | Admitting: Surgery

## 2020-04-23 ENCOUNTER — Inpatient Hospital Stay (HOSPITAL_BASED_OUTPATIENT_CLINIC_OR_DEPARTMENT_OTHER): Payer: Managed Care, Other (non HMO) | Admitting: Hospice and Palliative Medicine

## 2020-04-23 DIAGNOSIS — R110 Nausea: Secondary | ICD-10-CM

## 2020-04-23 DIAGNOSIS — C50511 Malignant neoplasm of lower-outer quadrant of right female breast: Secondary | ICD-10-CM

## 2020-04-23 DIAGNOSIS — F329 Major depressive disorder, single episode, unspecified: Secondary | ICD-10-CM | POA: Diagnosis not present

## 2020-04-23 DIAGNOSIS — F419 Anxiety disorder, unspecified: Secondary | ICD-10-CM

## 2020-04-23 DIAGNOSIS — G4700 Insomnia, unspecified: Secondary | ICD-10-CM

## 2020-04-23 DIAGNOSIS — C50411 Malignant neoplasm of upper-outer quadrant of right female breast: Secondary | ICD-10-CM | POA: Diagnosis not present

## 2020-04-23 DIAGNOSIS — F32A Depression, unspecified: Secondary | ICD-10-CM

## 2020-04-23 MED ORDER — TRAZODONE HCL 50 MG PO TABS: 25.0000 mg | ORAL_TABLET | Freq: Every evening | ORAL | 2 refills | Status: DC | PRN

## 2020-04-23 MED ORDER — SERTRALINE HCL 50 MG PO TABS
50.0000 mg | ORAL_TABLET | Freq: Every day | ORAL | 2 refills | Status: DC
Start: 2020-04-23 — End: 2020-05-26

## 2020-04-23 MED ORDER — PROMETHAZINE HCL 25 MG PO TABS: 12.5000 mg | ORAL_TABLET | Freq: Four times a day (QID) | ORAL | 0 refills | Status: DC | PRN

## 2020-04-23 NOTE — Telephone Encounter (Signed)
Do you mind calling this patient and see how we can help her?

## 2020-04-23 NOTE — Progress Notes (Signed)
I connected with [name] on 04/23/20 at  1:00 PM EDT by video enabled telemedicine visit and verified that I am speaking with the correct person using two identifiers.   I discussed the limitations, risks, security and privacy concerns of performing an evaluation and management service by telemedicine and the availability of in-person appointments. I also discussed with the patient that there may be a patient responsible charge related to this service. The patient expressed understanding and agreed to proceed.  Other persons participating in the visit and their role in the encounter:  none  Patient's location:  car Provider's location:  home  Chief Complaint: Discuss MRI results and further management  History of present illness: Patient is a 39 year old premenopausal female who felt a palpable lump in her right breast which has been present for about a year.  She had undergone an MRI abdomen May 2021 which showed early inferior right breast foci of hyperenhancement.  This was followed by a bilateral diagnostic mammogram which showed a 3.7 x 1.7 x 2 cm mass in the 9 o'clock position of the right breast 6 cm from the nipple.  No evidence of right axillary lymph adenopathy.  No evidence of left breast malignancy.  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  ER strongly positive more than 90%, PR Weakly + 11 to 50%, HER-2 IHC was equivocal but negative by FISH. Ki 67 20-30%  Currently patient feels well and denies any complaints at this time.  Menarche at the age of 39.  She is G2, P2 L2.  She has a 16 year old daughter and 52 year old son.  Age at first birth 60 years.  She has used birth control in the past.  She has undergone hysterectomy but still has her left ovary in place.  She has a history of left breast cyst in 2017 but no biopsies.  Family history significant for breast cancer in maternal aunt at the age of 28, colon cancer in  maternal uncle, colon and liver cancer in maternal grand father and maternal grandmother with non-Hodgkin's lymphoma  MRI bilateral breast showed 4 cm irregular enhancing mass in the lower outer quadrant of the right breast.  No evidence of additional malignancy in the right breast.  No evidence of left breast malignancy.  No abnormal appearing lymph nodes   Interval history: Patient did not elaborate any concerns on my visit today but did reach out to me after the visit was done with multiple issues including anxiety, poor appetite and symptoms of depression which have been addressed subsequently   Review of Systems  Constitutional: Negative for chills, fever, malaise/fatigue and weight loss.  HENT: Negative for congestion, ear discharge and nosebleeds.   Eyes: Negative for blurred vision.  Respiratory: Negative for cough, hemoptysis, sputum production, shortness of breath and wheezing.   Cardiovascular: Negative for chest pain, palpitations, orthopnea and claudication.  Gastrointestinal: Negative for abdominal pain, blood in stool, constipation, diarrhea, heartburn, melena, nausea and vomiting.  Genitourinary: Negative for dysuria, flank pain, frequency, hematuria and urgency.  Musculoskeletal: Negative for back pain, joint pain and myalgias.  Skin: Negative for rash.  Neurological: Negative for dizziness, tingling, focal weakness, seizures, weakness and headaches.  Endo/Heme/Allergies: Does not bruise/bleed easily.  Psychiatric/Behavioral: Negative for depression and suicidal ideas. The patient is nervous/anxious. The patient does not have insomnia.     Allergies  Allergen Reactions  . Bee Venom Shortness Of Breath and Swelling    Swelling  at site   . Fish Allergy Anaphylaxis and Shortness Of Breath  . Latex Hives, Shortness Of Breath, Swelling and Rash  . Penicillins Hives and Itching    Has patient had a PCN reaction causing immediate rash, facial/tongue/throat swelling, SOB or  lightheadedness with hypotension: Yes Has patient had a PCN reaction causing severe rash involving mucus membranes or skin necrosis: Yes Has patient had a PCN reaction that required hospitalization No Has patient had a PCN reaction occurring within the last 10 years: No If all of the above answers are "NO", then may proceed with Cephalosporin use.     Past Medical History:  Diagnosis Date  . Anxiety   . Asthma   . Chronic back pain   . Depression   . Family history of bladder cancer   . Family history of breast cancer   . Family history of colon cancer     Past Surgical History:  Procedure Laterality Date  . ABDOMINAL HYSTERECTOMY    . APPENDECTOMY    . CESAREAN SECTION    . DILATION AND CURETTAGE OF UTERUS      Social History   Socioeconomic History  . Marital status: Divorced    Spouse name: Not on file  . Number of children: Not on file  . Years of education: Not on file  . Highest education level: Not on file  Occupational History  . Not on file  Tobacco Use  . Smoking status: Never Smoker  . Smokeless tobacco: Never Used  Vaping Use  . Vaping Use: Never used  Substance and Sexual Activity  . Alcohol use: Yes    Comment: rarely  . Drug use: No  . Sexual activity: Not on file  Other Topics Concern  . Not on file  Social History Narrative  . Not on file   Social Determinants of Health   Financial Resource Strain:   . Difficulty of Paying Living Expenses:   Food Insecurity:   . Worried About Charity fundraiser in the Last Year:   . Arboriculturist in the Last Year:   Transportation Needs:   . Film/video editor (Medical):   Marland Kitchen Lack of Transportation (Non-Medical):   Physical Activity:   . Days of Exercise per Week:   . Minutes of Exercise per Session:   Stress:   . Feeling of Stress :   Social Connections:   . Frequency of Communication with Friends and Family:   . Frequency of Social Gatherings with Friends and Family:   . Attends Religious  Services:   . Active Member of Clubs or Organizations:   . Attends Archivist Meetings:   Marland Kitchen Marital Status:   Intimate Partner Violence:   . Fear of Current or Ex-Partner:   . Emotionally Abused:   Marland Kitchen Physically Abused:   . Sexually Abused:     Family History  Problem Relation Age of Onset  . Irritable bowel syndrome Mother   . Anxiety disorder Mother   . CAD Father   . Breast cancer Maternal Aunt 27  . Colon cancer Maternal Uncle        dx 59s  . Bladder Cancer Paternal Aunt   . Non-Hodgkin's lymphoma Maternal Grandmother 77  . Colon cancer Maternal Grandfather        dx 50s-60s  . Breast cancer Maternal Great-grandmother      Current Outpatient Medications:  .  ALPRAZolam (XANAX) 0.25 MG tablet, Take 0.5 mg by mouth 2 (two)  times daily as needed for anxiety. , Disp: , Rfl:  .  ondansetron (ZOFRAN-ODT) 4 MG disintegrating tablet, Take 4 mg by mouth every 8 (eight) hours as needed for nausea or vomiting., Disp: , Rfl:  .  promethazine (PHENERGAN) 25 MG tablet, Take 0.5-1 tablets (12.5-25 mg total) by mouth every 6 (six) hours as needed for nausea or vomiting., Disp: 30 tablet, Rfl: 0 .  sertraline (ZOLOFT) 50 MG tablet, Take 1 tablet (50 mg total) by mouth daily. Start with half tablet (34m) for one week and then may increase to 1 tablet (55m by mouth daily, Disp: 30 tablet, Rfl: 2 .  traZODone (DESYREL) 50 MG tablet, Take 0.5-1 tablets (25-50 mg total) by mouth at bedtime as needed for sleep., Disp: 30 tablet, Rfl: 2  MR BREAST BILATERAL W WO CONTRAST INC CAD  Result Date: 04/09/2020 CLINICAL DATA:  Recently diagnosed right breast carcinoma. Patient felt a lump for 1 year. Assess for extent of disease. LABS:  No labs drawn at time of imaging. EXAM: BILATERAL BREAST MRI WITH AND WITHOUT CONTRAST TECHNIQUE: Multiplanar, multisequence MR images of both breasts were obtained prior to and following the intravenous administration of 9 ml of Gadavist Three-dimensional MR  images were rendered by post-processing of the original MR data on an independent workstation. The three-dimensional MR images were interpreted, and findings are reported in the following complete MRI report for this study. Three dimensional images were evaluated at the independent DynaCad workstation COMPARISON:  Previous mammograms and breast ultrasounds. FINDINGS: Breast composition: c. Heterogeneous fibroglandular tissue. Background parenchymal enhancement: Mild. Right breast: There is an irregular enhancing mass in the right breast lower outer quadrant. Mass shows rapid wash-in and washout kinetics. The mass is associated architectural distortion and a focus of susceptibility artifact reflecting the post biopsy marker clip. The mass measures 4.0 x 2.0 x 2.6 cm in size. There are no other suspicious right breast masses and no other areas of abnormal enhancement. Right breast cysts are incidentally noted. Left breast: No suspicious mass or abnormal enhancement. Left breast cysts are incidentally noted. Lymph nodes: No abnormal appearing lymph nodes. Ancillary findings:  None. IMPRESSION: 1. 4 cm irregular enhancing mass with associated architectural distortion in the lower outer quadrant of the right breast, close to 9 o'clock, reflecting the recently biopsied right breast carcinoma, and corresponding in size to the ultrasound appearance of the mass. 2. No evidence of additional malignancy in the right breast. 3. No evidence of left breast malignancy. RECOMMENDATION: Treatment as planned for the known right breast carcinoma. BI-RADS CATEGORY  6: Known biopsy-proven malignancy. Electronically Signed   By: DaLajean Manes.D.   On: 04/09/2020 15:57   [place] LTD UNI RIGHT INC AXILLA  Result Date: 03/26/2020 CLINICAL DATA:  [age]ear old female presenting for evaluation of a palpable lump in the right breast which she has noticed for about a year. She had an abdominal MRI in May of 2021 showing some enhancement  in the right breast. She has family history of breast cancer in a maternal aunt diagnosed at about age 389EXAM: DIGITAL DIAGNOSTIC BILATERAL MAMMOGRAM WITH CAD AND TOMO ULTRASOUND RIGHT BREAST COMPARISON:  Previous exam(s). ACR Breast Density Category c: The breast tissue is heterogeneously dense, which may obscure small masses. FINDINGS: Spot compression tomosynthesis images over the palpable site of concern in the lateral posterior right breast demonstrates a broad area of distortion with probable underlying associated masses. The area spans approximately 3.8 cm mammographically. No other suspicious calcifications, masses or  areas of distortion are seen in the bilateral breasts. Mammographic images were processed with CAD. On physical exam, there is an easily palpable firm protruding mass in the lateral aspect of the right breast. Ultrasound targeted to the right breast at 9 o'clock, 6 cm from the nipple demonstrates a heterogeneous ill-defined irregular shadowing mass measuring 3.7 x 1.7 x 2.0 cm. Ultrasound of the right axilla demonstrates multiple normal-appearing lymph nodes. IMPRESSION: 1. There is a highly suspicious mass in the lateral right breast measuring approximately 3.7 cm. 2.  No evidence of right axillary lymphadenopathy. 3.  No evidence of left breast malignancy. RECOMMENDATION: 1. Ultrasound-guided biopsy is recommended for the right breast mass at 9 o'clock. 2. Presuming that the results demonstrate malignancy, consider breast MRI to determine extent of disease given the density of the patient's breast tissue, age and ill-defined appearance of the mass. I have discussed the findings and recommendations with the patient. If applicable, a reminder letter will be sent to the patient regarding the next appointment. BI-RADS CATEGORY  5: Highly suggestive of malignancy. Electronically Signed   By: Ammie Ferrier M.D.   On: 03/26/2020 16:14   MM DIAG BREAST TOMO BILATERAL  Result Date:  03/26/2020 CLINICAL DATA:  39 year old female presenting for evaluation of a palpable lump in the right breast which she has noticed for about a year. She had an abdominal MRI in May of 2021 showing some enhancement in the right breast. She has family history of breast cancer in a maternal aunt diagnosed at about age 63. EXAM: DIGITAL DIAGNOSTIC BILATERAL MAMMOGRAM WITH CAD AND TOMO ULTRASOUND RIGHT BREAST COMPARISON:  Previous exam(s). ACR Breast Density Category c: The breast tissue is heterogeneously dense, which may obscure small masses. FINDINGS: Spot compression tomosynthesis images over the palpable site of concern in the lateral posterior right breast demonstrates a broad area of distortion with probable underlying associated masses. The area spans approximately 3.8 cm mammographically. No other suspicious calcifications, masses or areas of distortion are seen in the bilateral breasts. Mammographic images were processed with CAD. On physical exam, there is an easily palpable firm protruding mass in the lateral aspect of the right breast. Ultrasound targeted to the right breast at 9 o'clock, 6 cm from the nipple demonstrates a heterogeneous ill-defined irregular shadowing mass measuring 3.7 x 1.7 x 2.0 cm. Ultrasound of the right axilla demonstrates multiple normal-appearing lymph nodes. IMPRESSION: 1. There is a highly suspicious mass in the lateral right breast measuring approximately 3.7 cm. 2.  No evidence of right axillary lymphadenopathy. 3.  No evidence of left breast malignancy. RECOMMENDATION: 1. Ultrasound-guided biopsy is recommended for the right breast mass at 9 o'clock. 2. Presuming that the results demonstrate malignancy, consider breast MRI to determine extent of disease given the density of the patient's breast tissue, age and ill-defined appearance of the mass. I have discussed the findings and recommendations with the patient. If applicable, a reminder letter will be sent to the patient  regarding the next appointment. BI-RADS CATEGORY  5: Highly suggestive of malignancy. Electronically Signed   By: Ammie Ferrier M.D.   On: 03/26/2020 16:14   MM CLIP PLACEMENT RIGHT  Result Date: 03/31/2020 CLINICAL DATA:  Post ultrasound-guided biopsy of an ill-defined mass in the right breast at the 9 o'clock position. EXAM: DIAGNOSTIC RIGHT MAMMOGRAM POST ULTRASOUND BIOPSY COMPARISON:  Previous exams. FINDINGS: Mammographic images were obtained following ultrasound guided biopsy of an ill-defined mass in the right breast at the 9 o'clock position. The ribbon shaped biopsy  marking clip is present at the site of the biopsied mass in the right breast at the 9 o'clock position. IMPRESSION: Ribbon shaped biopsy marking clip at site of biopsied mass in the right breast at the 9 o'clock position. Final Assessment: Post Procedure Mammograms for Marker Placement Electronically Signed   By: Everlean Alstrom M.D.   On: 03/31/2020 08:47   [place] RT BREAST BX W LOC DEV 1ST LESION IMG BX SPEC US GUIDE  Addendum Date: 04/02/2020   ADDENDUM REPORT: 04/02/2020 13:10 ADDENDUM: PATHOLOGY revealed: A. BREAST, RIGHT AT 9:00, 6 CM FROM THE NIPPLE; ULTRASOUND-GUIDED CORE NEEDLE BIOPSY: - INVASIVE MAMMARY CARCINOMA, NO SPECIAL TYPE. At least 11 mm in this sample. Grade 2. Ductal carcinoma in situ: Not identified. Lymphovascular invasion: Not identified. Final grading will be assessed on the final resection. Pathology results are CONCORDANT with imaging findings, per Dr. Everlean Alstrom. Pathology results and recommendations below were discussed with patient by telephone on 04/01/2020. Patient reported biopsy site doing well with slight tenderness at the site. Post biopsy care instructions were reviewed and questions were answered. Patient was instructed to call [facility] if any concerns or questions arise related to the biopsy. Recommendation: 1. Surgical referral. Request for surgical referral was relayed to [place] and Tanya Nones RN at [facility] by Electa Sniff RN on 6/16/ 2021. 2. Recommend bilateral breast MRI given patient's age, dense breast tissue and ill defined appearance of mass. Addendum by Electa Sniff RN on 04/02/2020. Electronically Signed   By: Everlean Alstrom M.D.   On: 04/02/2020 13:10   Result Date: 04/02/2020 CLINICAL DATA:  39 year old female with a suspicious palpable mass in the right breast at the approximate 9 o'clock position. EXAM: ULTRASOUND GUIDED RIGHT BREAST CORE NEEDLE BIOPSY COMPARISON:  Previous exam(s). PROCEDURE: I met with the patient and we discussed the procedure of ultrasound-guided biopsy, including benefits and alternatives. We discussed the high likelihood of a successful procedure. We discussed the risks of the procedure, including infection, bleeding, tissue injury, clip migration, and inadequate sampling. Informed written consent was given. The usual time-out protocol was performed immediately prior to the procedure. Lesion quadrant: Upper-outer Using sterile technique and 1% Lidocaine as local anesthetic, under direct ultrasound visualization, a 14 gauge spring-loaded device was used to perform biopsy of the heterogeneous ill-defined mass in the right breast at the 9 o'clock position using a lateral to medial approach. At the conclusion of the procedure wing shaped tissue marker clip was deployed into the biopsy cavity. Follow up 2 view mammogram was performed and dictated separately. IMPRESSION: Ultrasound guided biopsy of the mass in the right breast at the 9 o'clock position. No apparent complications. Electronically Signed: By: Everlean Alstrom M.D. On: 03/31/2020 08:46    No images are attached to the encounter.   CMP Latest Ref Rng & Units 03/04/2020  Glucose 70 - 99 mg/dL 130(H)  BUN 6 - 20 mg/dL 11  Creatinine 0.44 - 1.00 mg/dL 0.70  Sodium 135 - 145 mmol/L 138  Potassium 3.5 - 5.1 mmol/L 4.3  Chloride 98 - 111 mmol/L 105  CO2 22  - 32 mmol/L 25  Calcium 8.9 - 10.3 mg/dL 8.6(L)  Total Protein 6.5 - 8.1 g/dL 6.8  Total Bilirubin 0.3 - 1.2 mg/dL 0.9  Alkaline Phos 38 - 126 U/L 126  AST 15 - 41 U/L 102(H)  ALT 0 - 44 U/L 75(H)   CBC Latest Ref Rng & Units 03/04/2020  WBC 4.0 - 10.5  K/uL 8.7  Hemoglobin 12.0 - 15.0 g/dL 11.3(L)  Hematocrit 36 - 46 % 34.0(L)  Platelets 150 - 400 K/uL 216     Observation/objective: Appears in no acute distress over video visit today.  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  She is here to discuss further management  I discussed the results of the MRI with the patient which shows a 4 cm mass in the right breast.  No additional areas of abnormality.  No abnormal adenopathy.  No masses in the left breast.Tumor was ER strongly +90%, PR 11 to 50% positive and HER-2 negative by FISH.  I have also reached out to Dr. [name] who feels that lumpectomy would be okay from a surgical standpoint and she would not necessarily need a mastectomy for the 4 cm right breast mass.  Since breast conservation is possible and we are not seeing any abnormal lymph nodes on a mammogram as well as MRI-I would recommend upfront lumpectomy with sentinel lymph node biopsy.  I will see the patient after the surgery to discuss final pathology and further management.  If lymph nodes are positive or the size of her final breast mass is greater than or equal to 5 cm she will need adjuvant chemotherapy.  However if lymph nodes are negative and breast mass is less than 5 cm-I will obtain mammaprint to decide if she would benefit from adjuvant chemotherapy or not.  Discussed what MammaPrint testing is and how the results are interpreted.  If she falls in the low risk group she would not benefit from adjuvant  chemotherapy.  However chemo would be recommended if she falls in the high risk group.  I will discuss details of chemotherapy if need be after final pathology is back.  Treatment will be given with a curative intent  Given that her tumor is ER positive there would be role for hormone therapy upon completion of lumpectomy and adjuvant radiation treatment  She was seen for genetic counseling and underwent a stat panel for [employer] which did not show any evidence of pathogenic mutations.    Cancer Staging Malignant neoplasm of upper-outer quadrant of right female breast [facility]) Staging form: Breast, AJCC 8th Edition - Clinical stage from 04/22/2020: Stage IB (cT2, cN0, cM0, G2, ER+, PR+, HER2-) - Signed by Sindy Guadeloupe, MD on 04/23/2020    Follow-up instructions:I will tentatively see her on 05/12/2020 to discuss further management  I discussed the assessment and treatment plan with the patient. The patient was provided an opportunity to ask questions and all were answered. The patient agreed with the plan and demonstrated an understanding of the instructions.   The patient was advised to call back or seek an in-person evaluation if the symptoms worsen or if the condition fails to improve as anticipated.    Visit Diagnosis: 1. Goals of care, counseling/discussion   2. Malignant neoplasm of upper-outer quadrant of right breast in female, estrogen receptor positive ([place])     Dr. Randa Evens, MD, MPH [facility] at [facility] Tel- [phone] 04/23/2020 12:24 PM

## 2020-04-23 NOTE — Progress Notes (Signed)
Virtual Visit via Telephone Note  I connected with [name] on 04/23/20 at 10:30 AM EDT by telephone and verified that I am speaking with the correct person using two identifiers.   I discussed the limitations, risks, security and privacy concerns of performing an evaluation and management service by telephone and the availability of in person appointments. I also discussed with the patient that there may be a patient responsible charge related to this service. The patient expressed understanding and agreed to proceed.   History of Present Illness: [name] is a 39 year old postmenopausal woman who has had a palpable right breast mass for about a year. She underwent MRI abdomen May 2021 which showed right breast hyperenhancement. Patient ultimately underwent biopsy revealing invasive mammary carcinoma. She is pending lumpectomy and sentinel node biopsy.    Observations/Objective: Patient has a history of depression/anxiety/insomnia and was previously treated on Zoloft, Wellbutrin, Klonopin, and trazodone but was weaned from those medications in 2018. Patient endorses recurrent anxiety/depression since she received the diagnosis of breast cancer. She is having difficulty eating and has had some nausea but no vomiting. She is having difficulty with initiating and maintaining sleep at night. Her PCP has started her on alprazolam as needed at bedtime but this has not helped. Patient says that she is interested in restarting an antidepressant. She verbalized good tolerance previously to Zoloft and trazodone. She recognizes that Zoloft may take weeks to be effective. No SI/HI.  Discussed the patient eventually would need to transition antidepressant regimen to PCP his medications will require long-term. 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  Regarding nausea, patient is taking Zofran but has  not found it to be particularly helpful. She reports having previously taken Phenergan and found it more efficacious.  Assessment and Plan: Breast cancer -pending lumpectomy on 7/22 by Dr. [name]. Followed by Dr. Janese Banks  Depression -situational related to her breast cancer. Restart Zoloft 25 mg daily x 1 week and then 50 mg daily thereafter. Consider referral to counseling if needed.  Anxiety -continue alprazolam as needed  Insomnia -restart trazodone 25 to 50 mg nightly as needed  Nausea -start Phenergan 12.5 to 25 mg every 6 hours as needed  Case and plan discussed with Dr. Janese Banks  Follow Up Instructions: Follow-up virtual visit 1 month   I discussed the assessment and treatment plan with the patient. The patient was provided an opportunity to ask questions and all were answered. The patient agreed with the plan and demonstrated an understanding of the instructions.   The patient was advised to call back or seek an in-person evaluation if the symptoms worsen or if the condition fails to improve as anticipated.  I provided 11 minutes of non-face-to-face time during this encounter.   [name], NP

## 2020-04-30 ENCOUNTER — Other Ambulatory Visit: Payer: Self-pay

## 2020-04-30 ENCOUNTER — Encounter
Admission: RE | Admit: 2020-04-30 | Discharge: 2020-04-30 | Disposition: A | Discharge status: Home / Self Care | Payer: Managed Care, Other (non HMO) | Source: Ambulatory Visit | Attending: Surgery | Admitting: Surgery

## 2020-04-30 DIAGNOSIS — Z01818 Encounter for other preprocedural examination: Secondary | ICD-10-CM | POA: Insufficient documentation

## 2020-04-30 HISTORY — DX: Personal history of urinary calculi: Z87.442

## 2020-04-30 NOTE — Patient Instructions (Addendum)
INSTRUCTIONS FOR SURGERY     Your surgery is scheduled for:   Thursday, July 22ND                       You need to arrive at the [facility] at 7:30 am.  Once you have checked in, someone         Will guide you to Mammography.      REMEMBER: Instructions that are not followed completely may result in serious medical risk,  up to and including death, or upon the discretion of your surgeon and anesthesiologist,            your surgery may need to be rescheduled.  __X__ 1. Do not eat food after midnight the night before your procedure.                    No gum, candy, lozenger, tic tacs, tums or hard candies.                  ABSOLUTELY NOTHING SOLID IN YOUR MOUTH AFTER MIDNIGHT                    You may drink unlimited clear liquids up to 2 hours before you are scheduled to arrive for surgery.                   Do not drink anything within those 2 hours unless you need to take medicine, then take the                   smallest amount you need.  Clear liquids include:  water, apple juice without pulp,                   any flavor Gatorade, Black coffee, black tea.  Sugar may be added but no dairy/ honey /lemon.                        Broth and jello is not considered a clear liquid.  __x__  2. On the morning of surgery, please brush your teeth with toothpaste and water. You may rinse with                  mouthwash if you wish but DO NOT SWALLOW TOOTHPASTE OR MOUTHWASH  __X___3. NO alcohol for 24 hours before or after surgery.  __x___ 4.  Do NOT smoke or use e-cigarettes for 24 HOURS PRIOR TO SURGERY.                      DO NOT Use any chewable tobacco products for at least 6 hours prior to surgery.  __x___ 5. If you start any new medication after this appointment and prior to surgery, please                   Bring it with you on the day of surgery.  ___x__ 6. Notify your doctor if there is any change in your medical  condition, such as fever, infection, vomitting,  Diarrhea or any open sores.  __x___ 7.  USE the CHG SOAP as instructed, the night before surgery and the day of surgery.                   Once you have washed with this soap, do NOT use any of the following: Powders, perfumes                    or lotions. Please do not wear make up, hairpins, clips or nail polish. You MAY NOT wear deodorant.                   Men may shave their face and neck.  Women need to shave 48 hours prior to surgery.                   DO NOT wear ANY jewelry on the day of surgery. If there are rings that are too tight to                    remove easily, please address this prior to the surgery day. Piercings need to be removed.                                                                     NO METAL ON YOUR BODY.                    Do NOT bring any valuables.  If you came to Pre-Admit testing then you will not need license,                     insurance card or credit card.  If you will be staying overnight, please either leave your things in                     the car or have your family be responsible for these items.                     [facility] IS NOT RESPONSIBLE FOR BELONGINGS OR VALUABLES.  ___X__ 8. DO NOT wear contact lenses on surgery day.  You may not have dentures,                     Hearing aides, contacts or glasses in the operating room. These items can be                    Placed in the Recovery Room to receive immediately after surgery.  __x___ 9. IF YOU ARE SCHEDULED TO GO HOME ON THE SAME DAY, YOU MUST                   Have someone to drive you home and to stay with you  for the first 24 hours.                    Have an arrangement prior to arriving on surgery day.  ___x__ 10. Take the following medications on the morning of surgery with a sip of water:  1.  XANAX                     2.  PHENERGAN, if needed                     3.  _____  11.  Follow any instructions provided to you by your surgeon.                        Such as enema, clear liquid bowel prep  __X__  12. STOP ALL ASPIRIN PRODUCTS 1 WEEK PRIOR TO SURGERY.                       THIS INCLUDES BC POWDERS / GOODIES POWDER  __x___ 13. STOP Anti-inflammatories as of: 1 WEEK PRIOR TO SURGERY.                      This includes IBUPROFEN / MOTRIN / ADVIL / ALEVE/ NAPROXYN                    YOU MAY TAKE TYLENOL ANY TIME PRIOR TO SURGERY.  ______17.  Continue to take the following medications but do not take on the morning of surgery:   __X____18.   Wear clean and comfortable clothing to the hospital.                       BRING A SPORTS BRA WITH YOU THAT ZIPS OR SNAPS IN THE FRONT.                         HAVE A SHIRT THAT IS EASY TO GET INTO FOR AFTER SURGERY.  HAVE PHONE NUMBERS FOR YOUR CONTACT PEOPLE.  PLEASE WEAR GLASSES, NOT CONTACTS.

## 2020-05-05 ENCOUNTER — Other Ambulatory Visit
Admission: RE | Admit: 2020-05-05 | Discharge: 2020-05-05 | Disposition: A | Discharge status: Home / Self Care | Payer: Managed Care, Other (non HMO) | Source: Ambulatory Visit | Attending: Surgery | Admitting: Surgery

## 2020-05-05 ENCOUNTER — Other Ambulatory Visit: Payer: Self-pay

## 2020-05-05 ENCOUNTER — Ambulatory Visit
Admission: RE | Admit: 2020-05-05 | Discharge: 2020-05-05 | Disposition: A | Discharge status: Home / Self Care | Payer: Managed Care, Other (non HMO) | Source: Ambulatory Visit | Attending: Surgery | Admitting: Surgery

## 2020-05-05 DIAGNOSIS — Z01818 Encounter for other preprocedural examination: Secondary | ICD-10-CM | POA: Diagnosis present

## 2020-05-05 DIAGNOSIS — Z170 Estrogen receptor positive status [ER+]: Secondary | ICD-10-CM | POA: Insufficient documentation

## 2020-05-05 DIAGNOSIS — C50511 Malignant neoplasm of lower-outer quadrant of right female breast: Secondary | ICD-10-CM | POA: Insufficient documentation

## 2020-05-05 DIAGNOSIS — Z20822 Contact with and (suspected) exposure to covid-19: Secondary | ICD-10-CM | POA: Diagnosis not present

## 2020-05-05 LAB — CBC
HCT: 37.9 % (ref 36.0–46.0)
Hemoglobin: 13.4 g/dL (ref 12.0–15.0)
MCH: 30.2 pg (ref 26.0–34.0)
MCHC: 35.4 g/dL (ref 30.0–36.0)
MCV: 85.6 fL (ref 80.0–100.0)
Platelets: 276 10*3/uL (ref 150–400)
RBC: 4.43 MIL/uL (ref 3.87–5.11)
RDW: 12.8 % (ref 11.5–15.5)
WBC: 6.6 10*3/uL (ref 4.0–10.5)
nRBC: 0 % (ref 0.0–0.2)

## 2020-05-05 LAB — SARS CORONAVIRUS 2 (TAT 6-24 HRS): SARS Coronavirus 2: NEGATIVE

## 2020-05-06 ENCOUNTER — Other Ambulatory Visit: Payer: Self-pay

## 2020-05-06 MED ORDER — PROPOFOL 500 MG/50ML IV EMUL
INTRAVENOUS | Status: AC
  Filled 2020-05-06: qty 50

## 2020-05-06 MED ORDER — LIDOCAINE HCL (PF) 2 % IJ SOLN
INTRAMUSCULAR | Status: AC
  Filled 2020-05-06: qty 5

## 2020-05-07 ENCOUNTER — Encounter: Payer: Self-pay | Admitting: Surgery

## 2020-05-07 ENCOUNTER — Ambulatory Visit
Admission: RE | Admit: 2020-05-07 | Discharge: 2020-05-07 | Disposition: A | Discharge status: Home / Self Care | Payer: Managed Care, Other (non HMO) | Source: Ambulatory Visit | Attending: Surgery | Admitting: Surgery

## 2020-05-07 ENCOUNTER — Encounter
Admission: RE | Disposition: A | Discharge status: Home / Self Care | Payer: Self-pay | Source: Ambulatory Visit | Attending: Surgery

## 2020-05-07 ENCOUNTER — Encounter (HOSPITAL_BASED_OUTPATIENT_CLINIC_OR_DEPARTMENT_OTHER)
Admission: RE | Admit: 2020-05-07 | Discharge: 2020-05-07 | Disposition: A | Discharge status: Home / Self Care | Payer: Managed Care, Other (non HMO) | Source: Ambulatory Visit | Attending: Surgery | Admitting: Surgery

## 2020-05-07 ENCOUNTER — Ambulatory Visit: Payer: Managed Care, Other (non HMO) | Admitting: Anesthesiology

## 2020-05-07 DIAGNOSIS — J45909 Unspecified asthma, uncomplicated: Secondary | ICD-10-CM | POA: Insufficient documentation

## 2020-05-07 DIAGNOSIS — C50511 Malignant neoplasm of lower-outer quadrant of right female breast: Secondary | ICD-10-CM | POA: Diagnosis present

## 2020-05-07 DIAGNOSIS — G40909 Epilepsy, unspecified, not intractable, without status epilepticus: Secondary | ICD-10-CM | POA: Insufficient documentation

## 2020-05-07 DIAGNOSIS — Z170 Estrogen receptor positive status [ER+]: Secondary | ICD-10-CM | POA: Diagnosis not present

## 2020-05-07 DIAGNOSIS — K219 Gastro-esophageal reflux disease without esophagitis: Secondary | ICD-10-CM | POA: Diagnosis not present

## 2020-05-07 DIAGNOSIS — F419 Anxiety disorder, unspecified: Secondary | ICD-10-CM | POA: Diagnosis not present

## 2020-05-07 DIAGNOSIS — F329 Major depressive disorder, single episode, unspecified: Secondary | ICD-10-CM | POA: Diagnosis not present

## 2020-05-07 DIAGNOSIS — C50411 Malignant neoplasm of upper-outer quadrant of right female breast: Secondary | ICD-10-CM

## 2020-05-07 DIAGNOSIS — Z79899 Other long term (current) drug therapy: Secondary | ICD-10-CM | POA: Diagnosis not present

## 2020-05-07 HISTORY — PX: PARTIAL MASTECTOMY WITH NEEDLE LOCALIZATION AND AXILLARY SENTINEL LYMPH NODE BX: SHX6009

## 2020-05-07 SURGERY — PARTIAL MASTECTOMY WITH NEEDLE LOCALIZATION AND AXILLARY SENTINEL LYMPH NODE BX
Anesthesia: General | Site: Breast | Laterality: Right

## 2020-05-07 MED ORDER — ACETAMINOPHEN 500 MG PO TABS
1000.0000 mg | ORAL_TABLET | ORAL | Status: AC
  Administered 2020-05-07: 1000 mg via ORAL

## 2020-05-07 MED ORDER — FENTANYL CITRATE (PF) 100 MCG/2ML IJ SOLN
25.0000 ug | INTRAMUSCULAR | Status: DC | PRN
  Administered 2020-05-07: 50 ug via INTRAVENOUS

## 2020-05-07 MED ORDER — TECHNETIUM TC 99M SULFUR COLLOID FILTERED
1.0000 | Freq: Once | INTRAVENOUS | Status: AC | PRN
  Administered 2020-05-07: 0.869 via INTRADERMAL

## 2020-05-07 MED ORDER — MIDAZOLAM HCL 2 MG/2ML IJ SOLN
INTRAMUSCULAR | Status: DC | PRN
  Administered 2020-05-07: 2 mg via INTRAVENOUS

## 2020-05-07 MED ORDER — PHENYLEPHRINE HCL (PRESSORS) 10 MG/ML IV SOLN
INTRAVENOUS | Status: DC | PRN
  Administered 2020-05-07 (×3): 50 ug via INTRAVENOUS

## 2020-05-07 MED ORDER — SEVOFLURANE IN SOLN
RESPIRATORY_TRACT | Status: AC
  Filled 2020-05-07: qty 250

## 2020-05-07 MED ORDER — DEXMEDETOMIDINE HCL IN NACL 80 MCG/20ML IV SOLN
INTRAVENOUS | Status: AC
  Filled 2020-05-07: qty 20

## 2020-05-07 MED ORDER — CHLORHEXIDINE GLUCONATE CLOTH 2 % EX PADS: 6.0000 | MEDICATED_PAD | Freq: Once | CUTANEOUS | Status: DC

## 2020-05-07 MED ORDER — CELECOXIB 200 MG PO CAPS
200.0000 mg | ORAL_CAPSULE | ORAL | Status: AC
  Administered 2020-05-07: 200 mg via ORAL

## 2020-05-07 MED ORDER — ACETAMINOPHEN 500 MG PO TABS
ORAL_TABLET | ORAL | Status: AC
  Filled 2020-05-07: qty 2

## 2020-05-07 MED ORDER — LIDOCAINE HCL 1 % IJ SOLN
INTRAMUSCULAR | Status: DC | PRN
  Administered 2020-05-07: 16.5 mL

## 2020-05-07 MED ORDER — ACETAMINOPHEN 325 MG PO TABS: 325.0000 mg | ORAL_TABLET | ORAL | Status: DC | PRN

## 2020-05-07 MED ORDER — CHLORHEXIDINE GLUCONATE 0.12 % MT SOLN
OROMUCOSAL | Status: AC
  Administered 2020-05-07: 15 mL via OROMUCOSAL
  Filled 2020-05-07: qty 15

## 2020-05-07 MED ORDER — CELECOXIB 200 MG PO CAPS
ORAL_CAPSULE | ORAL | Status: AC
  Filled 2020-05-07: qty 1

## 2020-05-07 MED ORDER — LIDOCAINE HCL (CARDIAC) PF 100 MG/5ML IV SOSY
PREFILLED_SYRINGE | INTRAVENOUS | Status: DC | PRN
  Administered 2020-05-07: 100 mg via INTRAVENOUS

## 2020-05-07 MED ORDER — FAMOTIDINE 20 MG PO TABS
ORAL_TABLET | ORAL | Status: AC
  Filled 2020-05-07: qty 1

## 2020-05-07 MED ORDER — CHLORHEXIDINE GLUCONATE 0.12 % MT SOLN: 15.0000 mL | Freq: Once | OROMUCOSAL | Status: AC

## 2020-05-07 MED ORDER — ORAL CARE MOUTH RINSE: 15.0000 mL | Freq: Once | OROMUCOSAL | Status: AC

## 2020-05-07 MED ORDER — ACETAMINOPHEN 325 MG PO TABS
650.0000 mg | ORAL_TABLET | Freq: Three times a day (TID) | ORAL | 0 refills | Status: AC | PRN
Start: 2020-05-07 — End: 2020-06-06

## 2020-05-07 MED ORDER — GABAPENTIN 300 MG PO CAPS
ORAL_CAPSULE | ORAL | Status: AC
  Filled 2020-05-07: qty 1

## 2020-05-07 MED ORDER — FENTANYL CITRATE (PF) 100 MCG/2ML IJ SOLN
INTRAMUSCULAR | Status: DC | PRN
  Administered 2020-05-07 (×2): 50 ug via INTRAVENOUS

## 2020-05-07 MED ORDER — CLINDAMYCIN PHOSPHATE 900 MG/50ML IV SOLN
900.0000 mg | INTRAVENOUS | Status: AC
  Administered 2020-05-07: 900 mg via INTRAVENOUS

## 2020-05-07 MED ORDER — DEXMEDETOMIDINE HCL 200 MCG/2ML IV SOLN
INTRAVENOUS | Status: DC | PRN
  Administered 2020-05-07 (×2): 4 ug via INTRAVENOUS

## 2020-05-07 MED ORDER — ACETAMINOPHEN 160 MG/5ML PO SOLN
325.0000 mg | ORAL | Status: DC | PRN
  Filled 2020-05-07: qty 20.3

## 2020-05-07 MED ORDER — PROPOFOL 10 MG/ML IV BOLUS
INTRAVENOUS | Status: AC
  Filled 2020-05-07: qty 20

## 2020-05-07 MED ORDER — LACTATED RINGERS IV SOLN: INTRAVENOUS | Status: DC

## 2020-05-07 MED ORDER — LIDOCAINE HCL (PF) 2 % IJ SOLN
INTRAMUSCULAR | Status: AC
  Filled 2020-05-07: qty 5

## 2020-05-07 MED ORDER — GABAPENTIN 300 MG PO CAPS
300.0000 mg | ORAL_CAPSULE | ORAL | Status: AC
  Administered 2020-05-07: 300 mg via ORAL

## 2020-05-07 MED ORDER — BUPIVACAINE-EPINEPHRINE (PF) 0.5% -1:200000 IJ SOLN
INTRAMUSCULAR | Status: AC
  Filled 2020-05-07: qty 30

## 2020-05-07 MED ORDER — IBUPROFEN 800 MG PO TABS
800.0000 mg | ORAL_TABLET | Freq: Three times a day (TID) | ORAL | 0 refills | Status: DC | PRN
Start: 2020-05-07 — End: 2020-06-11

## 2020-05-07 MED ORDER — FENTANYL CITRATE (PF) 100 MCG/2ML IJ SOLN
INTRAMUSCULAR | Status: AC
  Filled 2020-05-07: qty 2

## 2020-05-07 MED ORDER — DEXAMETHASONE SODIUM PHOSPHATE 10 MG/ML IJ SOLN
INTRAMUSCULAR | Status: DC | PRN
  Administered 2020-05-07: 10 mg via INTRAVENOUS

## 2020-05-07 MED ORDER — LIDOCAINE HCL (PF) 1 % IJ SOLN
INTRAMUSCULAR | Status: AC
  Filled 2020-05-07: qty 30

## 2020-05-07 MED ORDER — LACTATED RINGERS IV SOLN: INTRAVENOUS | Status: DC | PRN

## 2020-05-07 MED ORDER — FAMOTIDINE 20 MG PO TABS
20.0000 mg | ORAL_TABLET | Freq: Once | ORAL | Status: AC
  Administered 2020-05-07: 20 mg via ORAL

## 2020-05-07 MED ORDER — PROPOFOL 10 MG/ML IV BOLUS
INTRAVENOUS | Status: DC | PRN
  Administered 2020-05-07: 150 mg via INTRAVENOUS

## 2020-05-07 MED ORDER — PROMETHAZINE HCL 25 MG/ML IJ SOLN: 6.2500 mg | INTRAMUSCULAR | Status: DC | PRN

## 2020-05-07 MED ORDER — DROPERIDOL 2.5 MG/ML IJ SOLN
0.6250 mg | Freq: Once | INTRAMUSCULAR | Status: DC | PRN
  Filled 2020-05-07: qty 2

## 2020-05-07 MED ORDER — CLINDAMYCIN PHOSPHATE 900 MG/50ML IV SOLN
INTRAVENOUS | Status: AC
  Filled 2020-05-07: qty 50

## 2020-05-07 MED ORDER — FENTANYL CITRATE (PF) 100 MCG/2ML IJ SOLN
INTRAMUSCULAR | Status: AC
  Administered 2020-05-07: 50 ug via INTRAVENOUS
  Filled 2020-05-07: qty 2

## 2020-05-07 MED ORDER — DOCUSATE SODIUM 100 MG PO CAPS
100.0000 mg | ORAL_CAPSULE | Freq: Two times a day (BID) | ORAL | 0 refills | Status: AC | PRN
Start: 2020-05-07 — End: 2020-05-17

## 2020-05-07 MED ORDER — ONDANSETRON HCL 4 MG/2ML IJ SOLN
INTRAMUSCULAR | Status: DC | PRN
  Administered 2020-05-07: 4 mg via INTRAVENOUS

## 2020-05-07 MED ORDER — HYDROCODONE-ACETAMINOPHEN 7.5-325 MG PO TABS: 1.0000 | ORAL_TABLET | Freq: Once | ORAL | Status: DC | PRN

## 2020-05-07 MED ORDER — BUPIVACAINE-EPINEPHRINE 0.5% -1:200000 IJ SOLN
INTRAMUSCULAR | Status: DC | PRN
  Administered 2020-05-07: 16.5 mL

## 2020-05-07 MED ORDER — MIDAZOLAM HCL 2 MG/2ML IJ SOLN
INTRAMUSCULAR | Status: AC
  Filled 2020-05-07: qty 2

## 2020-05-07 MED ORDER — HYDROCODONE-ACETAMINOPHEN 5-325 MG PO TABS: 1.0000 | ORAL_TABLET | Freq: Four times a day (QID) | ORAL | 0 refills | Status: DC | PRN

## 2020-05-07 SURGICAL SUPPLY — 49 items
ADH SKN CLS APL DERMABOND .7 (GAUZE/BANDAGES/DRESSINGS) ×1
APL PRP STRL LF DISP 70% ISPRP (MISCELLANEOUS) ×1
APPLIER CLIP 11 MED OPEN (CLIP)
APR CLP MED 11 20 MLT OPN (CLIP)
BLADE SURG 15 STRL LF DISP TIS (BLADE) ×1 IMPLANT
BLADE SURG 15 STRL SS (BLADE) ×2
CANISTER SUCT 1200ML W/VALVE (MISCELLANEOUS) ×2 IMPLANT
CHLORAPREP W/TINT 26 (MISCELLANEOUS) ×2 IMPLANT
CLIP APPLIE 11 MED OPEN (CLIP) IMPLANT
CNTNR SPEC 2.5X3XGRAD LEK (MISCELLANEOUS)
CONT SPEC 4OZ STER OR WHT (MISCELLANEOUS)
CONT SPEC 4OZ STRL OR WHT (MISCELLANEOUS)
CONTAINER SPEC 2.5X3XGRAD LEK (MISCELLANEOUS) IMPLANT
COVER WAND RF STERILE (DRAPES) ×2 IMPLANT
DERMABOND ADVANCED (GAUZE/BANDAGES/DRESSINGS) ×1
DERMABOND ADVANCED .7 DNX12 (GAUZE/BANDAGES/DRESSINGS) ×1 IMPLANT
DEVICE DSSCT PLSMBLD 3.0S LGHT (MISCELLANEOUS) ×1 IMPLANT
DEVICE DUBIN SPECIMEN MAMMOGRA (MISCELLANEOUS) ×2 IMPLANT
DRAPE LAPAROTOMY 77X122 PED (DRAPES) ×2 IMPLANT
ELECT CAUTERY BLADE TIP 2.5 (TIP) ×2
ELECT REM PT RETURN 9FT ADLT (ELECTROSURGICAL) ×2
ELECTRODE CAUTERY BLDE TIP 2.5 (TIP) ×1 IMPLANT
ELECTRODE REM PT RTRN 9FT ADLT (ELECTROSURGICAL) ×1 IMPLANT
GLOVE BIOGEL PI IND STRL 7.0 (GLOVE) ×1 IMPLANT
GLOVE BIOGEL PI INDICATOR 7.0 (GLOVE) ×1
GLOVE SURG SYN 6.5 ES PF (GLOVE) ×4 IMPLANT
GLOVE SURG SYN 6.5 PF PI (GLOVE) ×2 IMPLANT
GOWN STRL REUS W/ TWL LRG LVL3 (GOWN DISPOSABLE) ×3 IMPLANT
GOWN STRL REUS W/TWL LRG LVL3 (GOWN DISPOSABLE) ×6
KIT MARKER MARGIN INK (KITS) IMPLANT
KIT TURNOVER KIT A (KITS) ×2 IMPLANT
LABEL OR SOLS (LABEL) ×2 IMPLANT
LIGHT WAVEGUIDE WIDE FLAT (MISCELLANEOUS) IMPLANT
MARKER MARGIN CORRECT CLIP (MARKER) IMPLANT
NEEDLE HYPO 22GX1.5 SAFETY (NEEDLE) ×4 IMPLANT
PACK BASIN MINOR (MISCELLANEOUS) ×2 IMPLANT
PLASMABLADE 3.0S W/LIGHT (MISCELLANEOUS) ×2
SET LOCALIZER 20 PROBE US (MISCELLANEOUS) ×2 IMPLANT
SUT MNCRL 4-0 (SUTURE) ×4
SUT MNCRL 4-0 27XMFL (SUTURE) ×2
SUT SILK 2 0 SH (SUTURE) IMPLANT
SUT SILK 3 0 12 30 (SUTURE) IMPLANT
SUT VIC AB 3-0 SH 27 (SUTURE) ×4
SUT VIC AB 3-0 SH 27X BRD (SUTURE) ×2 IMPLANT
SUTURE MNCRL 4-0 27XMF (SUTURE) ×2 IMPLANT
SYR 20ML LL LF (SYRINGE) ×2 IMPLANT
SYR BULB IRRIG 60ML STRL (SYRINGE) ×2 IMPLANT
TUBING CONNECTING 10 (TUBING) ×2 IMPLANT
WATER STERILE IRR 1000ML POUR (IV SOLUTION) ×2 IMPLANT

## 2020-05-07 NOTE — Anesthesia Procedure Notes (Signed)
Procedure Name: LMA Insertion Date/Time: 05/07/2020 10:24 AM Performed by: Justus Memory, CRNA Pre-anesthesia Checklist: Patient identified, Patient being monitored, Timeout performed, Emergency Drugs available and Suction available Patient Re-evaluated:Patient Re-evaluated prior to induction Oxygen Delivery Method: Circle system utilized Preoxygenation: Pre-oxygenation with 100% oxygen Induction Type: IV induction Ventilation: Mask ventilation without difficulty LMA: LMA inserted LMA Size: 3.5 Tube type: Oral Number of attempts: 1 Placement Confirmation: positive ETCO2 and breath sounds checked- equal and bilateral Tube secured with: Tape Dental Injury: Teeth and Oropharynx as per pre-operative assessment

## 2020-05-07 NOTE — Anesthesia Postprocedure Evaluation (Signed)
Anesthesia Post Note  Patient: [name]  Procedure(s) Performed: PARTIAL MASTECTOMY WITH Radiofrequency tag AND AXILLARY SENTINEL LYMPH NODE BX (Right Breast)  Patient location during evaluation: PACU Anesthesia Type: General Level of consciousness: awake and alert Pain management: pain level controlled Vital Signs Assessment: post-procedure vital signs reviewed and stable Respiratory status: spontaneous breathing, nonlabored ventilation and respiratory function stable Cardiovascular status: blood pressure returned to baseline and stable Postop Assessment: no apparent nausea or vomiting Anesthetic complications: no   No complications documented.   Last Vitals:  Vitals:   05/07/20 1243 05/07/20 1250  BP:  (!) 130/76  Pulse: 90 86  Resp: 20 20  Temp: 37 C (!) 36.3 C  SpO2: 98% 99%    Last Pain:  Vitals:   05/07/20 1250  TempSrc: Temporal  PainSc: 4                  Chinmay Squier Harvie Heck
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
## 2020-05-07 NOTE — Transfer of Care (Signed)
Immediate Anesthesia Transfer of Care Note  Patient: [name]  Procedure(s) Performed: PARTIAL MASTECTOMY WITH Radiofrequency tag AND AXILLARY SENTINEL LYMPH NODE BX (Right Breast)  Patient Location: PACU  Anesthesia Type:General  Level of Consciousness: sedated  Airway & Oxygen Therapy: Patient Spontanous Breathing and Patient connected to face mask oxygen  Post-op Assessment: Report given to RN and Post -op Vital signs reviewed and stable  Post vital signs: Reviewed and stable  Last Vitals:  Vitals Value Taken Time  BP    Temp    Pulse    Resp    SpO2      Last Pain:  Vitals:   05/07/20 0844  TempSrc: Temporal  PainSc: 2          Complications: No complications documented.

## 2020-05-07 NOTE — Interval H&P Note (Signed)
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  The various methods of treatment have been discussed with the patient and family. After consideration of risks, benefits and other options for treatment, the patient has consented to  Procedure(s): PARTIAL MASTECTOMY WITH Radiofrequency tag AND AXILLARY SENTINEL LYMPH NODE BX (Right) as a surgical intervention.  The patient's history has been reviewed, patient examined, no change in status, stable for surgery.  I have reviewed the patient's chart and labs.  Questions were answered to the patient's satisfaction.     Derrin Currey [name]

## 2020-05-07 NOTE — Op Note (Signed)
Preoperative diagnosis:  right breast carcinoma.  Postoperative diagnosis: right .   Procedure: RF tag-localized right  breast partial mastectomy.                      right  Axillary Sentinel Lymph node biopsy  Anesthesia: GETA  Surgeon: Dr. Benjamine Sprague  Wound Classification: Clean  Indications: Patient is a 39 y.o. female with a right  breast mass noted on mammography with core biopsy demonstrating right breast CA requires RF localizer placement, partial mastectomy for treatment with sentinel lymph node biopsy.   Specimen: Right Breast mass, Sentinel Lymph nodes x 2  Complications: None  Estimated Blood Loss: 27mL  Findings: 1. Specimen mammography shows marker and RF localizer on specimen 2. Pathology call refers gross examination of margins was negative 3.additional palpable nodules and lymph nodes resected   Description of procedure: RF localization was performed by radiology prior to procedure. In the nuclear medicine suite, the subareolar region was injected with Tc-99 sulfur colloid the morning of procedure. Localization studies were reviewed. The patient was taken to the operating room and placed supine on the operating table, and after general anesthesia the right breast and axilla were prepped and draped in the usual sterile fashion. A time-out was completed verifying correct patient, procedure, site, positioning, and implant(s) and/or special equipment prior to beginning this procedure.  By identifying the RF localizer, the probable trajectory and location of the mass was visualized. A skin incision was planned in such a way as to minimize the amount of dissection to reach the mass.  The skin incision was made after infusion of local. Flaps were raised and  Sharp and blunt dissection was then taken down to the mass, taking care to include the entire RF localizer and a margin of grossly normal tissue.  Of note, during dissection, nodular like tissue x3 or so palpated separate  from mass at inferior aspect, so additional tissue taken toward inferior margin to ensure all palpable abnormal tissue incorporated into specimen.  The specimen was removed. The specimen was oriented with paint and sent to radiology with the localization studies. Confirmation was received that the entire target lesion had been resected. A hand-held gamma probe was used to identify the location of the hottest spot in the axilla. An incision was made around the caudal axillary hairline. Sharp and blunt Dissection was carried down to subdermal facias. The probe was placed within wound and again, the point of maximal count was found. Dissection continue until nodule was identified. The probe was placed in contact with the node and 1700 counts were recorded. The node was excised in its entirety. Ex vivo, the node measured 1700 counts when placed on the probe. The bed of the node measured <200 counts. An additional hot spot was detected and the node was excised in similar fashion. No additional hot spots were identified. Palpable nodes surrounding the hot spots were resected together with the hot node, and no additional palpable pathology was noted after all the hot nodes removed.  Of note, the node bed was adjacent to the chest wall and several vessels required ligation due to adhesions to the nodes.  Both wounds irrigated, hemostasis was achieved and the wound closed in layers with  interrupted sutures of 3-0 Vicryl in deep dermal layer and a running subcuticular suture of Monocryl 4-0, then dressed with dermabond. The patient tolerated the procedure well and was taken to the postanesthesia care unit in stable condition. Sponge and instrument  count correct at end of procedure.

## 2020-05-07 NOTE — Discharge Instructions (Signed)
AMBULATORY SURGERY  °DISCHARGE INSTRUCTIONS ° ° °1) The drugs that you were given will stay in your system until tomorrow so for the next 24 hours you should not: ° °A) Drive an automobile °B) Make any legal decisions °C) Drink any alcoholic beverage ° ° °2) You may resume regular meals tomorrow.  Today it is better to start with liquids and gradually work up to solid foods. ° °You may eat anything you prefer, but it is better to start with liquids, then soup and crackers, and gradually work up to solid foods. ° ° °3) Please notify your doctor immediately if you have any unusual bleeding, trouble breathing, redness and pain at the surgery site, drainage, fever, or pain not relieved by medication. ° ° ° °4) Additional Instructions: ° ° ° ° ° ° ° °Please contact your physician with any problems or Same Day Surgery at [phone], Monday through Friday 6 am to 4 pm, or [facility] number at [phone].Removal, Care After °This sheet gives you information about how to care for yourself after your procedure. Your health care provider may also give you more specific instructions. If you have problems or questions, contact your health care provider. °What can I expect after the procedure? °After the procedure, it is common to have: °· Soreness. °· Bruising. °· Itching. °Follow these instructions at home: °site care °Follow instructions from your health care provider about how to take care of your site. Make sure you: °· Wash your hands with soap and water before and after you change your bandage (dressing). If soap and water are not available, use hand sanitizer. °· Leave stitches (sutures), skin glue, or adhesive strips in place. These skin closures may need to stay in place for 2 weeks or longer. If adhesive strip edges start to loosen and curl up, you may trim the loose edges. Do not remove adhesive strips completely unless your health care provider tells you to do that. °· If the area bleeds or  bruises, apply gentle pressure for 10 minutes. °· OK TO SHOWER IN 24HRS ° °Check your site every day for signs of infection. Check for: °· Redness, swelling, or pain. °· Fluid or blood. °· Warmth. °· Pus or a bad smell. ° °General instructions °· Rest and then return to your normal activities as told by your health care provider. °•  tylenol and advil as needed for discomfort.  Please alternate between the two every four hours as needed for pain.   °•  Use narcotics, if prescribed, only when tylenol and motrin is not enough to control pain. °•  325-650mg every 8hrs to max of 3000mg/24hrs (including the 325mg in every norco dose) for the tylenol.   °•  Advil up to 800mg per dose every 8hrs as needed for pain.   °· Keep all follow-up visits as told by your health care provider. This is important. °Contact a health care provider if: °· You have redness, swelling, or pain around your site. °· You have fluid or blood coming from your site. °· Your site feels warm to the touch. °· You have pus or a bad smell coming from your site. °· You have a fever. °· Your sutures, skin glue, or adhesive strips loosen or come off sooner than expected. °Get help right away if: °· You have bleeding that does not stop with pressure or a dressing. °Summary °· After the procedure, it is common to have some soreness, bruising, and itching at the site. °·   Follow instructions from your health care provider about how to take care of your site. °· Check your site every day for signs of infection. °· Contact a health care provider if you have redness, swelling, or pain around your site, or your site feels warm to the touch. °· Keep all follow-up visits as told by your health care provider. This is important. °This information is not intended to replace advice given to you by your health care provider. Make sure you discuss any questions you have with your health care provider. °Document Released: 10/30/2015 Document Revised: 04/02/2018 Document  Reviewed: 04/02/2018 °[employer] Interactive Patient Education © 2019 [employer] ° ° ° °

## 2020-05-08 ENCOUNTER — Other Ambulatory Visit: Payer: Self-pay | Admitting: Surgery

## 2020-05-08 ENCOUNTER — Encounter: Payer: Self-pay | Admitting: Surgery

## 2020-05-08 DIAGNOSIS — Z170 Estrogen receptor positive status [ER+]: Secondary | ICD-10-CM

## 2020-05-08 DIAGNOSIS — C50411 Malignant neoplasm of upper-outer quadrant of right female breast: Secondary | ICD-10-CM

## 2020-05-08 MED ORDER — OXYCODONE-ACETAMINOPHEN 5-325 MG PO TABS: 1.0000 | ORAL_TABLET | Freq: Three times a day (TID) | ORAL | 0 refills | Status: DC | PRN

## 2020-05-11 ENCOUNTER — Other Ambulatory Visit: Payer: Self-pay | Admitting: Pathology

## 2020-05-12 ENCOUNTER — Telehealth: Payer: Self-pay | Admitting: *Deleted

## 2020-05-12 ENCOUNTER — Inpatient Hospital Stay (HOSPITAL_BASED_OUTPATIENT_CLINIC_OR_DEPARTMENT_OTHER): Payer: Managed Care, Other (non HMO) | Admitting: Oncology

## 2020-05-12 ENCOUNTER — Other Ambulatory Visit: Payer: Self-pay

## 2020-05-12 VITALS — BP 132/71 | HR 70 | Temp 97.5°F | Wt 208.0 lb

## 2020-05-12 DIAGNOSIS — Z170 Estrogen receptor positive status [ER+]: Secondary | ICD-10-CM

## 2020-05-12 DIAGNOSIS — Z800 Family history of malignant neoplasm of digestive organs: Secondary | ICD-10-CM | POA: Diagnosis not present

## 2020-05-12 DIAGNOSIS — C50411 Malignant neoplasm of upper-outer quadrant of right female breast: Secondary | ICD-10-CM | POA: Diagnosis not present

## 2020-05-12 DIAGNOSIS — F419 Anxiety disorder, unspecified: Secondary | ICD-10-CM | POA: Diagnosis not present

## 2020-05-12 DIAGNOSIS — Z9221 Personal history of antineoplastic chemotherapy: Secondary | ICD-10-CM | POA: Diagnosis not present

## 2020-05-12 DIAGNOSIS — J45909 Unspecified asthma, uncomplicated: Secondary | ICD-10-CM | POA: Diagnosis not present

## 2020-05-12 DIAGNOSIS — Z79899 Other long term (current) drug therapy: Secondary | ICD-10-CM | POA: Diagnosis not present

## 2020-05-12 DIAGNOSIS — Z8052 Family history of malignant neoplasm of bladder: Secondary | ICD-10-CM | POA: Diagnosis not present

## 2020-05-12 DIAGNOSIS — Z7189 Other specified counseling: Secondary | ICD-10-CM | POA: Diagnosis not present

## 2020-05-12 DIAGNOSIS — Z923 Personal history of irradiation: Secondary | ICD-10-CM | POA: Diagnosis not present

## 2020-05-12 DIAGNOSIS — Z803 Family history of malignant neoplasm of breast: Secondary | ICD-10-CM | POA: Diagnosis not present

## 2020-05-12 DIAGNOSIS — F329 Major depressive disorder, single episode, unspecified: Secondary | ICD-10-CM | POA: Diagnosis not present

## 2020-05-12 DIAGNOSIS — Z791 Long term (current) use of non-steroidal anti-inflammatories (NSAID): Secondary | ICD-10-CM | POA: Diagnosis not present

## 2020-05-12 DIAGNOSIS — Z8249 Family history of ischemic heart disease and other diseases of the circulatory system: Secondary | ICD-10-CM | POA: Diagnosis not present
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
## 2020-05-12 NOTE — Telephone Encounter (Signed)
Pt needs mammoprint testing and it was sent in electronically to [facility] website and the order # [id number]

## 2020-05-13 NOTE — Progress Notes (Signed)
Phoned patient prior to surgery on 05/07/20.  Requested EMLA cream prescription prior to SN biopsy.  Left message for patient today.  Per Dr. Elroy Channel note, Mammoprint sent to determine further treatment.

## 2020-05-20 ENCOUNTER — Ambulatory Visit: Payer: Self-pay | Admitting: Surgery

## 2020-05-20 ENCOUNTER — Other Ambulatory Visit: Payer: Self-pay | Admitting: Surgery

## 2020-05-20 DIAGNOSIS — C50511 Malignant neoplasm of lower-outer quadrant of right female breast: Secondary | ICD-10-CM

## 2020-05-20 DIAGNOSIS — Z170 Estrogen receptor positive status [ER+]: Secondary | ICD-10-CM

## 2020-05-20 MED ORDER — CLINDAMYCIN PHOSPHATE 900 MG/50ML IV SOLN
900.0000 mg | INTRAVENOUS | Status: AC
  Filled 2020-05-20: qty 50

## 2020-05-20 MED ORDER — GENTAMICIN SULFATE 40 MG/ML IJ SOLN
5.0000 mg/kg | INTRAVENOUS | Status: AC
  Filled 2020-05-20: qty 11.75

## 2020-05-20 NOTE — H&P (Signed)
Subjective:   CC: Malignant neoplasm of lower-outer quadrant of right breast of female, estrogen receptor positive (CMS-HCC) [C50.511, Z17.0] POSTOP  HPI:  [name] is a 39 y.o. female who is here for followup from above.  Increasing swelling and pain in axilla.  Minor TTP at lumpectomy site.     Current Medications: has a current medication list which includes the following prescription(s): acetaminophen, albuterol, alprazolam, epinephrine, ibuprofen, ondansetron, promethazine, sertraline, stool softener, and trazodone.  Allergies:      Allergies  Allergen Reactions  . Venom-Honey Bee Anaphylaxis  . Latex Unknown  . Penicillin Unknown  . Shellfish Containing Products Hives    ROS: General: Denies weight loss, weight gain, fatigue, fevers, chills, and night sweats. Heart: Denies chest pain, palpitations, racing heart, irregular heartbeat, leg pain or swelling, and decreased activity tolerance. Respiratory: Denies breathing difficulty, shortness of breath, wheezing, cough, and sputum. GI: Denies change in appetite, heartburn, nausea, vomiting, constipation, diarrhea, and blood in stool. GU: Denies difficulty urinating, pain with urinating, urgency, frequency, blood in urine    Objective:   BP (!) 144/94   Pulse 102   Ht 170.2 cm (_0 )   Wt 95.3 kg (210 lb)   BMI 32.89 kg/m   Constitutional :  alert, appears stated age, cooperative and no distress  Musculoskeletal: Steady gait and movement  Skin: Cool and moist  Psychiatric: Normal affect, non-agitated, not confused  Breast: Chaperone present for exam.  Axilla with large, tender, seroma.  No overlying skin changes to indicate infection.  Incisions at both sites healed.  Breast wound with minimal swelling and TTP.    LABS:  Reason for Addendum #1: Breast Biomarker Results   Specimen Submitted:  A. Breast mass,right  B. Sentinel lymph node 1, right axillary  C. Sentinel lymph node 2, right axillary    Clinical History: C50.511 malignant neoplasm of lower outer quadrant of  RT breast in female, Z17.0 estrogen receptor positive       DIAGNOSIS:  A. BREAST MASS, RIGHT; PARTIAL MASTECTOMY:  - INVASIVE MAMMARY CARCINOMA.  - DUCTAL CARCINOMA IN SITU.  - SEE CANCER SUMMARY BELOW.  - TWO INTRAMAMMARY LYMPH NODES NEGATIVE FOR MALIGNANCY (0/2).  - PRIOR BIOPSY SITE CHANGE WITH METALLIC MARKER.  - RF ID TAG IN PLACE.   B. SENTINEL LYMPH NODE, 1 RIGHT AXILLARY; EXCISION:  - FOUR FAT-REPLACED LYMPH NODES NEGATIVE FOR MALIGNANCY (0/4).   C. SENTINEL LYMPH NODE, 2 RIGHT AXILLARY; EXCISION:  - SEVEN FAT-REPLACED LYMPH NODES NEGATIVE FOR MALIGNANCY (0/7).   CANCERCASE SUMMARY: INVASIVE CARCINOMA OF THE BREAST  Procedure: Partial mastectomy  Specimen Laterality: Right  Tumor Size: 38 mm  Histologic Type: Invasive carcinoma of no special type  Histologic Grade (Nottingham Histologic Score)    Glandular (Acinar)/Tubular Differentiation: 3        Nuclear Pleomorphism: 2-3        Mitotic Rate: 3        Overall Grade: 3  Ductal Carcinoma In Situ (DCIS): Present, nuclear grade 2 with focal  comedonecrosis  Margins:    Invasive Carcinoma Margins: Positive for invasive carcinoma      Anterior, unifocal      Lateral, unifocal    DCIS Margins: Uninvolved by DCIS         Distance from closest margin: 0.15 mm         Specify closest margin: Posterior  Regional LymphNodes: Uninvolved by tumor cells            Total Number  of Lymph Nodes Examined: 13            Number of Sentinel Nodes Examined: 11  Treatment Effect in the Breast: Not identified  Lymphovascular Invasion: Not identified  Pathologic Stage Classification (pTNM, AJCC 8th Edition): pT2 pN0  Breast Biomarker Testing Performed on Previous Biopsy: [id number]    Estrogen Receptor (ER) Status: Positive    Progesterone Receptor (PgR) Status:  Positive    HER2 (by immunohistochemistry): Equivocal (Score 2+)    HER-2/CEP17 FISH: Negative (Group 4)   Comment:  The invasive mammary carcinoma demonstrates focal mucinous carcinoma  differentiation involving <10% of the tumor. Invasive mammary carcinoma  focally involves the anterior and lateral margins and is 0.15 mm from  the posterior margin.   GROSS DESCRIPTION:  Intraoperative Consultation:   Labeled: Right breast mass   Received: Fresh   Specimen: Partial mastectomy   Pathologic evaluation performed: Gross margin evaluation   Diagnosis: IOC: Right breast: Mass present with clip; margins  grossly clear, RFID present; Note: Grid not visible on specimen  radiograph   Communicated to: Called to Dr. [name] at 11:23 AM on 05/07/2020 Quay Burow M.D.   Tissuesubmitted: None    A. Labeled: Right breast mass  Received: Fresh  Specimen radiograph image(s) available for review  Radiographic findings: A clip and RFID tag are present.  Time in fixative: Collected at 11:07 AM on 05/07/2020 and placed in  formalinat 11:23 AM on 05/07/2020  Cold ischemic time: Less than 30 minutes  Total fixation time: 10 hours  Type of procedure: Partial mastectomy  Location / laterality of specimen: Right breast  Orientation of specimen: The specimen is received inked and is oriented  with clear plastic clips with the following designations: S, I, L, M, A,  and P.  Inking:  Anterior = green  Inferior = blue  Lateral = orange  Medial = yellow  Posterior = black  Superior = red  Size of specimen: 9.5 (superior to inferior) x 4.7 (medial to lateral) x  3.5 (anterior to posterior) cm  Skin: None grossly appreciated.   Biopsy site: A biopsy site is present with a Venus clip.   Number of discrete masses: 1  Size of mass(es): 3.8 x 1.9 x 1.7 cm  Description of mass(es): The mass iswhite-pink, firm, and stellate.  There are scattered areas of abutting firm fibrous  tissue, especially at  the anterior aspect.  Distance between masses/clips: The clip is located within the mass. The  radiotracer is abutting the mass.  Margins: Anterior =0.5 cm, posterior =0.5 cm, lateral =0.4 cm, medial  =1.1 cm, superior =1.1 cm, inferior =1.1 cm  Description of remainder of tissue: Remainder of the specimen is  comprised of yellow lobulated adipose tissue admixed with tan-white firm  fibrous tissue. The fat to fibrous tissue ratio is 70:30.   Block summary (approximately 50% of the mass and 30% of the total  specimen are submitted):  1 - 5 - superior margin, perpendicularly sectioned  6 - 10 - representative sections of mass (1/cm)    6 - closest approach to anterior margin    7 - closest approach to anterior, lateral, and posterior margins    8 - closest approach to anterior and lateral margins with clip site    9 - closest approach to anterior and lateral margin    10 - closest approach to anterior margin  11 - additional closest posterior margin  12 - closest medial margins  13 - closest  superior margin  14 - closest inferior margin  15 - 16 - inferior margin, perpendicularly sectioned   B. Labeled: Right axillary sentinel lymph node #1  Received: Formalin  Tissue fragment(s): 1  Size: 4.6 x 3.7 x 2.2 cm  Description: Received is a portion of yellow lobulated adipose tissue.  There are 4 embedded lymph node candidates ranging from 0.2 to 2.8 cm in  greatest dimension. Thelymph node candidates are submitted entirely as  follows:  1 - 1 lymph node candidate  2 - 1 lymph node candidate, trisected  3 - 7 - 1 lymph node candidate, serially sectioned  8 - 9 - 1 lymph node candidate, bisected   C. Labeled: Right axillary sentinel lymph node #2  Received: Formalin  Tissue fragment(s): 1  Size: 6.8 x 3.4 x 1.9 cm  Description: Received is a portion of yellow lobulated adipose tissue.  There are 8 lymph node candidates ranging from 0.4 to  2.4 cm in greatest  dimension. The lymph node candidates are submitted entirely as follows:  1 - 2 lymph node candidates  2 - 3 - 2 lymph node candidates, bisected and differentially inked (4  total)  4 - 1 lymph node candidate, bisected  5 - 6 - 1 lymph node candidate, bisected    Final Diagnosis performed by Quay Burow, MD.  Electronically signed  05/11/2020 4:56:58PM  The electronic signature indicates that the named Attending Pathologist  has evaluated the specimen  Technical component performed at [facility], [address], [place],  [place] [address] Lab: [phone] Dir: Rush Farmer, MD, MMM  Professional component performed at [facility], [facility], [place], [place], [place] [address] Lab: [phone]  Dir: Dellia Nims. Rubinas, MD   ADDENDUM:  Per clinician request HER2 IHC was repeated on this specimen with the  following results:  HER2 (by immunohistochemistry): EQUIVOCAL (Score 2+)            Percentage of cells with uniform intense complete  membrane staining: 0%  HER2 FISH will be performed and reported in an addendum.   Cold Ischemia and Fixation Times: Meet requirements specified in latest  version of the ASCO/CAP guidelines  Testing Performed on Block Number(s): A 9   METHODS  Fixative: Formalin  HER2 (by IHC): FDA cleared ([place]) Primary Antibody: 4B5 (PATHWAY)  Immunohistochemistry controls worked appropriately. Slides were prepared  by [facility], [place], [place], and interpreted by Quay Burow,  MD.   This test was developed and its performance characteristics determined  by [facility]. It has not been cleared or approved by the [place] Food and Drug  Administration. The FDA does not require this test to go through  premarket FDA review. This test is used for clinical purposes. It should  not be regarded as investigational or for research. This laboratory is  certified under the Clinical Laboratory Improvement  Amendments (CLIA) as  qualified to perform high complexity clinical laboratory testing.   Addendum #1 performed by Quay Burow, MD.  Electronically signed  05/18/2020 3:31:36PM  The electronic signature indicates that the named Attending Pathologist  has evaluated the specimen  Technical component performed at [facility], [address], [place],  [place] [address] Lab: [phone] Dir: Rush Farmer, MD, MMM  Professional component performed at [facility], [facility], [place], [place], [place] [address] Lab: [phone]  Dir: Dellia Nims. Rubinas, MD  RADS: N/A  Assessment:     Seroma of breast, right Malignant neoplasm of lower-outer quadrant of right  breast of female, estrogen receptor positive (CMS-HCC) [C50.511, Z17.0], s/p SLNB, partial mastectomy. Positive margins, negative nodes,   Plan:    2. Pending onc f/u to decide whether or not to proceed with chemo.  Will schedule for re-excision of positive margins, possible port placement.   Discussed the risk of re-excision including recurrence, chronic pain, post-op infxn, poor/delayed wound healing, poor cosmesis, seroma, hematoma formation, and possible re-operation to address said risks. Typical post-op recovery time and possbility of activity restrictions were also discussed.  Alternatives include no intervention.  Benefits include curative excision.   Risk alternative benefits discussed of port placement.  Risks include bleeding, infection, dislocation, migration, malfunction, unsuccessful placement, pneumothorax, and additional procedures to address that risks.  Benefits include initiation of chemotherapy.  Alternative includes non-IV infusion therapy.  We discussed the need for the port to infuse IV chemotherapy agents, and how the port can be a temporary and/or permanent option depending on patient preference after completion of chemotherapy.  Port will be okay to use if it is placed.  Patient  verbalized understanding and all questions and concerns addressed. Procedures above will be scheduled after oncology f/u.

## 2020-05-20 NOTE — H&P (View-Only) (Signed)
Subjective:   CC: Malignant neoplasm of lower-outer quadrant of right breast of female, estrogen receptor positive (CMS-HCC) [C50.511, Z17.0] POSTOP  HPI:  [name] is a 39 y.o. female who is here for followup from above.  Increasing swelling and pain in axilla.  Minor TTP at lumpectomy site.     Current Medications: has a current medication list which includes the following prescription(s): acetaminophen, albuterol, alprazolam, epinephrine, ibuprofen, ondansetron, promethazine, sertraline, stool softener, and trazodone.  Allergies:      Allergies  Allergen Reactions  . Venom-Honey Bee Anaphylaxis  . Latex Unknown  . Penicillin Unknown  . Shellfish Containing Products Hives    ROS: General: Denies weight loss, weight gain, fatigue, fevers, chills, and night sweats. Heart: Denies chest pain, palpitations, racing heart, irregular heartbeat, leg pain or swelling, and decreased activity tolerance. Respiratory: Denies breathing difficulty, shortness of breath, wheezing, cough, and sputum. GI: Denies change in appetite, heartburn, nausea, vomiting, constipation, diarrhea, and blood in stool. GU: Denies difficulty urinating, pain with urinating, urgency, frequency, blood in urine    Objective:   BP (!) 144/94   Pulse 102   Ht 170.2 cm (_0 )   Wt 95.3 kg (210 lb)   BMI 32.89 kg/m   Constitutional :  alert, appears stated age, cooperative and no distress  Musculoskeletal: Steady gait and movement  Skin: Cool and moist  Psychiatric: Normal affect, non-agitated, not confused  Breast: Chaperone present for exam.  Axilla with large, tender, seroma.  No overlying skin changes to indicate infection.  Incisions at both sites healed.  Breast wound with minimal swelling and TTP.    LABS:  Reason for Addendum #1: Breast Biomarker Results   Specimen Submitted:  A. Breast mass,right  B. Sentinel lymph node 1, right axillary  C. Sentinel lymph node 2, right axillary    Clinical History: C50.511 malignant neoplasm of lower outer quadrant of  RT breast in female, Z17.0 estrogen receptor positive       DIAGNOSIS:  A. BREAST MASS, RIGHT; PARTIAL MASTECTOMY:  - INVASIVE MAMMARY CARCINOMA.  - DUCTAL CARCINOMA IN SITU.  - SEE CANCER SUMMARY BELOW.  - TWO INTRAMAMMARY LYMPH NODES NEGATIVE FOR MALIGNANCY (0/2).  - PRIOR BIOPSY SITE CHANGE WITH METALLIC MARKER.  - RF ID TAG IN PLACE.   B. SENTINEL LYMPH NODE, 1 RIGHT AXILLARY; EXCISION:  - FOUR FAT-REPLACED LYMPH NODES NEGATIVE FOR MALIGNANCY (0/4).   C. SENTINEL LYMPH NODE, 2 RIGHT AXILLARY; EXCISION:  - SEVEN FAT-REPLACED LYMPH NODES NEGATIVE FOR MALIGNANCY (0/7).   CANCERCASE SUMMARY: INVASIVE CARCINOMA OF THE BREAST  Procedure: Partial mastectomy  Specimen Laterality: Right  Tumor Size: 38 mm  Histologic Type: Invasive carcinoma of no special type  Histologic Grade (Nottingham Histologic Score)    Glandular (Acinar)/Tubular Differentiation: 3        Nuclear Pleomorphism: 2-3        Mitotic Rate: 3        Overall Grade: 3  Ductal Carcinoma In Situ (DCIS): Present, nuclear grade 2 with focal  comedonecrosis  Margins:    Invasive Carcinoma Margins: Positive for invasive carcinoma      Anterior, unifocal      Lateral, unifocal    DCIS Margins: Uninvolved by DCIS         Distance from closest margin: 0.15 mm         Specify closest margin: Posterior  Regional LymphNodes: Uninvolved by tumor cells            Total Number  of Lymph Nodes Examined: 13            Number of Sentinel Nodes Examined: 11  Treatment Effect in the Breast: Not identified  Lymphovascular Invasion: Not identified  Pathologic Stage Classification (pTNM, AJCC 8th Edition): pT2 pN0  Breast Biomarker Testing Performed on Previous Biopsy: [id number]    Estrogen Receptor (ER) Status: Positive    Progesterone Receptor (PgR) Status:  Positive    HER2 (by immunohistochemistry): Equivocal (Score 2+)    HER-2/CEP17 FISH: Negative (Group 4)   Comment:  The invasive mammary carcinoma demonstrates focal mucinous carcinoma  differentiation involving <10% of the tumor. Invasive mammary carcinoma  focally involves the anterior and lateral margins and is 0.15 mm from  the posterior margin.   GROSS DESCRIPTION:  Intraoperative Consultation:   Labeled: Right breast mass   Received: Fresh   Specimen: Partial mastectomy   Pathologic evaluation performed: Gross margin evaluation   Diagnosis: IOC: Right breast: Mass present with clip; margins  grossly clear, RFID present; Note: Grid not visible on specimen  radiograph   Communicated to: Called to Dr. [name] at 11:23 AM on 05/07/2020 Quay Burow M.D.   Tissuesubmitted: None    A. Labeled: Right breast mass  Received: Fresh  Specimen radiograph image(s) available for review  Radiographic findings: A clip and RFID tag are present.  Time in fixative: Collected at 11:07 AM on 05/07/2020 and placed in  formalinat 11:23 AM on 05/07/2020  Cold ischemic time: Less than 30 minutes  Total fixation time: 10 hours  Type of procedure: Partial mastectomy  Location / laterality of specimen: Right breast  Orientation of specimen: The specimen is received inked and is oriented  with clear plastic clips with the following designations: S, I, L, M, A,  and P.  Inking:  Anterior = green  Inferior = blue  Lateral = orange  Medial = yellow  Posterior = black  Superior = red  Size of specimen: 9.5 (superior to inferior) x 4.7 (medial to lateral) x  3.5 (anterior to posterior) cm  Skin: None grossly appreciated.   Biopsy site: A biopsy site is present with a Venus clip.   Number of discrete masses: 1  Size of mass(es): 3.8 x 1.9 x 1.7 cm  Description of mass(es): The mass iswhite-pink, firm, and stellate.  There are scattered areas of abutting firm fibrous  tissue, especially at  the anterior aspect.  Distance between masses/clips: The clip is located within the mass. The  radiotracer is abutting the mass.  Margins: Anterior =0.5 cm, posterior =0.5 cm, lateral =0.4 cm, medial  =1.1 cm, superior =1.1 cm, inferior =1.1 cm  Description of remainder of tissue: Remainder of the specimen is  comprised of yellow lobulated adipose tissue admixed with tan-white firm  fibrous tissue. The fat to fibrous tissue ratio is 70:30.   Block summary (approximately 50% of the mass and 30% of the total  specimen are submitted):  1 - 5 - superior margin, perpendicularly sectioned  6 - 10 - representative sections of mass (1/cm)    6 - closest approach to anterior margin    7 - closest approach to anterior, lateral, and posterior margins    8 - closest approach to anterior and lateral margins with clip site    9 - closest approach to anterior and lateral margin    10 - closest approach to anterior margin  11 - additional closest posterior margin  12 - closest medial margins  13 - closest  superior margin  14 - closest inferior margin  15 - 16 - inferior margin, perpendicularly sectioned   B. Labeled: Right axillary sentinel lymph node #1  Received: Formalin  Tissue fragment(s): 1  Size: 4.6 x 3.7 x 2.2 cm  Description: Received is a portion of yellow lobulated adipose tissue.  There are 4 embedded lymph node candidates ranging from 0.2 to 2.8 cm in  greatest dimension. Thelymph node candidates are submitted entirely as  follows:  1 - 1 lymph node candidate  2 - 1 lymph node candidate, trisected  3 - 7 - 1 lymph node candidate, serially sectioned  8 - 9 - 1 lymph node candidate, bisected   C. Labeled: Right axillary sentinel lymph node #2  Received: Formalin  Tissue fragment(s): 1  Size: 6.8 x 3.4 x 1.9 cm  Description: Received is a portion of yellow lobulated adipose tissue.  There are 8 lymph node candidates ranging from 0.4 to  2.4 cm in greatest  dimension. The lymph node candidates are submitted entirely as follows:  1 - 2 lymph node candidates  2 - 3 - 2 lymph node candidates, bisected and differentially inked (4  total)  4 - 1 lymph node candidate, bisected  5 - 6 - 1 lymph node candidate, bisected    Final Diagnosis performed by Quay Burow, MD.  Electronically signed  05/11/2020 4:56:58PM  The electronic signature indicates that the named Attending Pathologist  has evaluated the specimen  Technical component performed at [facility], [address], [place],  [place] [address] Lab: [phone] Dir: Rush Farmer, MD, MMM  Professional component performed at [facility], [facility], [place], [place], [place] [address] Lab: [phone]  Dir: Dellia Nims. Rubinas, MD   ADDENDUM:  Per clinician request HER2 IHC was repeated on this specimen with the  following results:  HER2 (by immunohistochemistry): EQUIVOCAL (Score 2+)            Percentage of cells with uniform intense complete  membrane staining: 0%  HER2 FISH will be performed and reported in an addendum.   Cold Ischemia and Fixation Times: Meet requirements specified in latest  version of the ASCO/CAP guidelines  Testing Performed on Block Number(s): A 9   METHODS  Fixative: Formalin  HER2 (by IHC): FDA cleared ([place]) Primary Antibody: 4B5 ([facility]WAY)  Immunohistochemistry controls worked appropriately. Slides were prepared  by [facility], [place], [place], and interpreted by Quay Burow,  MD.   This test was developed and its performance characteristics determined  by [facility]. It has not been cleared or approved by the [place] Food and Drug  Administration. The FDA does not require this test to go through  premarket FDA review. This test is used for clinical purposes. It should  not be regarded as investigational or for research. This laboratory is  certified under the Clinical Laboratory Improvement  Amendments (CLIA) as  qualified to perform high complexity clinical laboratory testing.   Addendum #1 performed by Quay Burow, MD.  Electronically signed  05/18/2020 3:31:36PM  The electronic signature indicates that the named Attending Pathologist  has evaluated the specimen  Technical component performed at [facility], [address], [place],  [place] [address] Lab: [phone] Dir: Rush Farmer, MD, MMM  Professional component performed at [facility], [facility], [place], [place], [place] [address] Lab: [phone]  Dir: Dellia Nims. Rubinas, MD  RADS: N/A  Assessment:     Seroma of breast, right Malignant neoplasm of lower-outer quadrant of right  breast of female, estrogen receptor positive (CMS-HCC) [C50.511, Z17.0], s/p SLNB, partial mastectomy. Positive margins, negative nodes,   Plan:    2. Pending onc f/u to decide whether or not to proceed with chemo.  Will schedule for re-excision of positive margins, possible port placement.   Discussed the risk of re-excision including recurrence, chronic pain, post-op infxn, poor/delayed wound healing, poor cosmesis, seroma, hematoma formation, and possible re-operation to address said risks. Typical post-op recovery time and possbility of activity restrictions were also discussed.  Alternatives include no intervention.  Benefits include curative excision.   Risk alternative benefits discussed of port placement.  Risks include bleeding, infection, dislocation, migration, malfunction, unsuccessful placement, pneumothorax, and additional procedures to address that risks.  Benefits include initiation of chemotherapy.  Alternative includes non-IV infusion therapy.  We discussed the need for the port to infuse IV chemotherapy agents, and how the port can be a temporary and/or permanent option depending on patient preference after completion of chemotherapy.  Port will be okay to use if it is placed.  Patient  verbalized understanding and all questions and concerns addressed. Procedures above will be scheduled after oncology f/u.

## 2020-05-21 ENCOUNTER — Encounter: Payer: Self-pay | Admitting: Oncology

## 2020-05-23 ENCOUNTER — Other Ambulatory Visit: Payer: Self-pay

## 2020-05-23 ENCOUNTER — Emergency Department
Admission: EM | Admit: 2020-05-23 | Discharge: 2020-05-23 | Disposition: A | Discharge status: Home / Self Care | Payer: Managed Care, Other (non HMO) | Attending: Emergency Medicine | Admitting: Emergency Medicine

## 2020-05-23 ENCOUNTER — Encounter: Payer: Self-pay | Admitting: Emergency Medicine

## 2020-05-23 DIAGNOSIS — C50211 Malignant neoplasm of upper-inner quadrant of right female breast: Secondary | ICD-10-CM | POA: Insufficient documentation

## 2020-05-23 DIAGNOSIS — J45909 Unspecified asthma, uncomplicated: Secondary | ICD-10-CM | POA: Diagnosis not present

## 2020-05-23 DIAGNOSIS — N6489 Other specified disorders of breast: Secondary | ICD-10-CM | POA: Insufficient documentation

## 2020-05-23 DIAGNOSIS — Z91040 Latex allergy status: Secondary | ICD-10-CM | POA: Diagnosis not present

## 2020-05-23 LAB — COMPREHENSIVE METABOLIC PANEL
ALT: 40 U/L (ref 0–44)
AST: 44 U/L — ABNORMAL HIGH (ref 15–41)
Albumin: 4.2 g/dL (ref 3.5–5.0)
Alkaline Phosphatase: 62 U/L (ref 38–126)
Anion gap: 9 (ref 5–15)
BUN: 9 mg/dL (ref 6–20)
CO2: 22 mmol/L (ref 22–32)
Calcium: 9.3 mg/dL (ref 8.9–10.3)
Chloride: 106 mmol/L (ref 98–111)
Creatinine, Ser: 0.6 mg/dL (ref 0.44–1.00)
GFR calc Af Amer: >60 mL/min (ref >60)
GFR calc non Af Amer: >60 mL/min (ref >60)
Glucose, Bld: 105 mg/dL — ABNORMAL HIGH (ref 70–99)
Potassium: 3.8 mmol/L (ref 3.5–5.1)
Sodium: 137 mmol/L (ref 135–145)
Total Bilirubin: 0.8 mg/dL (ref 0.3–1.2)
Total Protein: 7.2 g/dL (ref 6.5–8.1)

## 2020-05-23 LAB — CBC WITH DIFFERENTIAL/PLATELET
Abs Immature Granulocytes: 0.01 10*3/uL (ref 0.00–0.07)
Basophils Absolute: 0.1 10*3/uL (ref 0.0–0.1)
Basophils Relative: 1 %
Eosinophils Absolute: 0.3 10*3/uL (ref 0.0–0.5)
Eosinophils Relative: 5 %
HCT: 36.1 % (ref 36.0–46.0)
Hemoglobin: 12.7 g/dL (ref 12.0–15.0)
Immature Granulocytes: 0 %
Lymphocytes Relative: 44 %
Lymphs Abs: 2.7 10*3/uL (ref 0.7–4.0)
MCH: 29.8 pg (ref 26.0–34.0)
MCHC: 35.2 g/dL (ref 30.0–36.0)
MCV: 84.7 fL (ref 80.0–100.0)
Monocytes Absolute: 0.5 10*3/uL (ref 0.1–1.0)
Monocytes Relative: 9 %
Neutro Abs: 2.6 10*3/uL (ref 1.7–7.7)
Neutrophils Relative %: 41 %
Platelets: 280 10*3/uL (ref 150–400)
RBC: 4.26 MIL/uL (ref 3.87–5.11)
RDW: 12.4 % (ref 11.5–15.5)
WBC: 6.2 10*3/uL (ref 4.0–10.5)
nRBC: 0 % (ref 0.0–0.2)

## 2020-05-23 MED ORDER — OXYCODONE-ACETAMINOPHEN 5-325 MG PO TABS: 1.0000 | ORAL_TABLET | Freq: Three times a day (TID) | ORAL | 0 refills | Status: DC | PRN
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
## 2020-05-23 NOTE — ED Triage Notes (Signed)
Pt presents to ED via POV with c/o abscess to R axillary, pt states had abscess drained 8/4 at postop visit for having lymph nodes removed.

## 2020-05-23 NOTE — Discharge Instructions (Signed)
Please follow up with your surgeon if the fluid begins to recollect.  Monitor for any sign or concern for infection.   Return to the ER for symptoms that change or worsen if you are unable to see PCP or surgeon.

## 2020-05-25 ENCOUNTER — Other Ambulatory Visit: Payer: Self-pay

## 2020-05-25 ENCOUNTER — Encounter: Payer: Self-pay | Admitting: Oncology

## 2020-05-25 ENCOUNTER — Encounter
Admission: RE | Admit: 2020-05-25 | Discharge: 2020-05-25 | Disposition: A | Discharge status: Home / Self Care | Payer: Medicaid Other | Source: Ambulatory Visit | Attending: Surgery | Admitting: Surgery

## 2020-05-25 DIAGNOSIS — Z01818 Encounter for other preprocedural examination: Secondary | ICD-10-CM | POA: Diagnosis present

## 2020-05-25 HISTORY — DX: Other specified diseases of pancreas: K86.89

## 2020-05-25 HISTORY — DX: Insomnia, unspecified: G47.00

## 2020-05-25 LAB — SURGICAL PATHOLOGY

## 2020-05-25 NOTE — Patient Instructions (Addendum)
INSTRUCTIONS FOR SURGERY     Your surgery is scheduled for:   Thursday, AUGUST 12TH     To find out your arrival time for the day of surgery,          please call [phone] between 1 pm and 3 pm on :  Wednesday, AUGUST 11TH     When you arrive for surgery, report to the [facility].       Do NOT stop on the first floor to register.    REMEMBER: Instructions that are not followed completely may result in serious medical risk,  up to and including death, or upon the discretion of your surgeon and anesthesiologist,            your surgery may need to be rescheduled.  __X__ 1. Do not eat food after midnight the night before your procedure.                    No gum, candy, lozenger, tic tacs, tums or hard candies.                  ABSOLUTELY NOTHING SOLID IN YOUR MOUTH AFTER MIDNIGHT                    You may drink unlimited clear liquids up to 2 hours before you are scheduled to arrive for surgery.                   Do not drink anything within those 2 hours unless you need to take medicine, then take the                   smallest amount you need.  Clear liquids include:  water, apple juice without pulp,                   any flavor Gatorade, Black coffee, black tea.  Sugar may be added but no dairy/ honey /lemon.                        Broth and jello is not considered a clear liquid.  __x__  2. On the morning of surgery, please brush your teeth with toothpaste and water. You may rinse with                  mouthwash if you wish but DO NOT SWALLOW TOOTHPASTE OR MOUTHWASH  __X___3. NO alcohol for 24 hours before or after surgery.  __x___ 4.  Do NOT smoke or use e-cigarettes for 24 HOURS PRIOR TO SURGERY.                      DO NOT Use any chewable tobacco products for at least 6 hours prior to surgery.  __x___ 5. If you start any new medication after this appointment and prior to surgery, please                    Bring it with you on the day of surgery.  ___x__ 6. Notify your doctor if there is any change in  your medical condition, such as fever,                   infection, vomitting, diarrhea or any open sores.  __x___ 7.  USE the CHG SOAP as instructed, the night before surgery and the day of surgery.                   Once you have washed with this soap, do NOT use any of the following: Powders, perfumes                    or lotions. Please do not wear make up, hairpins, clips or nail polish.                    You MAY NOT wear deodorant.  Women need to shave at least 48 hours prior to surgery.                   DO NOT wear ANY jewelry on the day of surgery. If there are rings that are too tight to                    remove easily, please address this prior to the surgery day. Piercings need to be removed.                                                                     NO METAL ON YOUR BODY.                    Do NOT bring any valuables.  If you came to Pre-Admit testing then you will not need license,                     insurance card or credit card.  If you will be staying overnight, please either leave your things in                     the car or have your family be responsible for these items.                     [facility] IS NOT RESPONSIBLE FOR BELONGINGS OR VALUABLES.  ___X__ 8. DO NOT wear contact lenses on surgery day.  You may not have dentures,                     Hearing aides, contacts or glasses in the operating room. These items can be                    Placed in the Recovery Room to receive immediately after surgery.  __x___ 9. IF YOU ARE SCHEDULED TO GO HOME ON THE SAME DAY, YOU MUST                   Have someone to drive you home and to stay with you  for the first 24 hours.                    Have an arrangement prior to arriving on surgery day.  ___x__ 10. Take the following medications on the morning of  surgery with a sip of water:                               1. XANAX, if needed                     2. PERCOCET, if needed                     3. ALBUTEROL INIHALER  _____ 11.  Follow any instructions provided to you by your surgeon.                        Such as enema, clear liquid bowel prep  __X__  12. STOP ALL ASPIRIN PRODUCTS TODAY, 05/25/20                       THIS INCLUDES BC POWDERS / GOODIES POWDER  __x___ 13. STOP Anti-inflammatories as of: TODAY, 05/25/20.                      This includes IBUPROFEN / MOTRIN / ADVIL / ALEVE/ NAPROXYN                    YOU MAY TAKE TYLENOL ANY TIME PRIOR TO SURGERY.  __X____18. Wear clean and comfortable clothing to the hospital.                        MAKE SURE TO [place].  WEAR A SPORTS BRA THAT YOU CAN KEEP ON AFTER SURGERY.   (preferrably a sports bra)

## 2020-05-25 NOTE — Pre-Procedure Instructions (Signed)
Spoke with patient via phone for her preop interview.  She stated that she was concerned as her right axillary lymph node region was still filling up with fluid and is large and very uncomfortable.  She has had this site drained x 2 in last week for yellow fluid.  [name] states that she can feel fluid up into her chest and the discomfort goes around to the back of her arm.  She wondered if a drain would be appropriate for this situation.

## 2020-05-26 ENCOUNTER — Other Ambulatory Visit
Admission: RE | Admit: 2020-05-26 | Discharge: 2020-05-26 | Disposition: A | Discharge status: Home / Self Care | Payer: Managed Care, Other (non HMO) | Source: Ambulatory Visit | Attending: Surgery | Admitting: Surgery

## 2020-05-26 ENCOUNTER — Inpatient Hospital Stay (HOSPITAL_BASED_OUTPATIENT_CLINIC_OR_DEPARTMENT_OTHER): Payer: Managed Care, Other (non HMO) | Admitting: Oncology

## 2020-05-26 ENCOUNTER — Encounter: Payer: Self-pay | Admitting: Oncology

## 2020-05-26 ENCOUNTER — Inpatient Hospital Stay
Payer: Managed Care, Other (non HMO) | Attending: Hospice and Palliative Medicine | Admitting: Hospice and Palliative Medicine

## 2020-05-26 DIAGNOSIS — Z5189 Encounter for other specified aftercare: Secondary | ICD-10-CM | POA: Insufficient documentation

## 2020-05-26 DIAGNOSIS — Z7189 Other specified counseling: Secondary | ICD-10-CM | POA: Diagnosis not present

## 2020-05-26 DIAGNOSIS — C50911 Malignant neoplasm of unspecified site of right female breast: Secondary | ICD-10-CM | POA: Insufficient documentation

## 2020-05-26 DIAGNOSIS — Z170 Estrogen receptor positive status [ER+]: Secondary | ICD-10-CM | POA: Diagnosis not present

## 2020-05-26 DIAGNOSIS — C50411 Malignant neoplasm of upper-outer quadrant of right female breast: Secondary | ICD-10-CM | POA: Diagnosis not present

## 2020-05-26 DIAGNOSIS — F329 Major depressive disorder, single episode, unspecified: Secondary | ICD-10-CM

## 2020-05-26 DIAGNOSIS — Z01812 Encounter for preprocedural laboratory examination: Secondary | ICD-10-CM | POA: Diagnosis not present

## 2020-05-26 DIAGNOSIS — F419 Anxiety disorder, unspecified: Secondary | ICD-10-CM

## 2020-05-26 DIAGNOSIS — Z515 Encounter for palliative care: Secondary | ICD-10-CM | POA: Diagnosis not present

## 2020-05-26 DIAGNOSIS — Z20822 Contact with and (suspected) exposure to covid-19: Secondary | ICD-10-CM | POA: Diagnosis not present

## 2020-05-26 DIAGNOSIS — Z5111 Encounter for antineoplastic chemotherapy: Secondary | ICD-10-CM | POA: Insufficient documentation

## 2020-05-26 DIAGNOSIS — F32A Depression, unspecified: Secondary | ICD-10-CM

## 2020-05-26 LAB — SARS CORONAVIRUS 2 (TAT 6-24 HRS): SARS Coronavirus 2: NEGATIVE

## 2020-05-26 MED ORDER — ALPRAZOLAM 0.5 MG PO TABS
0.5000 mg | ORAL_TABLET | Freq: Two times a day (BID) | ORAL | 0 refills | Status: DC | PRN
Start: 2020-05-26 — End: 2020-06-26

## 2020-05-26 MED ORDER — SERTRALINE HCL 100 MG PO TABS
100.0000 mg | ORAL_TABLET | Freq: Every day | ORAL | 3 refills | Status: DC
Start: 2020-05-26 — End: 2020-09-25

## 2020-05-26 NOTE — Progress Notes (Signed)
Pt was told today that mammaprint was positive and she will need chemo. I called her after the md visit to enusure all electronic info is correct in chart. Pt. Works at [employer] and she has not worked there for a year so she thinks that she will not meet criteria to get FMLA. She is speaking to someone for short term disability. Her biggest issue is loosing her hair with chemo and wants to have info and cost about cooling cap. I told her that when she gets chemo education she will get info about it but if I can get the packet , I can let her know. Dr. Janese Banks has spoke with Dr. Launa Flight and she will get port placed when he does the reexcision for positive  Margins. Pt to have this on Thursday of this week. And will potentially start chemo 2 thursdays from this week .

## 2020-05-26 NOTE — Progress Notes (Signed)
Virtual Visit via Video Note  I connected with [name] on 05/26/20 at 10:30 AM EDT by video and verified that I am speaking with the correct person using two identifiers.   I discussed the limitations, risks, security and privacy concerns of performing an evaluation and management service by telephone and the availability of in person appointments. I also discussed with the patient that there may be a patient responsible charge related to this service. The patient expressed understanding and agreed to proceed.   History of Present Illness: [name] is a 39 year old postmenopausal woman who has had a palpable right breast mass for about a year. She underwent MRI abdomen May 2021 which showed right breast hyperenhancement. Patient ultimately underwent biopsy revealing invasive mammary carcinoma. She is s/p lumpectomy and sentinel node biopsy and pending chemotx.    Observations/Objective: Patient had a virtual visit with Dr. Janese Banks this morning and was told that she would require chemotherapy.  Patient discussed feeling saddened by this news.  She is concerned with potential hair loss and has requested information regarding the cooling cap system. This was relayed to Judeen Hammans, [place] who will coordinate. Patient is pending port placement and will need chemo class.   Patient says that her depression has been improved but she is having daily episodes of anxiety/panic.  She does feel Zoloft has helped.  Her previous Zoloft dose was 100 mg and will plan to titrate back to that dose.  She has 50 mg tablets, which are scored.  She was instructed to increase dose to 75 mg over the next week and I will send her a new prescription for 100 mg tablets.    Patient also takes alprazolam daily but says the 0.25 mg is not helping.  She is having to take 2 tablets to equal a 0.5 mg dose, which she finds more effective.  We will refill her on 0.5 mg dose.  I discussed option of counseling.  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  Patient says that her insomnia has improved with use of trazodone.  She does have occasional pain related to her seroma, which is required draining x2.  She will have follow-up with Dr. [name] for management.  Patient has established a new PCP with [facility].  Assessment and Plan: Breast cancer -plan is to start systemic chemotherapy.  Patient is followed by Dr. Janese Banks.  Depression -increase to Zoloft 100 mg daily  Anxiety -alprazolam 0.5 mg twice daily as needed #60  Insomnia -continue trazodone 25 to 50 mg nightly as needed  Follow Up Instructions: Follow-up virtual visit 1 month   I discussed the assessment and treatment plan with the patient. The patient was provided an opportunity to ask questions and all were answered. The patient agreed with the plan and demonstrated an understanding of the instructions.   The patient was advised to call back or seek an in-person evaluation if the symptoms worsen or if the condition fails to improve as anticipated.  I provided 15 minutes of non-face-to-face time during this encounter.   [name], NP

## 2020-05-27 ENCOUNTER — Encounter: Payer: Self-pay | Admitting: Oncology

## 2020-05-27 MED ORDER — PROCHLORPERAZINE MALEATE 10 MG PO TABS: 10.0000 mg | ORAL_TABLET | Freq: Four times a day (QID) | ORAL | 1 refills | Status: AC | PRN

## 2020-05-27 MED ORDER — ONDANSETRON HCL 8 MG PO TABS: 8.0000 mg | ORAL_TABLET | Freq: Two times a day (BID) | ORAL | 1 refills | Status: DC | PRN

## 2020-05-27 MED ORDER — LIDOCAINE-PRILOCAINE 2.5-2.5 % EX CREA: TOPICAL_CREAM | CUTANEOUS | 3 refills | Status: DC

## 2020-05-27 MED ORDER — DEXAMETHASONE 4 MG PO TABS: 8.0000 mg | ORAL_TABLET | Freq: Two times a day (BID) | ORAL | 1 refills | Status: DC
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
## 2020-05-27 NOTE — Progress Notes (Signed)
START ON PATHWAY REGIMEN - Breast     A cycle is every 21 days:     Docetaxel      Cyclophosphamide   **Always confirm dose/schedule in your pharmacy ordering system**  Patient Characteristics: Postoperative without Neoadjuvant Therapy (Pathologic Staging), Invasive Disease, Adjuvant Therapy, HER2 Negative/Unknown/Equivocal, ER Positive, Node Negative, pT1a-c, pN0/N52m or pT2 or Higher, pN0, MammaPrint(R), High Genomic Risk Therapeutic Status: Postoperative without Neoadjuvant Therapy (Pathologic Staging) AJCC Grade: G3 AJCC N Category: pN0 AJCC M Category: cM0 ER Status: Positive (+) AJCC 8 Stage Grouping: IB HER2 Status: Negative (-) Oncotype Dx Recurrence Score: Ordered Other Genomic Test AJCC T Category: pT2 PR Status: Positive (+) Has this patient completed genomic testing<= Yes - MammaPrint(R) MammaPrint(R) Score: High Genomic Risk Intent of Therapy: Curative Intent, Discussed with Patient

## 2020-05-28 ENCOUNTER — Encounter
Admission: RE | Disposition: A | Discharge status: Home / Self Care | Payer: Self-pay | Source: Home / Self Care | Attending: Surgery

## 2020-05-28 ENCOUNTER — Ambulatory Visit: Payer: Managed Care, Other (non HMO)

## 2020-05-28 ENCOUNTER — Ambulatory Visit: Payer: Managed Care, Other (non HMO) | Admitting: Certified Registered Nurse Anesthetist

## 2020-05-28 ENCOUNTER — Ambulatory Visit
Admission: RE | Admit: 2020-05-28 | Discharge: 2020-05-28 | Disposition: A | Discharge status: Home / Self Care | Payer: Managed Care, Other (non HMO) | Attending: Surgery | Admitting: Surgery

## 2020-05-28 ENCOUNTER — Encounter: Payer: Self-pay | Admitting: *Deleted

## 2020-05-28 ENCOUNTER — Ambulatory Visit
Admission: RE | Admit: 2020-05-28 | Discharge: 2020-05-28 | Disposition: A | Discharge status: Home / Self Care | Payer: Managed Care, Other (non HMO) | Source: Ambulatory Visit | Attending: Surgery | Admitting: Surgery

## 2020-05-28 ENCOUNTER — Encounter: Payer: Self-pay | Admitting: Surgery

## 2020-05-28 ENCOUNTER — Other Ambulatory Visit: Payer: Self-pay

## 2020-05-28 DIAGNOSIS — Z803 Family history of malignant neoplasm of breast: Secondary | ICD-10-CM | POA: Insufficient documentation

## 2020-05-28 DIAGNOSIS — Z95828 Presence of other vascular implants and grafts: Secondary | ICD-10-CM

## 2020-05-28 DIAGNOSIS — Z170 Estrogen receptor positive status [ER+]: Secondary | ICD-10-CM | POA: Diagnosis not present

## 2020-05-28 DIAGNOSIS — C50511 Malignant neoplasm of lower-outer quadrant of right female breast: Secondary | ICD-10-CM

## 2020-05-28 DIAGNOSIS — L7634 Postprocedural seroma of skin and subcutaneous tissue following other procedure: Secondary | ICD-10-CM | POA: Insufficient documentation

## 2020-05-28 DIAGNOSIS — Y838 Other surgical procedures as the cause of abnormal reaction of the patient, or of later complication, without mention of misadventure at the time of the procedure: Secondary | ICD-10-CM | POA: Diagnosis not present

## 2020-05-28 DIAGNOSIS — J45909 Unspecified asthma, uncomplicated: Secondary | ICD-10-CM | POA: Diagnosis not present

## 2020-05-28 DIAGNOSIS — C50411 Malignant neoplasm of upper-outer quadrant of right female breast: Secondary | ICD-10-CM

## 2020-05-28 DIAGNOSIS — Z79899 Other long term (current) drug therapy: Secondary | ICD-10-CM | POA: Insufficient documentation

## 2020-05-28 HISTORY — PX: PORTACATH PLACEMENT: SHX2246

## 2020-05-28 HISTORY — PX: RE-EXCISION OF BREAST LUMPECTOMY: SHX6048

## 2020-05-28 SURGERY — EXCISION, LESION, BREAST
Anesthesia: General | Site: Breast | Laterality: Right

## 2020-05-28 MED ORDER — ACETAMINOPHEN 500 MG PO TABS
ORAL_TABLET | ORAL | Status: AC
  Administered 2020-05-28: 1000 mg via ORAL
  Filled 2020-05-28: qty 2

## 2020-05-28 MED ORDER — HYDROCODONE-ACETAMINOPHEN 5-325 MG PO TABS: 1.0000 | ORAL_TABLET | Freq: Four times a day (QID) | ORAL | 0 refills | Status: DC | PRN

## 2020-05-28 MED ORDER — FENTANYL CITRATE (PF) 100 MCG/2ML IJ SOLN
INTRAMUSCULAR | Status: AC
  Administered 2020-05-28: 50 ug via INTRAVENOUS
  Filled 2020-05-28: qty 2

## 2020-05-28 MED ORDER — FENTANYL CITRATE (PF) 100 MCG/2ML IJ SOLN
INTRAMUSCULAR | Status: DC | PRN
  Administered 2020-05-28 (×2): 25 ug via INTRAVENOUS
  Administered 2020-05-28: 50 ug via INTRAVENOUS

## 2020-05-28 MED ORDER — ONDANSETRON HCL 4 MG/2ML IJ SOLN
INTRAMUSCULAR | Status: DC | PRN
  Administered 2020-05-28: 4 mg via INTRAVENOUS

## 2020-05-28 MED ORDER — CELECOXIB 200 MG PO CAPS: 200.0000 mg | ORAL_CAPSULE | ORAL | Status: AC

## 2020-05-28 MED ORDER — OXYCODONE HCL 5 MG/5ML PO SOLN: 5.0000 mg | Freq: Once | ORAL | Status: AC | PRN

## 2020-05-28 MED ORDER — ORAL CARE MOUTH RINSE: 15.0000 mL | Freq: Once | OROMUCOSAL | Status: AC

## 2020-05-28 MED ORDER — PROMETHAZINE HCL 25 MG/ML IJ SOLN
INTRAMUSCULAR | Status: AC
  Administered 2020-05-28: 6.25 mg via INTRAVENOUS
  Filled 2020-05-28: qty 1

## 2020-05-28 MED ORDER — MIDAZOLAM HCL 2 MG/2ML IJ SOLN
INTRAMUSCULAR | Status: DC | PRN
  Administered 2020-05-28: 2 mg via INTRAVENOUS

## 2020-05-28 MED ORDER — LIDOCAINE-EPINEPHRINE (PF) 1 %-1:200000 IJ SOLN
INTRAMUSCULAR | Status: DC | PRN
  Administered 2020-05-28: 20 mL via INTRAMUSCULAR

## 2020-05-28 MED ORDER — GABAPENTIN 300 MG PO CAPS: 300.0000 mg | ORAL_CAPSULE | ORAL | Status: AC

## 2020-05-28 MED ORDER — DEXAMETHASONE SODIUM PHOSPHATE 10 MG/ML IJ SOLN
INTRAMUSCULAR | Status: AC
  Filled 2020-05-28: qty 1

## 2020-05-28 MED ORDER — PROMETHAZINE HCL 25 MG/ML IJ SOLN: 6.2500 mg | INTRAMUSCULAR | Status: DC | PRN

## 2020-05-28 MED ORDER — DOCUSATE SODIUM 100 MG PO CAPS
100.0000 mg | ORAL_CAPSULE | Freq: Two times a day (BID) | ORAL | 0 refills | Status: AC | PRN
Start: 2020-05-28 — End: 2020-06-07

## 2020-05-28 MED ORDER — SODIUM CHLORIDE FLUSH 0.9 % IV SOLN
INTRAVENOUS | Status: AC
  Filled 2020-05-28: qty 20

## 2020-05-28 MED ORDER — PROPOFOL 10 MG/ML IV BOLUS
INTRAVENOUS | Status: DC | PRN
  Administered 2020-05-28: 200 mg via INTRAVENOUS

## 2020-05-28 MED ORDER — OXYCODONE HCL 5 MG PO TABS
ORAL_TABLET | ORAL | Status: AC
  Administered 2020-05-28: 5 mg via ORAL
  Filled 2020-05-28: qty 1

## 2020-05-28 MED ORDER — PHENYLEPHRINE HCL (PRESSORS) 10 MG/ML IV SOLN
INTRAVENOUS | Status: DC | PRN
  Administered 2020-05-28 (×8): 100 ug via INTRAVENOUS

## 2020-05-28 MED ORDER — CHLORHEXIDINE GLUCONATE CLOTH 2 % EX PADS: 6.0000 | MEDICATED_PAD | Freq: Once | CUTANEOUS | Status: DC

## 2020-05-28 MED ORDER — DEXAMETHASONE SODIUM PHOSPHATE 10 MG/ML IJ SOLN
INTRAMUSCULAR | Status: DC | PRN
  Administered 2020-05-28: 8 mg via INTRAVENOUS

## 2020-05-28 MED ORDER — CELECOXIB 200 MG PO CAPS
ORAL_CAPSULE | ORAL | Status: AC
  Administered 2020-05-28: 200 mg via ORAL
  Filled 2020-05-28: qty 1

## 2020-05-28 MED ORDER — LACTATED RINGERS IV SOLN: INTRAVENOUS | Status: DC

## 2020-05-28 MED ORDER — MIDAZOLAM HCL 2 MG/2ML IJ SOLN
INTRAMUSCULAR | Status: AC
  Filled 2020-05-28: qty 2

## 2020-05-28 MED ORDER — HEPARIN SOD (PORK) LOCK FLUSH 100 UNIT/ML IV SOLN
INTRAVENOUS | Status: AC
  Filled 2020-05-28: qty 5

## 2020-05-28 MED ORDER — GABAPENTIN 300 MG PO CAPS
ORAL_CAPSULE | ORAL | Status: AC
  Administered 2020-05-28: 300 mg via ORAL
  Filled 2020-05-28: qty 1

## 2020-05-28 MED ORDER — FENTANYL CITRATE (PF) 100 MCG/2ML IJ SOLN
INTRAMUSCULAR | Status: AC
  Filled 2020-05-28: qty 2

## 2020-05-28 MED ORDER — LIDOCAINE HCL 1 % IJ SOLN
INTRAMUSCULAR | Status: DC | PRN
  Administered 2020-05-28: 10 mL

## 2020-05-28 MED ORDER — CLINDAMYCIN PHOSPHATE 900 MG/50ML IV SOLN
INTRAVENOUS | Status: AC
  Filled 2020-05-28: qty 50

## 2020-05-28 MED ORDER — LIDOCAINE-EPINEPHRINE (PF) 1 %-1:200000 IJ SOLN
INTRAMUSCULAR | Status: AC
  Filled 2020-05-28: qty 30

## 2020-05-28 MED ORDER — BUPIVACAINE-EPINEPHRINE 0.5% -1:200000 IJ SOLN: INTRAMUSCULAR | Status: DC | PRN

## 2020-05-28 MED ORDER — IBUPROFEN 800 MG PO TABS
800.0000 mg | ORAL_TABLET | Freq: Three times a day (TID) | ORAL | 0 refills | Status: DC | PRN
Start: 2020-05-28 — End: 2020-06-11

## 2020-05-28 MED ORDER — ACETAMINOPHEN 325 MG PO TABS
650.0000 mg | ORAL_TABLET | Freq: Three times a day (TID) | ORAL | 0 refills | Status: AC | PRN
Start: 2020-05-28 — End: 2020-06-27

## 2020-05-28 MED ORDER — FENTANYL CITRATE (PF) 100 MCG/2ML IJ SOLN
25.0000 ug | INTRAMUSCULAR | Status: DC | PRN
  Administered 2020-05-28: 50 ug via INTRAVENOUS
  Administered 2020-05-28: 25 ug via INTRAVENOUS
  Administered 2020-05-28: 50 ug via INTRAVENOUS

## 2020-05-28 MED ORDER — CLINDAMYCIN PHOSPHATE 900 MG/50ML IV SOLN
900.0000 mg | Freq: Once | INTRAVENOUS | Status: AC
  Administered 2020-05-28: 900 mg via INTRAVENOUS

## 2020-05-28 MED ORDER — CHLORHEXIDINE GLUCONATE 0.12 % MT SOLN: 15.0000 mL | Freq: Once | OROMUCOSAL | Status: AC

## 2020-05-28 MED ORDER — CHLORHEXIDINE GLUCONATE 0.12 % MT SOLN
OROMUCOSAL | Status: AC
  Administered 2020-05-28: 15 mL via OROMUCOSAL
  Filled 2020-05-28: qty 15

## 2020-05-28 MED ORDER — FENTANYL CITRATE (PF) 100 MCG/2ML IJ SOLN
INTRAMUSCULAR | Status: AC
  Administered 2020-05-28: 25 ug via INTRAVENOUS
  Filled 2020-05-28: qty 2

## 2020-05-28 MED ORDER — OXYCODONE HCL 5 MG PO TABS: 5.0000 mg | ORAL_TABLET | Freq: Once | ORAL | Status: AC | PRN

## 2020-05-28 MED ORDER — ACETAMINOPHEN 500 MG PO TABS: 1000.0000 mg | ORAL_TABLET | ORAL | Status: AC

## 2020-05-28 MED ORDER — ONDANSETRON HCL 4 MG/2ML IJ SOLN
INTRAMUSCULAR | Status: AC
  Filled 2020-05-28: qty 2

## 2020-05-28 MED ORDER — PROPOFOL 10 MG/ML IV BOLUS
INTRAVENOUS | Status: AC
  Filled 2020-05-28: qty 20

## 2020-05-28 MED ORDER — LIDOCAINE HCL (CARDIAC) PF 100 MG/5ML IV SOSY
PREFILLED_SYRINGE | INTRAVENOUS | Status: DC | PRN
  Administered 2020-05-28: 60 mg via INTRAVENOUS

## 2020-05-28 MED ORDER — LIDOCAINE HCL (PF) 1 % IJ SOLN
INTRAMUSCULAR | Status: AC
  Filled 2020-05-28: qty 30

## 2020-05-28 MED ORDER — LIDOCAINE HCL (PF) 2 % IJ SOLN
INTRAMUSCULAR | Status: AC
  Filled 2020-05-28: qty 5

## 2020-05-28 MED ORDER — BUPIVACAINE HCL (PF) 0.5 % IJ SOLN
INTRAMUSCULAR | Status: AC
  Filled 2020-05-28: qty 30

## 2020-05-28 SURGICAL SUPPLY — 64 items
APPLIER CLIP 11 MED OPEN (CLIP)
BAG DECANTER FOR FLEXI CONT (MISCELLANEOUS) ×3 IMPLANT
BENZOIN TINCTURE PRP APPL 2/3 (GAUZE/BANDAGES/DRESSINGS) ×3 IMPLANT
BLADE SURG 15 STRL LF DISP TIS (BLADE) ×2 IMPLANT
BLADE SURG 15 STRL SS (BLADE) ×3
BLADE SURG SZ11 CARB STEEL (BLADE) ×3 IMPLANT
BOOT SUTURE AID YELLOW STND (SUTURE) ×3 IMPLANT
CANISTER SUCT 1200ML W/VALVE (MISCELLANEOUS) ×3 IMPLANT
CHLORAPREP W/TINT 26 (MISCELLANEOUS) ×3 IMPLANT
CLIP APPLIE 11 MED OPEN (CLIP) IMPLANT
CNTNR SPEC 2.5X3XGRAD LEK (MISCELLANEOUS) ×2
CONT SPEC 4OZ STER OR WHT (MISCELLANEOUS) ×1
CONT SPEC 4OZ STRL OR WHT (MISCELLANEOUS) ×2
CONTAINER SPEC 2.5X3XGRAD LEK (MISCELLANEOUS) ×2 IMPLANT
COVER LIGHT HANDLE STERIS (MISCELLANEOUS) ×6 IMPLANT
COVER WAND RF STERILE (DRAPES) ×3 IMPLANT
DECANTER SPIKE VIAL GLASS SM (MISCELLANEOUS) ×3 IMPLANT
DERMABOND ADVANCED (GAUZE/BANDAGES/DRESSINGS) ×1
DERMABOND ADVANCED .7 DNX12 (GAUZE/BANDAGES/DRESSINGS) ×2 IMPLANT
DEVICE DSSCT PLSMBLD 3.0S LGHT (MISCELLANEOUS) ×2 IMPLANT
DEVICE DUBIN SPECIMEN MAMMOGRA (MISCELLANEOUS) ×3 IMPLANT
DRAIN CHANNEL JP 15F RND 16 (MISCELLANEOUS) ×1 IMPLANT
DRAPE C-ARM XRAY 36X54 (DRAPES) ×3 IMPLANT
DRAPE LAPAROTOMY 77X122 PED (DRAPES) ×3 IMPLANT
DRSG TEGADERM 4X4.75 (GAUZE/BANDAGES/DRESSINGS) ×3 IMPLANT
ELECT CAUTERY BLADE 6.4 (BLADE) ×3 IMPLANT
ELECT CAUTERY BLADE TIP 2.5 (TIP) ×3
ELECT REM PT RETURN 9FT ADLT (ELECTROSURGICAL) ×3
ELECTRODE CAUTERY BLDE TIP 2.5 (TIP) ×2 IMPLANT
ELECTRODE REM PT RTRN 9FT ADLT (ELECTROSURGICAL) ×2 IMPLANT
GLOVE BIOGEL PI IND STRL 7.0 (GLOVE) ×2 IMPLANT
GLOVE BIOGEL PI INDICATOR 7.0 (GLOVE) ×1
GLOVE SURG SYN 6.5 ES PF (GLOVE) ×6 IMPLANT
GLOVE SURG SYN 6.5 PF PI (GLOVE) ×4 IMPLANT
GOWN STRL REUS W/ TWL LRG LVL3 (GOWN DISPOSABLE) ×6 IMPLANT
GOWN STRL REUS W/TWL LRG LVL3 (GOWN DISPOSABLE) ×9
IV NS 500ML (IV SOLUTION) ×3
IV NS 500ML BAXH (IV SOLUTION) ×2 IMPLANT
KIT MARKER MARGIN INK (KITS) ×1 IMPLANT
KIT PORT POWER 8FR ISP CVUE (Port) ×3 IMPLANT
KIT TURNOVER KIT A (KITS) ×3 IMPLANT
LABEL OR SOLS (LABEL) ×3 IMPLANT
LIGHT WAVEGUIDE WIDE FLAT (MISCELLANEOUS) ×1 IMPLANT
MARKER MARGIN CORRECT CLIP (MARKER) IMPLANT
NEEDLE HYPO 22GX1.5 SAFETY (NEEDLE) ×6 IMPLANT
PACK BASIN MINOR (MISCELLANEOUS) ×3 IMPLANT
PACK PORT-A-CATH (MISCELLANEOUS) ×3 IMPLANT
PLASMABLADE 3.0S W/LIGHT (MISCELLANEOUS) ×3
SPONGE VERSALON 4X4 4PLY (MISCELLANEOUS) ×3 IMPLANT
STRIP CLOSURE SKIN 1/2X4 (GAUZE/BANDAGES/DRESSINGS) ×3 IMPLANT
SUCT RESERVOIR 100CC (MISCELLANEOUS) ×1 IMPLANT
SUT MNCRL 4-0 (SUTURE) ×6
SUT MNCRL 4-0 27XMFL (SUTURE) ×4
SUT MNCRL AB 4-0 PS2 18 (SUTURE) ×3 IMPLANT
SUT PROLENE 2 0 SH DA (SUTURE) ×3 IMPLANT
SUT SILK 3 0 12 30 (SUTURE) IMPLANT
SUT VIC AB 3-0 SH 27 (SUTURE) ×6
SUT VIC AB 3-0 SH 27X BRD (SUTURE) ×4 IMPLANT
SUTURE MNCRL 4-0 27XMF (SUTURE) ×4 IMPLANT
SYR 10ML LL (SYRINGE) ×3 IMPLANT
SYR 20ML LL LF (SYRINGE) ×3 IMPLANT
SYR BULB IRRIG 60ML STRL (SYRINGE) ×3 IMPLANT
TUBING CONNECTING 10 (TUBING) ×3 IMPLANT
WATER STERILE IRR 1000ML POUR (IV SOLUTION) ×3 IMPLANT

## 2020-05-28 NOTE — Transfer of Care (Signed)
Immediate Anesthesia Transfer of Care Note  Patient: [name]  Procedure(s) Performed: RE-EXCISION OF BREAST LUMPECTOMY (Right Breast) INSERTION PORT-A-CATH (N/A )  Patient Location: PACU  Anesthesia Type:General  Level of Consciousness: drowsy  Airway & Oxygen Therapy: Patient Spontanous Breathing and Patient connected to face mask oxygen  Post-op Assessment: Report given to RN and Post -op Vital signs reviewed and stable  Post vital signs: Reviewed and stable  Last Vitals:  Vitals Value Taken Time  BP 106/68 05/28/20 1158  Temp    Pulse 78 05/28/20 1201  Resp 16 05/28/20 1200  SpO2 99 % 05/28/20 1201  Vitals shown include unvalidated device data.  Last Pain:  Vitals:   05/28/20 1158  TempSrc:   PainSc: (P) Asleep         Complications: No complications documented.

## 2020-05-28 NOTE — Discharge Instructions (Signed)
Biopsy and port placement, Care After This sheet gives you information about how to care for yourself after your procedure. Your health care provider may also give you more specific instructions. If you have problems or questions, contact your health care provider. What can I expect after the procedure? After the procedure, it is common to have:  Soreness.  Bruising.  Itching. Follow these instructions at home: site care Follow instructions from your health care provider about how to take care of your site. Make sure you:  Wash your hands with soap and water before and after you change your bandage (dressing). If soap and water are not available, use hand sanitizer.  Leave stitches (sutures), skin glue, or adhesive strips in place. These skin closures may need to stay in place for 2 weeks or longer. If adhesive strip edges start to loosen and curl up, you may trim the loose edges. Do not remove adhesive strips completely unless your health care provider tells you to do that.  If the area bleeds or bruises, apply gentle pressure for 10 minutes.  OK TO SHOWER IN 24HRS  Check your site every day for signs of infection. Check for:  Redness, swelling, or pain.  Fluid or blood.  Warmth.  Pus or a bad smell.  General instructions  Rest and then return to your normal activities as told by your health care provider. .  tylenol and advil as needed for discomfort.  Please alternate between the two every four hours as needed for pain.   .  Use narcotics, if prescribed, only when tylenol and motrin is not enough to control pain. .  325-650mg  every 8hrs to max of 3000mg /24hrs (including the 325mg  in every norco dose) for the tylenol.   .  Advil up to 800mg  per dose every 8hrs as needed for pain.    Keep all follow-up visits as told by your health care provider. This is important. Contact a health care provider if:  You have redness, swelling, or pain around your site.  You have fluid  or blood coming from your site.  Your site feels warm to the touch.  You have pus or a bad smell coming from your site.  You have a fever.  Your sutures, skin glue, or adhesive strips loosen or come off sooner than expected. Get help right away if:  You have bleeding that does not stop with pressure or a dressing. Summary  After the procedure, it is common to have some soreness, bruising, and itching at the site.  Follow instructions from your health care provider about how to take care of your site.  Check your site every day for signs of infection.  Contact a health care provider if you have redness, swelling, or pain around your site, or your site feels warm to the touch.  Keep all follow-up visits as told by your health care provider. This is important. This information is not intended to replace advice given to you by your health care provider. Make sure you discuss any questions you have with your health care provider. Document Released: 10/30/2015 Document Revised: 04/02/2018 Document Reviewed: 04/02/2018 Elsevier Interactive Patient Education  [employer].
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
## 2020-05-28 NOTE — Anesthesia Postprocedure Evaluation (Signed)
Anesthesia Post Note  Patient: [name]  Procedure(s) Performed: RE-EXCISION OF BREAST LUMPECTOMY (Right Breast) INSERTION PORT-A-CATH (N/A )  Patient location during evaluation: PACU Anesthesia Type: General Level of consciousness: awake and alert Pain management: pain level controlled Vital Signs Assessment: post-procedure vital signs reviewed and stable Respiratory status: spontaneous breathing, nonlabored ventilation, respiratory function stable and patient connected to nasal cannula oxygen Cardiovascular status: blood pressure returned to baseline and stable Postop Assessment: no apparent nausea or vomiting Anesthetic complications: no   No complications documented.   Last Vitals:  Vitals:   05/28/20 1258 05/28/20 1302  BP: 110/74   Pulse: 87 81  Resp: 14 (!) 7  Temp:  (!) 36.3 C  SpO2: 96% 98%    Last Pain:  Vitals:   05/28/20 1250  TempSrc:   PainSc: 6                  Precious Haws Jais Demir

## 2020-05-28 NOTE — Anesthesia Procedure Notes (Signed)
Procedure Name: LMA Insertion Date/Time: 05/28/2020 9:14 AM Performed by: Eben Burow, CRNA Pre-anesthesia Checklist: Patient identified, Emergency Drugs available, Suction available and Patient being monitored Patient Re-evaluated:Patient Re-evaluated prior to induction Oxygen Delivery Method: Circle system utilized Preoxygenation: Pre-oxygenation with 100% oxygen Induction Type: IV induction LMA: LMA inserted LMA Size: 4.0 Number of attempts: 1 Placement Confirmation: positive ETCO2 and breath sounds checked- equal and bilateral Tube secured with: Tape Dental Injury: Teeth and Oropharynx as per pre-operative assessment

## 2020-05-28 NOTE — Op Note (Addendum)
Preoperative diagnosis: Right breast carcinoma, axillary seroma  Postoperative diagnosis: Same.   Procedure: Reexcision of right breast carcinoma margins, I&D and drain placement for recurrent right axillary seroma, Port-A-Cath placement with fluoroscopy guidance Anesthesia: GETA  Surgeon: Dr. Benjamine Sprague  Wound Classification: Clean  Indications: Patient is a 39 y.o. female status post lumpectomy and sentinel lymph node biopsy. Positive margins noted in final pathology therefore has returned for reexcision of positive margins. She has also developed a recurrent seroma at the sentinel lymph node biopsy site that has been symptomatic despite couple needle decompressions. Discussion was had about risk benefits alternatives to placing the drain and to relieve symptoms. She is agreeable to placing a drain in the area. She was also recommended adjuvant chemotherapy to further decrease risk of recurrence so port will be placed today as well.  Specimen: Anterior and lateral breast margins  Complications: None  Estimated Blood Loss: 15 mL  Findings: 1. Pathology call refers gross examination of specimens were negative. 2. No other palpable mass or lymph node identified.   Description of procedure: The patient was taken to the operating room and placed supine on the operating table, and after general anesthesia the righ breast and axilla were prepped and draped in the usual sterile fashion. A time-out was completed verifying correct patient, procedure, site, positioning, and implant(s) and/or special equipment prior to beginning this procedure.  Local infused to prior incision site and incision made. Flaps were raised and  Sharp and blunt dissection was then taken down to the previous excision cavity. Anterior and lateral margins were then removed, each specimen was oriented with paint and sent to pathology. Callback noted gross margins were all negative.  Local infused to the sentinel lymph node  biopsy incision, and incision made through old scar, immediate drainage of serous fluid noted. Measured approximately 200 mL again. Dissection was carried down to visualize the entire sentinel lymph node biopsy site, there was no obvious pathology noted. AdvaSeal was then sprayed throughout the entire biopsy cavity, in hopes of facilitating closure of any areas of persistent serous drainage. 15 French drain was then placed into the abdominal cavity and secured to the skin using 3-0 nylon. Both wounds extensively irrigated and both incisions closed in layers with  interrupted sutures of 3-0 Vicryl in deep dermal layer and a running subcuticular suture of Monocryl 4-0, then dressed with dermabond.  In Trendelenburg position, Left neck and shoulder area access site was prepped and draped in the usual sterile fashion.  Left subclavian vein access obtained using Seldinger technique, by which local anesthetic was injected over the area, and access needle was inserted via anatomical landmarks through which soft guidewire was advanced with no resistance, over which access needle was withdrawn. Guidewire was secured, attention was directed to injection of local anesthetic along the planned pocket site. 2-3 cm transverse left chest incision extended from needle inseration site and confirmed to accommodate the subcutaneous port. Insertion sheath was advanced over the guidewire, which was withdrawn along with the insertion sheath dilator with no resistance. The catheter was introduced through the sheath and tip left in the Atrio Caval junction under fluoro guidance and catheter cut to appropriate length.  Catheter connected to port and placed within planned fixation site.  Fluro confirmed no kink within the entire length of the catheter at this point.  Port fixed to the pocket on two side to avoid twisting. Port was confirmed to withdraw blood and flush easily with included Heuber needle, and port heplocked.  Dermis at the  subcutaneous pocket was re-approximated using buried interrupted 3-0 Vicryl suture, and 4-0 Monocryl suture was used to re-approximate skin at the insertion and subcutaneous port sites in running subcuticular fashion.  Incision then dressed with dermabond. Patient was then awakened from anesthesia and transferred to PACU in stable condition. Fluoro images saved in Epic.  CXR post op confirmed proper placement of port and no evidence of pneumothorax.   The patient tolerated the procedure well and was taken to the postanesthesia care unit in stable condition. Sponge and instrument count correct at end of procedure.

## 2020-05-28 NOTE — Interval H&P Note (Signed)
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  The various methods of treatment have been discussed with the patient and family. After consideration of risks, benefits and other options for treatment, the patient has consented to  Procedure(s): RE-EXCISION OF BREAST LUMPECTOMY (Right) INSERTION PORT-A-CATH (N/A) as a surgical intervention. WILL ALSO BE PLACING A DRAIN FOR THE RECURRENT SEROMA AT SLNB SITE The patient's history has been reviewed, patient examined, no change in status, stable for surgery.  I have reviewed the patient's chart and labs.  Questions were answered to the patient's satisfaction.     [name] [name]

## 2020-05-29 ENCOUNTER — Encounter: Payer: Self-pay | Admitting: Surgery

## 2020-06-01 ENCOUNTER — Other Ambulatory Visit: Payer: Self-pay | Admitting: Pathology

## 2020-06-01 LAB — SURGICAL PATHOLOGY

## 2020-06-02 ENCOUNTER — Other Ambulatory Visit: Payer: Self-pay | Admitting: Oncology

## 2020-06-03 NOTE — Patient Instructions (Signed)
Docetaxel injection What is this medicine? DOCETAXEL (doe se TAX el) is a chemotherapy drug. It targets fast dividing cells, like cancer cells, and causes these cells to die. This medicine is used to treat many types of cancers like breast cancer, certain stomach cancers, head and neck cancer, lung cancer, and prostate cancer. This medicine may be used for other purposes; ask your health care provider or pharmacist if you have questions. COMMON BRAND NAME(S): Docefrez, Taxotere What should I tell my health care provider before I take this medicine? They need to know if you have any of these conditions:  infection (especially a virus infection such as chickenpox, cold sores, or herpes)  liver disease  low blood counts, like low white cell, platelet, or red cell counts  an unusual or allergic reaction to docetaxel, polysorbate 80, other chemotherapy agents, other medicines, foods, dyes, or preservatives  pregnant or trying to get pregnant  breast-feeding How should I use this medicine? This drug is given as an infusion into a vein. It is administered in a hospital or clinic by a specially trained health care professional. Talk to your pediatrician regarding the use of this medicine in children. Special care may be needed. Overdosage: If you think you have taken too much of this medicine contact a poison control center or emergency room at once. NOTE: This medicine is only for you. Do not share this medicine with others. What if I miss a dose? It is important not to miss your dose. Call your doctor or health care professional if you are unable to keep an appointment. What may interact with this medicine?  aprepitant  certain antibiotics like erythromycin or clarithromycin  certain antivirals for HIV or hepatitis  certain medicines for fungal infections like fluconazole, itraconazole, ketoconazole, posaconazole, or  voriconazole  cimetidine  ciprofloxacin  conivaptan  cyclosporine  dronedarone  fluvoxamine  grapefruit juice  imatinib  verapamil This list may not describe all possible interactions. Give your health care provider a list of all the medicines, herbs, non-prescription drugs, or dietary supplements you use. Also tell them if you smoke, drink alcohol, or use illegal drugs. Some items may interact with your medicine. What should I watch for while using this medicine? Your condition will be monitored carefully while you are receiving this medicine. You will need important blood work done while you are taking this medicine. Call your doctor or health care professional for advice if you get a fever, chills or sore throat, or other symptoms of a cold or flu. Do not treat yourself. This drug decreases your body's ability to fight infections. Try to avoid being around people who are sick. Some products may contain alcohol. Ask your health care professional if this medicine contains alcohol. Be sure to tell all health care professionals you are taking this medicine. Certain medicines, like metronidazole and disulfiram, can cause an unpleasant reaction when taken with alcohol. The reaction includes flushing, headache, nausea, vomiting, sweating, and increased thirst. The reaction can last from 30 minutes to several hours. You may get drowsy or dizzy. Do not drive, use machinery, or do anything that needs mental alertness until you know how this medicine affects you. Do not stand or sit up quickly, especially if you are an older patient. This reduces the risk of dizzy or fainting spells. Alcohol may interfere with the effect of this medicine. Talk to your health care professional about your risk of cancer. You may be more at risk for certain types of cancer if  you take this medicine. Do not become pregnant while taking this medicine or for 6 months after stopping it. Women should inform their doctor if  they wish to become pregnant or think they might be pregnant. There is a potential for serious side effects to an unborn child. Talk to your health care professional or pharmacist for more information. Do not breast-feed an infant while taking this medicine or for 1 week after stopping it. Males who get this medicine must use a condom during sex with females who can get pregnant. If you get a woman pregnant, the baby could have birth defects. The baby could die before they are born. You will need to continue wearing a condom for 3 months after stopping the medicine. Tell your health care provider right away if your partner becomes pregnant while you are taking this medicine. This may interfere with the ability to father a child. You should talk to your doctor or health care professional if you are concerned about your fertility. What side effects may I notice from receiving this medicine? Side effects that you should report to your doctor or health care professional as soon as possible:  allergic reactions like skin rash, itching or hives, swelling of the face, lips, or tongue  blurred vision  breathing problems  changes in vision  low blood counts - This drug may decrease the number of white blood cells, red blood cells and platelets. You may be at increased risk for infections and bleeding.  nausea and vomiting  pain, redness or irritation at site where injected  pain, tingling, numbness in the hands or feet  redness, blistering, peeling, or loosening of the skin, including inside the mouth  signs of decreased platelets or bleeding - bruising, pinpoint red spots on the skin, black, tarry stools, nosebleeds  signs of decreased red blood cells - unusually weak or tired, fainting spells, lightheadedness  signs of infection - fever or chills, cough, sore throat, pain or difficulty passing urine  swelling of the ankle, feet, hands Side effects that usually do not require medical attention  (report to your doctor or health care professional if they continue or are bothersome):  constipation  diarrhea  fingernail or toenail changes  hair loss  loss of appetite  mouth sores  muscle pain This list may not describe all possible side effects. Call your doctor for medical advice about side effects. You may report side effects to FDA at 1-800-FDA-1088. Where should I keep my medicine? This drug is given in a hospital or clinic and will not be stored at home. NOTE: This sheet is a summary. It may not cover all possible information. If you have questions about this medicine, talk to your doctor, pharmacist, or health care provider.  2020 Elsevier/Gold Standard (2019-05-30 10:19:06) Cyclophosphamide Injection What is this medicine? CYCLOPHOSPHAMIDE (sye kloe FOSS fa mide) is a chemotherapy drug. It slows the growth of cancer cells. This medicine is used to treat many types of cancer like lymphoma, myeloma, leukemia, breast cancer, and ovarian cancer, to name a few. This medicine may be used for other purposes; ask your health care provider or pharmacist if you have questions. COMMON BRAND NAME(S): Cytoxan, Neosar What should I tell my health care provider before I take this medicine? They need to know if you have any of these conditions:  heart disease  history of irregular heartbeat  infection  kidney disease  liver disease  low blood counts, like white cells, platelets, or red blood cells    on hemodialysis  recent or ongoing radiation therapy  scarring or thickening of the lungs  trouble passing urine  an unusual or allergic reaction to cyclophosphamide, other medicines, foods, dyes, or preservatives  pregnant or trying to get pregnant  breast-feeding How should I use this medicine? This drug is usually given as an injection into a vein or muscle or by infusion into a vein. It is administered in a hospital or clinic by a specially trained health care  professional. Talk to your pediatrician regarding the use of this medicine in children. Special care may be needed. Overdosage: If you think you have taken too much of this medicine contact a poison control center or emergency room at once. NOTE: This medicine is only for you. Do not share this medicine with others. What if I miss a dose? It is important not to miss your dose. Call your doctor or health care professional if you are unable to keep an appointment. What may interact with this medicine?  amphotericin B  azathioprine  certain antivirals for HIV or hepatitis  certain medicines for blood pressure, heart disease, irregular heart beat  certain medicines that treat or prevent blood clots like warfarin  certain other medicines for cancer  cyclosporine  etanercept  indomethacin  medicines that relax muscles for surgery  medicines to increase blood counts  metronidazole This list may not describe all possible interactions. Give your health care provider a list of all the medicines, herbs, non-prescription drugs, or dietary supplements you use. Also tell them if you smoke, drink alcohol, or use illegal drugs. Some items may interact with your medicine. What should I watch for while using this medicine? Your condition will be monitored carefully while you are receiving this medicine. You may need blood work done while you are taking this medicine. Drink water or other fluids as directed. Urinate often, even at night. Some products may contain alcohol. Ask your health care professional if this medicine contains alcohol. Be sure to tell all health care professionals you are taking this medicine. Certain medicines, like metronidazole and disulfiram, can cause an unpleasant reaction when taken with alcohol. The reaction includes flushing, headache, nausea, vomiting, sweating, and increased thirst. The reaction can last from 30 minutes to several hours. Do not become pregnant while  taking this medicine or for 1 year after stopping it. Women should inform their health care professional if they wish to become pregnant or think they might be pregnant. Men should not father a child while taking this medicine and for 4 months after stopping it. There is potential for serious side effects to an unborn child. Talk to your health care professional for more information. Do not breast-feed an infant while taking this medicine or for 1 week after stopping it. This medicine has caused ovarian failure in some women. This medicine may make it more difficult to get pregnant. Talk to your health care professional if you are concerned about your fertility. This medicine has caused decreased sperm counts in some men. This may make it more difficult to father a child. Talk to your health care professional if you are concerned about your fertility. Call your health care professional for advice if you get a fever, chills, or sore throat, or other symptoms of a cold or flu. Do not treat yourself. This medicine decreases your body's ability to fight infections. Try to avoid being around people who are sick. Avoid taking medicines that contain aspirin, acetaminophen, ibuprofen, naproxen, or ketoprofen unless instructed by  your health care professional. These medicines may hide a fever. Talk to your health care professional about your risk of cancer. You may be more at risk for certain types of cancer if you take this medicine. If you are going to need surgery or other procedure, tell your health care professional that you are using this medicine. Be careful brushing or flossing your teeth or using a toothpick because you may get an infection or bleed more easily. If you have any dental work done, tell your dentist you are receiving this medicine. What side effects may I notice from receiving this medicine? Side effects that you should report to your doctor or health care professional as soon as  possible:  allergic reactions like skin rash, itching or hives, swelling of the face, lips, or tongue  breathing problems  nausea, vomiting  signs and symptoms of bleeding such as bloody or black, tarry stools; red or dark brown urine; spitting up blood or brown material that looks like coffee grounds; red spots on the skin; unusual bruising or bleeding from the eyes, gums, or nose  signs and symptoms of heart failure like fast, irregular heartbeat, sudden weight gain; swelling of the ankles, feet, hands  signs and symptoms of infection like fever; chills; cough; sore throat; pain or trouble passing urine  signs and symptoms of kidney injury like trouble passing urine or change in the amount of urine  signs and symptoms of liver injury like dark yellow or brown urine; general ill feeling or flu-like symptoms; light-colored stools; loss of appetite; nausea; right upper belly pain; unusually weak or tired; yellowing of the eyes or skin Side effects that usually do not require medical attention (report to your doctor or health care professional if they continue or are bothersome):  confusion  decreased hearing  diarrhea  facial flushing  hair loss  headache  loss of appetite  missed menstrual periods  signs and symptoms of low red blood cells or anemia such as unusually weak or tired; feeling faint or lightheaded; falls  skin discoloration This list may not describe all possible side effects. Call your doctor for medical advice about side effects. You may report side effects to FDA at 1-800-FDA-1088. Where should I keep my medicine? This drug is given in a hospital or clinic and will not be stored at home. NOTE: This sheet is a summary. It may not cover all possible information. If you have questions about this medicine, talk to your doctor, pharmacist, or health care provider.  2020 Elsevier/Gold Standard (2019-07-08 09:53:29)

## 2020-06-04 ENCOUNTER — Inpatient Hospital Stay: Payer: Managed Care, Other (non HMO)

## 2020-06-04 ENCOUNTER — Encounter: Payer: Self-pay | Admitting: Oncology

## 2020-06-04 ENCOUNTER — Other Ambulatory Visit: Payer: Self-pay

## 2020-06-04 ENCOUNTER — Inpatient Hospital Stay (HOSPITAL_BASED_OUTPATIENT_CLINIC_OR_DEPARTMENT_OTHER): Payer: Managed Care, Other (non HMO) | Admitting: Oncology

## 2020-06-04 DIAGNOSIS — Z170 Estrogen receptor positive status [ER+]: Secondary | ICD-10-CM

## 2020-06-04 DIAGNOSIS — C50411 Malignant neoplasm of upper-outer quadrant of right female breast: Secondary | ICD-10-CM | POA: Diagnosis not present

## 2020-06-04 DIAGNOSIS — C50911 Malignant neoplasm of unspecified site of right female breast: Secondary | ICD-10-CM | POA: Diagnosis not present

## 2020-06-04 DIAGNOSIS — Z5189 Encounter for other specified aftercare: Secondary | ICD-10-CM | POA: Diagnosis not present

## 2020-06-04 DIAGNOSIS — Z5111 Encounter for antineoplastic chemotherapy: Secondary | ICD-10-CM | POA: Diagnosis present

## 2020-06-04 NOTE — Progress Notes (Signed)
Pharmacist Chemotherapy Monitoring - Initial Assessment    Anticipated start date: 06/11/20  Regimen:  . Are orders appropriate based on the patient's diagnosis, regimen, and cycle? Yes . Does the plan date match the patient's scheduled date? Yes . Is the sequencing of drugs appropriate? Yes . Are the premedications appropriate for the patient's regimen? Yes . Prior Authorization for treatment is: Approved o If applicable, is the correct biosimilar selected based on the patient's insurance? not applicable  Organ Function and Labs: Marland Kitchen Are dose adjustments needed based on the patient's renal function, hepatic function, or hematologic function? No . Are appropriate labs ordered prior to the start of patient's treatment? Yes . Other organ system assessment, if indicated: women of childbearing potential: pregnancy status Had hysterectomy  . The following baseline labs, if indicated, have been ordered: N/A  Dose Assessment: . Are the drug doses appropriate? Yes . Are the following correct: o Drug concentrations Yes o IV fluid compatible with drug Yes o Administration routes Yes o Timing of therapy Yes . If applicable, does the patient have documented access for treatment and/or plans for port-a-cath placement? yes . If applicable, have lifetime cumulative doses been properly documented and assessed? yes Lifetime Dose Tracking  No doses have been documented on this patient for the following tracked chemicals: Doxorubicin, Epirubicin, Idarubicin, Daunorubicin, Mitoxantrone, Bleomycin, Oxaliplatin, Carboplatin, Liposomal Doxorubicin  o   Toxicity Monitoring/Prevention: . The patient has the following take home antiemetics prescribed: Ondansetron, Prochlorperazine and Dexamethasone . The patient has the following take home medications prescribed: N/A . Medication allergies and previous infusion related reactions, if applicable, have been reviewed and addressed. Yes . The patient's current  medication list has been assessed for drug-drug interactions with their chemotherapy regimen. no significant drug-drug interactions were identified on review.  Order Review: . Are the treatment plan orders signed? No . Is the patient scheduled to see a provider prior to their treatment? Yes  I verify that I have reviewed each item in the above checklist and answered each question accordingly.  [name] 06/04/2020 9:10 AM
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
## 2020-06-11 ENCOUNTER — Inpatient Hospital Stay (HOSPITAL_BASED_OUTPATIENT_CLINIC_OR_DEPARTMENT_OTHER): Payer: Managed Care, Other (non HMO) | Admitting: Oncology

## 2020-06-11 ENCOUNTER — Inpatient Hospital Stay: Payer: Managed Care, Other (non HMO)

## 2020-06-11 ENCOUNTER — Encounter: Payer: Self-pay | Admitting: Oncology

## 2020-06-11 ENCOUNTER — Other Ambulatory Visit: Payer: Self-pay

## 2020-06-11 VITALS — BP 116/85 | HR 83 | Temp 96.8°F | Resp 18 | Wt 200.9 lb

## 2020-06-11 VITALS — BP 113/79 | HR 63 | Temp 96.4°F | Resp 16

## 2020-06-11 DIAGNOSIS — C50411 Malignant neoplasm of upper-outer quadrant of right female breast: Secondary | ICD-10-CM

## 2020-06-11 DIAGNOSIS — Z90710 Acquired absence of both cervix and uterus: Secondary | ICD-10-CM

## 2020-06-11 DIAGNOSIS — Z170 Estrogen receptor positive status [ER+]: Secondary | ICD-10-CM

## 2020-06-11 DIAGNOSIS — Z5111 Encounter for antineoplastic chemotherapy: Secondary | ICD-10-CM

## 2020-06-11 LAB — CBC WITH DIFFERENTIAL/PLATELET
Abs Immature Granulocytes: 0.09 10*3/uL — ABNORMAL HIGH (ref 0.00–0.07)
Basophils Absolute: 0 10*3/uL (ref 0.0–0.1)
Basophils Relative: 0 %
Eosinophils Absolute: 0 10*3/uL (ref 0.0–0.5)
Eosinophils Relative: 0 %
HCT: 38.1 % (ref 36.0–46.0)
Hemoglobin: 13.3 g/dL (ref 12.0–15.0)
Immature Granulocytes: 1 %
Lymphocytes Relative: 19 %
Lymphs Abs: 2.7 10*3/uL (ref 0.7–4.0)
MCH: 29.4 pg (ref 26.0–34.0)
MCHC: 34.9 g/dL (ref 30.0–36.0)
MCV: 84.1 fL (ref 80.0–100.0)
Monocytes Absolute: 0.8 10*3/uL (ref 0.1–1.0)
Monocytes Relative: 6 %
Neutro Abs: 10.4 10*3/uL — ABNORMAL HIGH (ref 1.7–7.7)
Neutrophils Relative %: 74 %
Platelets: 358 10*3/uL (ref 150–400)
RBC: 4.53 MIL/uL (ref 3.87–5.11)
RDW: 11.9 % (ref 11.5–15.5)
WBC: 14.1 10*3/uL — ABNORMAL HIGH (ref 4.0–10.5)
nRBC: 0 % (ref 0.0–0.2)

## 2020-06-11 LAB — COMPREHENSIVE METABOLIC PANEL
ALT: 24 U/L (ref 0–44)
AST: 28 U/L (ref 15–41)
Albumin: 4.3 g/dL (ref 3.5–5.0)
Alkaline Phosphatase: 70 U/L (ref 38–126)
Anion gap: 10 (ref 5–15)
BUN: 10 mg/dL (ref 6–20)
CO2: 22 mmol/L (ref 22–32)
Calcium: 9.6 mg/dL (ref 8.9–10.3)
Chloride: 105 mmol/L (ref 98–111)
Creatinine, Ser: 0.57 mg/dL (ref 0.44–1.00)
GFR calc Af Amer: >60 mL/min (ref >60)
GFR calc non Af Amer: >60 mL/min (ref >60)
Glucose, Bld: 101 mg/dL — ABNORMAL HIGH (ref 70–99)
Potassium: 4.2 mmol/L (ref 3.5–5.1)
Sodium: 137 mmol/L (ref 135–145)
Total Bilirubin: 0.3 mg/dL (ref 0.3–1.2)
Total Protein: 7.7 g/dL (ref 6.5–8.1)

## 2020-06-11 MED ORDER — SODIUM CHLORIDE 0.9 % IV SOLN
600.0000 mg/m2 | Freq: Once | INTRAVENOUS | Status: AC
  Administered 2020-06-11: 1280 mg via INTRAVENOUS
  Filled 2020-06-11: qty 50

## 2020-06-11 MED ORDER — PALONOSETRON HCL INJECTION 0.25 MG/5ML
0.2500 mg | Freq: Once | INTRAVENOUS | Status: AC
  Administered 2020-06-11: 0.25 mg via INTRAVENOUS
  Filled 2020-06-11: qty 5

## 2020-06-11 MED ORDER — SODIUM CHLORIDE 0.9% FLUSH
10.0000 mL | INTRAVENOUS | Status: DC | PRN
  Administered 2020-06-11: 10 mL
  Filled 2020-06-11: qty 10

## 2020-06-11 MED ORDER — SODIUM CHLORIDE 0.9 % IV SOLN
10.0000 mg | Freq: Once | INTRAVENOUS | Status: AC
  Administered 2020-06-11: 10 mg via INTRAVENOUS
  Filled 2020-06-11: qty 10

## 2020-06-11 MED ORDER — SODIUM CHLORIDE 0.9 % IV SOLN
Freq: Once | INTRAVENOUS | Status: AC
  Filled 2020-06-11: qty 250

## 2020-06-11 MED ORDER — SODIUM CHLORIDE 0.9 % IV SOLN
75.0000 mg/m2 | Freq: Once | INTRAVENOUS | Status: AC
  Administered 2020-06-11: 160 mg via INTRAVENOUS
  Filled 2020-06-11: qty 16

## 2020-06-11 MED ORDER — HEPARIN SOD (PORK) LOCK FLUSH 100 UNIT/ML IV SOLN
INTRAVENOUS | Status: AC
  Filled 2020-06-11: qty 5

## 2020-06-11 MED ORDER — HEPARIN SOD (PORK) LOCK FLUSH 100 UNIT/ML IV SOLN
500.0000 [IU] | Freq: Once | INTRAVENOUS | Status: AC | PRN
  Administered 2020-06-11: 500 [IU]
  Filled 2020-06-11: qty 5

## 2020-06-11 MED ORDER — PEGFILGRASTIM 6 MG/0.6ML ~~LOC~~ PSKT
6.0000 mg | PREFILLED_SYRINGE | Freq: Once | SUBCUTANEOUS | Status: AC
  Administered 2020-06-11: 6 mg via SUBCUTANEOUS
  Filled 2020-06-11: qty 0.6
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
## 2020-06-12 LAB — FOLLICLE STIMULATING HORMONE: FSH: 6.5 m[IU]/mL

## 2020-06-12 LAB — ESTRADIOL: Estradiol: 157 pg/mL

## 2020-06-15 ENCOUNTER — Telehealth: Payer: Self-pay

## 2020-06-15 NOTE — Telephone Encounter (Signed)
Telephone call to patient on Friday 06/12/2020 for follow up after receiving first infusion.   Patient states infusion went great.  States having some nausea but controlled with anti-nausea meds.  States she is drinking plenty of fluids.   Denies any or vomiting.  Encouraged patient to call for any concerns or questions.

## 2020-06-18 ENCOUNTER — Other Ambulatory Visit: Payer: Self-pay | Admitting: *Deleted

## 2020-06-18 ENCOUNTER — Telehealth: Payer: Self-pay | Admitting: *Deleted

## 2020-06-18 MED ORDER — OXYCODONE HCL 5 MG PO TABS: 5.0000 mg | ORAL_TABLET | ORAL | 0 refills | Status: DC | PRN

## 2020-06-18 NOTE — Telephone Encounter (Signed)
Per Dr Janese Banks, Oxycodone 5 mg every 4 hours as needed and may take 2 if 1 does not work, Offered patient appointment today if she thinks she needs IV fluids, but she declined stating she will keep her appointment for tomorrow. I went over Oxycodone instructions with patient, she stated OK and thanked me.

## 2020-06-18 NOTE — Telephone Encounter (Signed)
Patient left message on voice mail yesterday afternoon that she is having pain all over including headache . I returned call this morning, patient states she feels a little better this morning, but she is still having hip and knee pain and is not able to sleep. Her headache has almost gone away, she has pain across her shoulders. She states she feels "shaky" this morning She had Neulasta Friday and this pain started on Tuesday. She has been taking Claritin, Tylenol, and even took some Vicodin that she had on hand. Denies fever, is not drinking a lot of fluid or eating a whole lot, but she never eats much. States nausea is gone now. She is asking for something stronger for pain. States urinating normal amt, but it is dark in color, She had been constipated, but is now having normal bowel movements. Please advise.

## 2020-06-19 ENCOUNTER — Inpatient Hospital Stay: Payer: Managed Care, Other (non HMO) | Attending: Oncology

## 2020-06-19 ENCOUNTER — Inpatient Hospital Stay (HOSPITAL_BASED_OUTPATIENT_CLINIC_OR_DEPARTMENT_OTHER): Payer: Managed Care, Other (non HMO) | Admitting: Oncology

## 2020-06-19 ENCOUNTER — Other Ambulatory Visit: Payer: Self-pay

## 2020-06-19 ENCOUNTER — Encounter: Payer: Self-pay | Admitting: Oncology

## 2020-06-19 ENCOUNTER — Inpatient Hospital Stay: Payer: Managed Care, Other (non HMO)

## 2020-06-19 VITALS — BP 119/75 | HR 121 | Temp 96.6°F | Resp 18 | Wt 209.7 lb

## 2020-06-19 DIAGNOSIS — C50411 Malignant neoplasm of upper-outer quadrant of right female breast: Secondary | ICD-10-CM

## 2020-06-19 DIAGNOSIS — T451X5A Adverse effect of antineoplastic and immunosuppressive drugs, initial encounter: Secondary | ICD-10-CM | POA: Insufficient documentation

## 2020-06-19 DIAGNOSIS — R110 Nausea: Secondary | ICD-10-CM | POA: Insufficient documentation

## 2020-06-19 DIAGNOSIS — Z5111 Encounter for antineoplastic chemotherapy: Secondary | ICD-10-CM | POA: Insufficient documentation

## 2020-06-19 DIAGNOSIS — K5909 Other constipation: Secondary | ICD-10-CM

## 2020-06-19 DIAGNOSIS — Z5189 Encounter for other specified aftercare: Secondary | ICD-10-CM | POA: Insufficient documentation

## 2020-06-19 DIAGNOSIS — Z170 Estrogen receptor positive status [ER+]: Secondary | ICD-10-CM | POA: Insufficient documentation

## 2020-06-19 LAB — CBC WITH DIFFERENTIAL/PLATELET
Abs Immature Granulocytes: 4.61 10*3/uL — ABNORMAL HIGH (ref 0.00–0.07)
Basophils Absolute: 0 10*3/uL (ref 0.0–0.1)
Basophils Relative: 0 %
Eosinophils Absolute: 0 10*3/uL (ref 0.0–0.5)
Eosinophils Relative: 0 %
HCT: 33.4 % — ABNORMAL LOW (ref 36.0–46.0)
Hemoglobin: 11.3 g/dL — ABNORMAL LOW (ref 12.0–15.0)
Immature Granulocytes: 22 %
Lymphocytes Relative: 11 %
Lymphs Abs: 2.3 10*3/uL (ref 0.7–4.0)
MCH: 29.4 pg (ref 26.0–34.0)
MCHC: 33.8 g/dL (ref 30.0–36.0)
MCV: 87 fL (ref 80.0–100.0)
Monocytes Absolute: 1.4 10*3/uL — ABNORMAL HIGH (ref 0.1–1.0)
Monocytes Relative: 7 %
Neutro Abs: 12.4 10*3/uL — ABNORMAL HIGH (ref 1.7–7.7)
Neutrophils Relative %: 60 %
Platelets: 180 10*3/uL (ref 150–400)
RBC: 3.84 MIL/uL — ABNORMAL LOW (ref 3.87–5.11)
RDW: 12.6 % (ref 11.5–15.5)
Smear Review: NORMAL
WBC: 20.8 10*3/uL — ABNORMAL HIGH (ref 4.0–10.5)
nRBC: 0.1 % (ref 0.0–0.2)

## 2020-06-19 LAB — COMPREHENSIVE METABOLIC PANEL
ALT: 28 U/L (ref 0–44)
AST: 40 U/L (ref 15–41)
Albumin: 3.6 g/dL (ref 3.5–5.0)
Alkaline Phosphatase: 80 U/L (ref 38–126)
Anion gap: 10 (ref 5–15)
BUN: 9 mg/dL (ref 6–20)
CO2: 23 mmol/L (ref 22–32)
Calcium: 8.7 mg/dL — ABNORMAL LOW (ref 8.9–10.3)
Chloride: 104 mmol/L (ref 98–111)
Creatinine, Ser: 0.78 mg/dL (ref 0.44–1.00)
GFR calc Af Amer: >60 mL/min (ref >60)
GFR calc non Af Amer: >60 mL/min (ref >60)
Glucose, Bld: 150 mg/dL — ABNORMAL HIGH (ref 70–99)
Potassium: 3.9 mmol/L (ref 3.5–5.1)
Sodium: 137 mmol/L (ref 135–145)
Total Bilirubin: 0.4 mg/dL (ref 0.3–1.2)
Total Protein: 6.3 g/dL — ABNORMAL LOW (ref 6.5–8.1)

## 2020-06-19 LAB — URINALYSIS, COMPLETE (UACMP) WITH MICROSCOPIC
Bacteria, UA: NONE SEEN
Bilirubin Urine: NEGATIVE
Glucose, UA: NEGATIVE mg/dL
Hgb urine dipstick: NEGATIVE
Ketones, ur: NEGATIVE mg/dL
Leukocytes,Ua: NEGATIVE
Nitrite: NEGATIVE
Protein, ur: NEGATIVE mg/dL
Specific Gravity, Urine: 1.005 (ref 1.005–1.030)
pH: 6 (ref 5.0–8.0)

## 2020-06-19 MED ORDER — SODIUM CHLORIDE 0.9 % IV SOLN
INTRAVENOUS | Status: AC
  Filled 2020-06-19 (×2): qty 250

## 2020-06-19 MED ORDER — SODIUM CHLORIDE 0.9% FLUSH
10.0000 mL | Freq: Once | INTRAVENOUS | Status: AC
  Administered 2020-06-19: 10 mL via INTRAVENOUS
  Filled 2020-06-19: qty 10

## 2020-06-19 MED ORDER — PROMETHAZINE HCL 25 MG PO TABS: 12.5000 mg | ORAL_TABLET | Freq: Four times a day (QID) | ORAL | 0 refills | Status: DC | PRN

## 2020-06-19 MED ORDER — HEPARIN SOD (PORK) LOCK FLUSH 100 UNIT/ML IV SOLN
500.0000 [IU] | Freq: Once | INTRAVENOUS | Status: AC
  Administered 2020-06-19: 500 [IU] via INTRAVENOUS
  Filled 2020-06-19: qty 5
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
## 2020-06-26 ENCOUNTER — Inpatient Hospital Stay (HOSPITAL_BASED_OUTPATIENT_CLINIC_OR_DEPARTMENT_OTHER): Payer: Managed Care, Other (non HMO) | Admitting: Hospice and Palliative Medicine

## 2020-06-26 ENCOUNTER — Other Ambulatory Visit: Payer: Self-pay

## 2020-06-26 DIAGNOSIS — F32A Depression, unspecified: Secondary | ICD-10-CM

## 2020-06-26 DIAGNOSIS — Z170 Estrogen receptor positive status [ER+]: Secondary | ICD-10-CM

## 2020-06-26 DIAGNOSIS — Z515 Encounter for palliative care: Secondary | ICD-10-CM | POA: Diagnosis not present

## 2020-06-26 DIAGNOSIS — C50411 Malignant neoplasm of upper-outer quadrant of right female breast: Secondary | ICD-10-CM

## 2020-06-26 DIAGNOSIS — G4700 Insomnia, unspecified: Secondary | ICD-10-CM

## 2020-06-26 DIAGNOSIS — F419 Anxiety disorder, unspecified: Secondary | ICD-10-CM | POA: Diagnosis not present

## 2020-06-26 DIAGNOSIS — F329 Major depressive disorder, single episode, unspecified: Secondary | ICD-10-CM

## 2020-06-26 MED ORDER — ALPRAZOLAM 0.5 MG PO TABS
0.5000 mg | ORAL_TABLET | Freq: Three times a day (TID) | ORAL | 0 refills | Status: DC | PRN
Start: 2020-06-26 — End: 2020-09-25

## 2020-06-26 MED ORDER — TRAZODONE HCL 50 MG PO TABS: 50.0000 mg | ORAL_TABLET | Freq: Every evening | ORAL | 2 refills | Status: DC | PRN

## 2020-06-26 NOTE — Progress Notes (Signed)
Virtual Visit via Video Note  I connected with [name] on 06/26/20 at 10:30 AM EDT by video and verified that I am speaking with the correct person using two identifiers.   I discussed the limitations, risks, security and privacy concerns of performing an evaluation and management service by telephone and the availability of in person appointments. I also discussed with the patient that there may be a patient responsible charge related to this service. The patient expressed understanding and agreed to proceed.   History of Present Illness: [name] is a 39 year old postmenopausal woman who has had a palpable right breast mass for about a year. She underwent MRI abdomen May 2021 which showed right breast hyperenhancement. Patient ultimately underwent biopsy revealing invasive mammary carcinoma. She is s/p lumpectomy and sentinel node biopsy and pending chemotx.    Observations/Objective: Patient reports that she has felt jittery recently.  I note that she shared this complaint with Dr. Janese Banks who dose reduced her dexamethasone.  Patient was also advised that she could take the alprazolam 3 times daily to see if it helps her anxiety.  Patient has not yet done that.  Additionally, patient says that she often feels jittery forcing in the morning when she wakes and that it improves after she drinks a Boost.  I question if patient could be having a.m. hypoglycemia although patient does not have a history of diabetes and is not on hyperglycemics.  Discussed checking a.m. CBG.  Patient also endorses worse insomnia.  She has been taking the dexamethasone right before bedtime and encouraged her to take it earlier in the evening.  It is okay for patient to increase trazodone 100 mg nightly if needed.  Patient says that she has had some depressive thoughts recently related to her hair loss.  However, she reports coping adequately.  Assessment and Plan: Breast cancer -on systemic  chemotherapy.  Patient is followed by Dr. Janese Banks.  Depression -continue Zoloft 100 mg daily  Anxiety -continue alprazolam 0.5 mg 3 times daily as needed  Insomnia -Increase trazodone 50-100 mg nightly as needed  Case and plan discussed with Dr. Janese Banks  Follow Up Instructions: Follow-up virtual visit 1-2 months   I discussed the assessment and treatment plan with the patient. The patient was provided an opportunity to ask questions and all were answered. The patient agreed with the plan and demonstrated an understanding of the instructions.   The patient was advised to call back or seek an in-person evaluation if the symptoms worsen or if the condition fails to improve as anticipated.  I provided 15 minutes of non-face-to-face time during this encounter.   [name], NP

## 2020-07-02 ENCOUNTER — Encounter: Payer: Self-pay | Admitting: *Deleted

## 2020-07-02 ENCOUNTER — Inpatient Hospital Stay (HOSPITAL_BASED_OUTPATIENT_CLINIC_OR_DEPARTMENT_OTHER): Payer: Managed Care, Other (non HMO) | Admitting: Oncology

## 2020-07-02 ENCOUNTER — Inpatient Hospital Stay: Payer: Managed Care, Other (non HMO)

## 2020-07-02 ENCOUNTER — Other Ambulatory Visit: Payer: Self-pay

## 2020-07-02 ENCOUNTER — Encounter: Payer: Self-pay | Admitting: Oncology

## 2020-07-02 ENCOUNTER — Other Ambulatory Visit: Payer: Self-pay | Admitting: *Deleted

## 2020-07-02 VITALS — BP 98/42 | HR 73 | Temp 98.5°F | Resp 16 | Ht 67.5 in | Wt 203.9 lb

## 2020-07-02 DIAGNOSIS — Z170 Estrogen receptor positive status [ER+]: Secondary | ICD-10-CM | POA: Diagnosis not present

## 2020-07-02 DIAGNOSIS — Z5111 Encounter for antineoplastic chemotherapy: Secondary | ICD-10-CM | POA: Diagnosis not present

## 2020-07-02 DIAGNOSIS — R110 Nausea: Secondary | ICD-10-CM | POA: Diagnosis not present

## 2020-07-02 DIAGNOSIS — C50411 Malignant neoplasm of upper-outer quadrant of right female breast: Secondary | ICD-10-CM | POA: Diagnosis not present

## 2020-07-02 DIAGNOSIS — T451X5A Adverse effect of antineoplastic and immunosuppressive drugs, initial encounter: Secondary | ICD-10-CM

## 2020-07-02 LAB — COMPREHENSIVE METABOLIC PANEL
ALT: 25 U/L (ref 0–44)
AST: 28 U/L (ref 15–41)
Albumin: 4.1 g/dL (ref 3.5–5.0)
Alkaline Phosphatase: 59 U/L (ref 38–126)
Anion gap: 7 (ref 5–15)
BUN: 16 mg/dL (ref 6–20)
CO2: 23 mmol/L (ref 22–32)
Calcium: 9.5 mg/dL (ref 8.9–10.3)
Chloride: 107 mmol/L (ref 98–111)
Creatinine, Ser: 0.6 mg/dL (ref 0.44–1.00)
GFR calc Af Amer: >60 mL/min (ref >60)
GFR calc non Af Amer: >60 mL/min (ref >60)
Glucose, Bld: 103 mg/dL — ABNORMAL HIGH (ref 70–99)
Potassium: 3.8 mmol/L (ref 3.5–5.1)
Sodium: 137 mmol/L (ref 135–145)
Total Bilirubin: 0.6 mg/dL (ref 0.3–1.2)
Total Protein: 7.3 g/dL (ref 6.5–8.1)

## 2020-07-02 LAB — CBC WITH DIFFERENTIAL/PLATELET
Abs Immature Granulocytes: 0.02 10*3/uL (ref 0.00–0.07)
Basophils Absolute: 0 10*3/uL (ref 0.0–0.1)
Basophils Relative: 1 %
Eosinophils Absolute: 0 10*3/uL (ref 0.0–0.5)
Eosinophils Relative: 0 %
HCT: 32.2 % — ABNORMAL LOW (ref 36.0–46.0)
Hemoglobin: 11.2 g/dL — ABNORMAL LOW (ref 12.0–15.0)
Immature Granulocytes: 0 %
Lymphocytes Relative: 25 %
Lymphs Abs: 2 10*3/uL (ref 0.7–4.0)
MCH: 29.9 pg (ref 26.0–34.0)
MCHC: 34.8 g/dL (ref 30.0–36.0)
MCV: 85.9 fL (ref 80.0–100.0)
Monocytes Absolute: 0.7 10*3/uL (ref 0.1–1.0)
Monocytes Relative: 9 %
Neutro Abs: 5.2 10*3/uL (ref 1.7–7.7)
Neutrophils Relative %: 65 %
Platelets: 350 10*3/uL (ref 150–400)
RBC: 3.75 MIL/uL — ABNORMAL LOW (ref 3.87–5.11)
RDW: 13.5 % (ref 11.5–15.5)
WBC: 8 10*3/uL (ref 4.0–10.5)
nRBC: 0 % (ref 0.0–0.2)

## 2020-07-02 MED ORDER — CELECOXIB 200 MG PO CAPS: 200.0000 mg | ORAL_CAPSULE | Freq: Two times a day (BID) | ORAL | 1 refills | Status: AC

## 2020-07-02 MED ORDER — OLANZAPINE 10 MG PO TABS: 10.0000 mg | ORAL_TABLET | Freq: Every day | ORAL | 0 refills | Status: DC

## 2020-07-02 MED ORDER — HEPARIN SOD (PORK) LOCK FLUSH 100 UNIT/ML IV SOLN
500.0000 [IU] | Freq: Once | INTRAVENOUS | Status: AC
  Administered 2020-07-02: 500 [IU] via INTRAVENOUS
  Filled 2020-07-02: qty 5

## 2020-07-02 MED ORDER — HEPARIN SOD (PORK) LOCK FLUSH 100 UNIT/ML IV SOLN
500.0000 [IU] | Freq: Once | INTRAVENOUS | Status: DC | PRN
  Filled 2020-07-02: qty 5

## 2020-07-02 MED ORDER — SODIUM CHLORIDE 0.9 % IV SOLN
75.0000 mg/m2 | Freq: Once | INTRAVENOUS | Status: AC
  Administered 2020-07-02: 160 mg via INTRAVENOUS
  Filled 2020-07-02: qty 16

## 2020-07-02 MED ORDER — SODIUM CHLORIDE 0.9 % IV SOLN
600.0000 mg/m2 | Freq: Once | INTRAVENOUS | Status: AC
  Administered 2020-07-02: 1280 mg via INTRAVENOUS
  Filled 2020-07-02: qty 50

## 2020-07-02 MED ORDER — SODIUM CHLORIDE 0.9 % IV SOLN
Freq: Once | INTRAVENOUS | Status: AC
  Filled 2020-07-02: qty 250

## 2020-07-02 MED ORDER — PALONOSETRON HCL INJECTION 0.25 MG/5ML
0.2500 mg | Freq: Once | INTRAVENOUS | Status: AC
  Administered 2020-07-02: 0.25 mg via INTRAVENOUS
  Filled 2020-07-02: qty 5

## 2020-07-02 MED ORDER — PEGFILGRASTIM 6 MG/0.6ML ~~LOC~~ PSKT
6.0000 mg | PREFILLED_SYRINGE | Freq: Once | SUBCUTANEOUS | Status: AC
  Administered 2020-07-02: 6 mg via SUBCUTANEOUS
  Filled 2020-07-02: qty 0.6

## 2020-07-02 MED ORDER — SODIUM CHLORIDE 0.9% FLUSH
10.0000 mL | INTRAVENOUS | Status: DC | PRN
  Administered 2020-07-02: 10 mL via INTRAVENOUS
  Filled 2020-07-02: qty 10

## 2020-07-02 MED ORDER — HEPARIN SOD (PORK) LOCK FLUSH 100 UNIT/ML IV SOLN
INTRAVENOUS | Status: AC
  Filled 2020-07-02: qty 5

## 2020-07-02 MED ORDER — SODIUM CHLORIDE 0.9 % IV SOLN
10.0000 mg | Freq: Once | INTRAVENOUS | Status: AC
  Administered 2020-07-02: 10 mg via INTRAVENOUS
  Filled 2020-07-02: qty 10

## 2020-07-02 NOTE — Progress Notes (Signed)
Pt is jittery every day when she wakes up. She drinks a boost and 1 hour later she is fine. She spoke to [facility] with palliative care and he feels it amy be low sugar and suggest monitor. She also needs a refill of celebrex for her pain. When she takes ibuprofen it upsets her stomach. She has constipation at times and now has added colace every day and then dulcolax every 3 days bid if no BM. Her appetite is not good but it is her usual eating prior to getting breast cancer. 1 good meal and 2 snacks a day with boost drinks 1 a day.

## 2020-07-02 NOTE — Progress Notes (Signed)
error 
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
## 2020-07-16 ENCOUNTER — Encounter: Payer: Self-pay | Admitting: Oncology

## 2020-07-17 ENCOUNTER — Encounter: Payer: Self-pay | Admitting: *Deleted

## 2020-07-17 ENCOUNTER — Other Ambulatory Visit: Payer: Self-pay | Admitting: *Deleted

## 2020-07-17 MED ORDER — OXYCODONE HCL 5 MG PO TABS: 5.0000 mg | ORAL_TABLET | ORAL | 0 refills | Status: DC | PRN

## 2020-07-22 ENCOUNTER — Encounter: Payer: Self-pay | Admitting: Oncology

## 2020-07-22 NOTE — Progress Notes (Signed)
Pt states that her pain is off and on. She has body aches and it tends to be at night. She has been taking oxycodone at night to help joint pains. She states 1-2 days before it's time to get treatment she feels like she gets anxious and gets nauseated and yesterday it was really bad. She did take nausea med and it got better. She does vape and she states that she has Delta 8-CBD and she vapes it once a week to help her with nausea.

## 2020-07-23 ENCOUNTER — Inpatient Hospital Stay: Payer: Medicaid Other

## 2020-07-23 ENCOUNTER — Encounter: Payer: Self-pay | Admitting: Oncology

## 2020-07-23 ENCOUNTER — Other Ambulatory Visit: Payer: Self-pay

## 2020-07-23 ENCOUNTER — Inpatient Hospital Stay (HOSPITAL_BASED_OUTPATIENT_CLINIC_OR_DEPARTMENT_OTHER): Payer: Medicaid Other | Admitting: Oncology

## 2020-07-23 ENCOUNTER — Inpatient Hospital Stay: Payer: Medicaid Other | Attending: Oncology

## 2020-07-23 VITALS — BP 131/79 | HR 94 | Temp 98.0°F | Resp 20 | Wt 207.3 lb

## 2020-07-23 DIAGNOSIS — F32A Depression, unspecified: Secondary | ICD-10-CM | POA: Insufficient documentation

## 2020-07-23 DIAGNOSIS — Z5111 Encounter for antineoplastic chemotherapy: Secondary | ICD-10-CM | POA: Diagnosis not present

## 2020-07-23 DIAGNOSIS — Z170 Estrogen receptor positive status [ER+]: Secondary | ICD-10-CM

## 2020-07-23 DIAGNOSIS — C50411 Malignant neoplasm of upper-outer quadrant of right female breast: Secondary | ICD-10-CM

## 2020-07-23 DIAGNOSIS — F419 Anxiety disorder, unspecified: Secondary | ICD-10-CM

## 2020-07-23 DIAGNOSIS — D6481 Anemia due to antineoplastic chemotherapy: Secondary | ICD-10-CM | POA: Insufficient documentation

## 2020-07-23 DIAGNOSIS — Z79899 Other long term (current) drug therapy: Secondary | ICD-10-CM | POA: Insufficient documentation

## 2020-07-23 DIAGNOSIS — T451X5A Adverse effect of antineoplastic and immunosuppressive drugs, initial encounter: Secondary | ICD-10-CM

## 2020-07-23 LAB — CBC WITH DIFFERENTIAL/PLATELET
Abs Immature Granulocytes: 0.11 10*3/uL — ABNORMAL HIGH (ref 0.00–0.07)
Basophils Absolute: 0 10*3/uL (ref 0.0–0.1)
Basophils Relative: 0 %
Eosinophils Absolute: 0 10*3/uL (ref 0.0–0.5)
Eosinophils Relative: 0 %
HCT: 31.1 % — ABNORMAL LOW (ref 36.0–46.0)
Hemoglobin: 10.7 g/dL — ABNORMAL LOW (ref 12.0–15.0)
Immature Granulocytes: 1 %
Lymphocytes Relative: 14 %
Lymphs Abs: 2 10*3/uL (ref 0.7–4.0)
MCH: 29.8 pg (ref 26.0–34.0)
MCHC: 34.4 g/dL (ref 30.0–36.0)
MCV: 86.6 fL (ref 80.0–100.0)
Monocytes Absolute: 1.2 10*3/uL — ABNORMAL HIGH (ref 0.1–1.0)
Monocytes Relative: 8 %
Neutro Abs: 11.1 10*3/uL — ABNORMAL HIGH (ref 1.7–7.7)
Neutrophils Relative %: 77 %
Platelets: 348 10*3/uL (ref 150–400)
RBC: 3.59 MIL/uL — ABNORMAL LOW (ref 3.87–5.11)
RDW: 15.8 % — ABNORMAL HIGH (ref 11.5–15.5)
WBC: 14.3 10*3/uL — ABNORMAL HIGH (ref 4.0–10.5)
nRBC: 0 % (ref 0.0–0.2)

## 2020-07-23 LAB — COMPREHENSIVE METABOLIC PANEL
ALT: 36 U/L (ref 0–44)
AST: 35 U/L (ref 15–41)
Albumin: 4.2 g/dL (ref 3.5–5.0)
Alkaline Phosphatase: 67 U/L (ref 38–126)
Anion gap: 9 (ref 5–15)
BUN: 19 mg/dL (ref 6–20)
CO2: 22 mmol/L (ref 22–32)
Calcium: 9.8 mg/dL (ref 8.9–10.3)
Chloride: 108 mmol/L (ref 98–111)
Creatinine, Ser: 0.61 mg/dL (ref 0.44–1.00)
GFR calc non Af Amer: >60 mL/min (ref >60)
Glucose, Bld: 112 mg/dL — ABNORMAL HIGH (ref 70–99)
Potassium: 3.9 mmol/L (ref 3.5–5.1)
Sodium: 139 mmol/L (ref 135–145)
Total Bilirubin: 0.6 mg/dL (ref 0.3–1.2)
Total Protein: 7.6 g/dL (ref 6.5–8.1)

## 2020-07-23 MED ORDER — SODIUM CHLORIDE 0.9 % IV SOLN
Freq: Once | INTRAVENOUS | Status: AC
  Filled 2020-07-23: qty 250

## 2020-07-23 MED ORDER — HEPARIN SOD (PORK) LOCK FLUSH 100 UNIT/ML IV SOLN
500.0000 [IU] | Freq: Once | INTRAVENOUS | Status: AC | PRN
  Administered 2020-07-23: 500 [IU]
  Filled 2020-07-23: qty 5

## 2020-07-23 MED ORDER — SODIUM CHLORIDE 0.9 % IV SOLN
10.0000 mg | Freq: Once | INTRAVENOUS | Status: AC
  Administered 2020-07-23: 10 mg via INTRAVENOUS
  Filled 2020-07-23: qty 10

## 2020-07-23 MED ORDER — HEPARIN SOD (PORK) LOCK FLUSH 100 UNIT/ML IV SOLN
INTRAVENOUS | Status: AC
  Filled 2020-07-23: qty 5

## 2020-07-23 MED ORDER — OLANZAPINE 10 MG PO TABS: 10.0000 mg | ORAL_TABLET | Freq: Every day | ORAL | 0 refills | Status: DC

## 2020-07-23 MED ORDER — SODIUM CHLORIDE 0.9 % IV SOLN
75.0000 mg/m2 | Freq: Once | INTRAVENOUS | Status: AC
  Administered 2020-07-23: 160 mg via INTRAVENOUS
  Filled 2020-07-23: qty 16

## 2020-07-23 MED ORDER — SODIUM CHLORIDE 0.9 % IV SOLN
600.0000 mg/m2 | Freq: Once | INTRAVENOUS | Status: AC
  Administered 2020-07-23: 1280 mg via INTRAVENOUS
  Filled 2020-07-23: qty 50

## 2020-07-23 MED ORDER — PALONOSETRON HCL INJECTION 0.25 MG/5ML
0.2500 mg | Freq: Once | INTRAVENOUS | Status: AC
  Administered 2020-07-23: 0.25 mg via INTRAVENOUS
  Filled 2020-07-23: qty 5

## 2020-07-23 MED ORDER — PEGFILGRASTIM 6 MG/0.6ML ~~LOC~~ PSKT
6.0000 mg | PREFILLED_SYRINGE | Freq: Once | SUBCUTANEOUS | Status: AC
  Administered 2020-07-23: 6 mg via SUBCUTANEOUS
  Filled 2020-07-23: qty 0.6
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
## 2020-08-04 ENCOUNTER — Encounter: Payer: Self-pay | Admitting: Plastic Surgery

## 2020-08-04 ENCOUNTER — Ambulatory Visit (INDEPENDENT_AMBULATORY_CARE_PROVIDER_SITE_OTHER): Payer: Medicaid Other | Admitting: Plastic Surgery

## 2020-08-04 ENCOUNTER — Other Ambulatory Visit: Payer: Self-pay

## 2020-08-04 VITALS — BP 117/79 | HR 101 | Temp 98.5°F | Ht 67.5 in | Wt 213.0 lb

## 2020-08-04 DIAGNOSIS — C50411 Malignant neoplasm of upper-outer quadrant of right female breast: Secondary | ICD-10-CM | POA: Diagnosis not present

## 2020-08-04 DIAGNOSIS — Z170 Estrogen receptor positive status [ER+]: Secondary | ICD-10-CM | POA: Diagnosis not present
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
## 2020-08-05 ENCOUNTER — Telehealth: Payer: Self-pay | Admitting: Plastic Surgery

## 2020-08-11 ENCOUNTER — Ambulatory Visit: Payer: Self-pay | Admitting: Surgery

## 2020-08-13 ENCOUNTER — Inpatient Hospital Stay (HOSPITAL_BASED_OUTPATIENT_CLINIC_OR_DEPARTMENT_OTHER): Payer: Medicaid Other | Admitting: Hospice and Palliative Medicine

## 2020-08-13 ENCOUNTER — Other Ambulatory Visit: Payer: Self-pay

## 2020-08-13 ENCOUNTER — Inpatient Hospital Stay: Payer: Medicaid Other

## 2020-08-13 ENCOUNTER — Other Ambulatory Visit: Payer: Self-pay | Admitting: *Deleted

## 2020-08-13 ENCOUNTER — Encounter: Payer: Self-pay | Admitting: Oncology

## 2020-08-13 ENCOUNTER — Inpatient Hospital Stay (HOSPITAL_BASED_OUTPATIENT_CLINIC_OR_DEPARTMENT_OTHER): Payer: Medicaid Other | Admitting: Oncology

## 2020-08-13 VITALS — BP 119/70 | HR 95 | Temp 98.6°F | Resp 16 | Ht 67.5 in | Wt 210.7 lb

## 2020-08-13 DIAGNOSIS — C50411 Malignant neoplasm of upper-outer quadrant of right female breast: Secondary | ICD-10-CM

## 2020-08-13 DIAGNOSIS — Z170 Estrogen receptor positive status [ER+]: Secondary | ICD-10-CM

## 2020-08-13 DIAGNOSIS — F419 Anxiety disorder, unspecified: Secondary | ICD-10-CM | POA: Diagnosis not present

## 2020-08-13 DIAGNOSIS — Z515 Encounter for palliative care: Secondary | ICD-10-CM

## 2020-08-13 DIAGNOSIS — F331 Major depressive disorder, recurrent, moderate: Secondary | ICD-10-CM

## 2020-08-13 DIAGNOSIS — Z5111 Encounter for antineoplastic chemotherapy: Secondary | ICD-10-CM | POA: Diagnosis not present

## 2020-08-13 LAB — COMPREHENSIVE METABOLIC PANEL
ALT: 32 U/L (ref 0–44)
AST: 37 U/L (ref 15–41)
Albumin: 4.1 g/dL (ref 3.5–5.0)
Alkaline Phosphatase: 62 U/L (ref 38–126)
Anion gap: 8 (ref 5–15)
BUN: 14 mg/dL (ref 6–20)
CO2: 22 mmol/L (ref 22–32)
Calcium: 9.7 mg/dL (ref 8.9–10.3)
Chloride: 108 mmol/L (ref 98–111)
Creatinine, Ser: 0.58 mg/dL (ref 0.44–1.00)
GFR, Estimated: >60 mL/min (ref >60)
Glucose, Bld: 133 mg/dL — ABNORMAL HIGH (ref 70–99)
Potassium: 3.9 mmol/L (ref 3.5–5.1)
Sodium: 138 mmol/L (ref 135–145)
Total Bilirubin: 0.4 mg/dL (ref 0.3–1.2)
Total Protein: 7.3 g/dL (ref 6.5–8.1)

## 2020-08-13 LAB — CBC WITH DIFFERENTIAL/PLATELET
Abs Immature Granulocytes: 0.05 10*3/uL (ref 0.00–0.07)
Basophils Absolute: 0 10*3/uL (ref 0.0–0.1)
Basophils Relative: 0 %
Eosinophils Absolute: 0 10*3/uL (ref 0.0–0.5)
Eosinophils Relative: 0 %
HCT: 30.4 % — ABNORMAL LOW (ref 36.0–46.0)
Hemoglobin: 10.2 g/dL — ABNORMAL LOW (ref 12.0–15.0)
Immature Granulocytes: 1 %
Lymphocytes Relative: 14 %
Lymphs Abs: 1.4 10*3/uL (ref 0.7–4.0)
MCH: 30.2 pg (ref 26.0–34.0)
MCHC: 33.6 g/dL (ref 30.0–36.0)
MCV: 89.9 fL (ref 80.0–100.0)
Monocytes Absolute: 0.6 10*3/uL (ref 0.1–1.0)
Monocytes Relative: 6 %
Neutro Abs: 7.9 10*3/uL — ABNORMAL HIGH (ref 1.7–7.7)
Neutrophils Relative %: 79 %
Platelets: 295 10*3/uL (ref 150–400)
RBC: 3.38 MIL/uL — ABNORMAL LOW (ref 3.87–5.11)
RDW: 17.3 % — ABNORMAL HIGH (ref 11.5–15.5)
WBC: 10 10*3/uL (ref 4.0–10.5)
nRBC: 0 % (ref 0.0–0.2)

## 2020-08-13 MED ORDER — SODIUM CHLORIDE 0.9% FLUSH
10.0000 mL | INTRAVENOUS | Status: DC | PRN
  Administered 2020-08-13: 10 mL
  Filled 2020-08-13: qty 10

## 2020-08-13 MED ORDER — PALONOSETRON HCL INJECTION 0.25 MG/5ML: 0.2500 mg | Freq: Once | INTRAVENOUS | Status: DC

## 2020-08-13 MED ORDER — OXYCODONE HCL 5 MG PO TABS
5.0000 mg | ORAL_TABLET | ORAL | 0 refills | Status: DC | PRN
Start: 2020-08-13 — End: 2020-09-15

## 2020-08-13 MED ORDER — SODIUM CHLORIDE 0.9 % IV SOLN
600.0000 mg/m2 | Freq: Once | INTRAVENOUS | Status: AC
  Administered 2020-08-13: 1280 mg via INTRAVENOUS
  Filled 2020-08-13: qty 50

## 2020-08-13 MED ORDER — SODIUM CHLORIDE 0.9 % IV SOLN
10.0000 mg | Freq: Once | INTRAVENOUS | Status: DC
  Filled 2020-08-13: qty 1

## 2020-08-13 MED ORDER — SODIUM CHLORIDE 0.9 % IV SOLN
75.0000 mg/m2 | Freq: Once | INTRAVENOUS | Status: DC
  Filled 2020-08-13: qty 16

## 2020-08-13 MED ORDER — SODIUM CHLORIDE 0.9 % IV SOLN
600.0000 mg/m2 | Freq: Once | INTRAVENOUS | Status: DC
  Filled 2020-08-13: qty 64

## 2020-08-13 MED ORDER — SODIUM CHLORIDE 0.9 % IV SOLN
10.0000 mg | Freq: Once | INTRAVENOUS | Status: AC
  Administered 2020-08-13: 10 mg via INTRAVENOUS
  Filled 2020-08-13: qty 10

## 2020-08-13 MED ORDER — SODIUM CHLORIDE 0.9 % IV SOLN
75.0000 mg/m2 | Freq: Once | INTRAVENOUS | Status: AC
  Administered 2020-08-13: 160 mg via INTRAVENOUS
  Filled 2020-08-13: qty 16

## 2020-08-13 MED ORDER — PEGFILGRASTIM 6 MG/0.6ML ~~LOC~~ PSKT: 6.0000 mg | PREFILLED_SYRINGE | Freq: Once | SUBCUTANEOUS | Status: DC

## 2020-08-13 MED ORDER — SODIUM CHLORIDE 0.9 % IV SOLN
Freq: Once | INTRAVENOUS | Status: AC
  Filled 2020-08-13: qty 250

## 2020-08-13 MED ORDER — HEPARIN SOD (PORK) LOCK FLUSH 100 UNIT/ML IV SOLN
500.0000 [IU] | Freq: Once | INTRAVENOUS | Status: AC | PRN
  Administered 2020-08-13: 500 [IU]
  Filled 2020-08-13: qty 5

## 2020-08-13 MED ORDER — PALONOSETRON HCL INJECTION 0.25 MG/5ML
0.2500 mg | Freq: Once | INTRAVENOUS | Status: AC
  Administered 2020-08-13: 0.25 mg via INTRAVENOUS
  Filled 2020-08-13: qty 5

## 2020-08-13 MED ORDER — HEPARIN SOD (PORK) LOCK FLUSH 100 UNIT/ML IV SOLN
INTRAVENOUS | Status: AC
  Filled 2020-08-13: qty 5

## 2020-08-13 MED ORDER — PEGFILGRASTIM 6 MG/0.6ML ~~LOC~~ PSKT
6.0000 mg | PREFILLED_SYRINGE | Freq: Once | SUBCUTANEOUS | Status: AC
  Administered 2020-08-13: 6 mg via SUBCUTANEOUS
  Filled 2020-08-13: qty 0.6

## 2020-08-13 NOTE — Progress Notes (Signed)
Pt has bad pain with neulasta. She has a few pills but needs refill of pain med. She drinks 1-2 ensure a day, 1-2 bottles of water, several apple juice. She snacks 1 time and eats lunch only. This was her routine before getting dx. With cancer and start having treatments. Bowels are good. She is anxious about treatment and that she has not heard from her long term asst with work. She has holidays coming up and stressed about being able to pay for gifts for her kids
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
## 2020-08-14 ENCOUNTER — Other Ambulatory Visit: Payer: Self-pay | Admitting: General Surgery

## 2020-08-14 DIAGNOSIS — C50919 Malignant neoplasm of unspecified site of unspecified female breast: Secondary | ICD-10-CM
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
## 2020-08-17 ENCOUNTER — Ambulatory Visit: Payer: Self-pay | Admitting: Surgery

## 2020-08-17 NOTE — H&P (View-Only) (Signed)
Subjective:   CC: Malignant neoplasm of lower-outer quadrant of right breast of female, estrogen receptor positive (CMS-HCC) [C50.511, Z17.0] POSTOP  HPI:  [name] is a 39 y.o. female who is here for followup from above.  Drainage has been consistently below 46ml/day since last exam.  No pain.   Current Medications: has a current medication list which includes the following prescription(s): albuterol, alprazolam, celecoxib, dexamethasone, epinephrine, ibuprofen, ondansetron, prochlorperazine, promethazine, sertraline, stool softener, and trazodone.  Allergies:      Allergies  Allergen Reactions  . Venom-Honey Bee Anaphylaxis  . Latex Unknown  . Penicillin Unknown  . Shellfish Containing Products Hives    ROS: General: Denies weight loss, weight gain, fatigue, fevers, chills, and night sweats. Heart: Denies chest pain, palpitations, racing heart, irregular heartbeat, leg pain or swelling, and decreased activity tolerance. Respiratory: Denies breathing difficulty, shortness of breath, wheezing, cough, and sputum. GI: Denies change in appetite, heartburn, nausea, vomiting, constipation, diarrhea, and blood in stool. GU: Denies difficulty urinating, pain with urinating, urgency, frequency, blood in urine    Objective:   BP 128/84   Pulse 86   Ht 170.2 cm (5\' 7" )   Wt 95.3 kg (210 lb)   BMI 32.89 kg/m   Constitutional :  alert, appears stated age, cooperative and no distress  Gastrointestinal: soft, non-tender; bowel sounds normal; no masses,  no organomegaly.    Musculoskeletal: Steady gait and movement  Skin: Cool and moist, incisions clean, dry, intact.  No erythema, induration or drainage to indicate infection.  JP with scant serous fluid.  Psychiatric: Normal affect, non-agitated, not confused       LABS:  SURGICAL PATHOLOGY SURGICAL PATHOLOGY  CASE: [id number]  PATIENT: [name]  Surgical Pathology Report      Specimen Submitted:  A.  Breast, lateral margin  B. Anterior margin   Clinical History: C50.511, Z17.0 malignant neoplasm of lower-outer  quadrant of RT breast of female, estrogen receptor positive       DIAGNOSIS:  A. BREAST, LATERAL MARGIN; EXCISION:  - RESIDUAL INVASIVE MAMMARY CARCINOMA INVOLVING NEW INFERIOR MARGIN.  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  - PRIOR SURGICAL SITE CHANGE.   B. BREAST, ANTERIOR MARGIN; EXCISION:  - RESIDUAL INVASIVE MAMMARY CARCINOMA, 0.9 MM TO CLOSEST NEW INFERIOR  MARGIN.  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  - PRIOR SURGICAL SITE CHANGE.   Comment:  In the lateral margin re-excision (part A) residual DCIS is present in  the inferior half of the specimen while residual invasive carcinoma in  presentin the inferior third of the specimen. Both invasive carcinoma  and DCIS involve the inferior margin of the specimen. DCIS also involves  the new lateral margin. The largest focus of invasive carcinoma measures  5 mm.   In the anterior margin re-excision (part B) residual invasive carcinoma  and DCIS is present in the lateral half of the specimen. The largest  linear focus of invasive carcinoma measures 4 mm. Biopsy site change is  focally noted along the inferior (blue), superior (red), posterior  (black), medial (yellow), lateral (orange), and anterior (green) margins  which makes the determination of final margin status difficult.    GROSS DESCRIPTION:  Intraoperative Consultation:    Labeled: Lateral margin    Received: Fresh    Specimen: Reexcision of breast lumpectomy    Pathologic evaluation performed: Gross  margin evaluation    Diagnosis: IOC: No discrete mass identified, Note: Grid on TranSpec  container not touching specimen.    Communicated to: Called to Dr. [name] at 10:04 AM on 05/28/2020 Dellia Nims. Rubinas M.D.    Tissue submitted: None   A. Labeled: Lateral margin  Received: Fresh  Specimen radiograph image(s) available for review  Radiographic findings: A radiograph is not provided. Additionally the  specimen container (TranSpec) has a clear mesh grid which is not firmly  placed against the specimen. Thus the specimen has shifted within the  container and would make comparison with an image difficult.  Time in fixative: Collected at 9:51 AM on 05/28/2020 and placed in  formalin at 10:09 AM on 05/28/2020  Cold ischemic time: Less than 30 minutes  Total fixation time: 11 hours  Type of procedure: Reexcision of breast lumpectomy  Location / laterality of specimen: Right breast  Orientation of specimen: The specimen is received oriented and inked.  Inking:  Anterior = green  Inferior = blue  Lateral = orange  Medial = yellow  Posterior = black  Superior = red  Size of specimen: 4.9 (superior to inferior) x 3 (anterior to posterior)  x 1.2 (medial to lateral) cm  Skin: None grossly appreciated   Biopsy site: None grossly appreciated  Number of discrete masses: None grossly appreciated  Description of tissue: The specimen is comprised of yellow lobulated  adipose tissue with a scant amount of tan-white fine fibrous tissue.  Adjacent to the medial margin are areas of tan-white firm fibrous  tissue. No distinct masses or lesions are grossly identified. The fat  to fibrous tissue ratio is 95:5.   Block summary (specimen entirely submitted):  1 - superior margin, perpendicularly sectioned  2 - 9 - central sections submitted entirely and sequentially from  superior to inferior  10 - 11 - inferior margin, perpendicularly sectioned   Intraoperative Consultation:    Labeled: Anterior margin    Received: Fresh    Specimen: Reexcision of breast lumpectomy    Pathologic evaluation performed: Gross margin evaluation    Diagnosis: IOC: No discrete mass  identified  Communicated to: Called to Dr. [name] at 10:15 AM on 05/28/2020 Baxter Flattery C.  Rubinas M.D.    Tissue submitted: None   B. Labeled: Anterior margin  Received: Fresh  Specimen radiograph image(s) available for review  Radiographic findings: A radiograph is not performed.  Time in fixative: Collected at 10:05 AM on 05/28/2020 and placed in  formalin at 10:15 AM on 05/28/2020  Cold ischemic time: Less than 15 minutes  Total fixation time: 11 hours  Type of procedure: Reexcision of breast lumpectomy  Location / laterality of specimen: Right breast  Orientation of specimen: The specimen is received inked and oriented.  Inking:  Anterior = green  Inferior = blue  Lateral = orange  Medial = yellow  Posterior = black  Superior = red  Size of specimen: 5.3 (medial to lateral) x 3.4 (superior to inferior) x  1.4 (anterior to posterior) cm  Skin: None grossly appreciated.   Biopsy site: None grossly appreciated.   Number of discrete masses: None grossly appreciated.  Description of tissue: The specimen is comprised of yellow lobulated  adipose tissue withtan-white fibrous tissue. No distinct masses or  lesions are grossly identified. The fat to fibrous tissue ratio is 40:  60   Block summary:  1 - medial margin, perpendicularly sectioned  2 - 12 - central sections submitted entirely  and sequentially from  medial to lateral  13 - 14 - lateral margin, perpendicularly sectioned   Final Diagnosis performed by Quay Burow, MD.  Electronically signed  06/01/2020 3:40:47PM  The electronic signature indicates that the named Attending Pathologist  has evaluated the specimen  Technical component performed at [place], [address], [place],  [place] [address] Lab: [phone] Dir: Rush Farmer, MD, MMM  Professional component performed at [facility], [place], [place], [place] [address] Lab: [phone]  Dir: Dellia Nims. Rubinas, MD       RADS: N/A  Assessment:      Malignant neoplasm of lower-outer quadrant of right breast of female, estrogen receptor positive (CMS-HCC) [C50.511, Z17.0] s/p re-excision of margins, still positive  Plan:   1. Healing well.  Seroma site sounds and looks like it has resolved.  Drain removed in office without any issues.  Re: positive margins, patient is considering mastectomy, possible nipple sparing, possible bilateral.  She now is considering just mastectomy.  Briefly discussed r/b/a again to above techniques and possible reconstruction

## 2020-08-17 NOTE — H&P (Signed)
Subjective:   CC: Malignant neoplasm of lower-outer quadrant of right breast of female, estrogen receptor positive (CMS-HCC) [C50.511, Z17.0] POSTOP  HPI:  [name] is a 39 y.o. female who is here for followup from above.  Drainage has been consistently below 43ml/day since last exam.  No pain.   Current Medications: has a current medication list which includes the following prescription(s): albuterol, alprazolam, celecoxib, dexamethasone, epinephrine, ibuprofen, ondansetron, prochlorperazine, promethazine, sertraline, stool softener, and trazodone.  Allergies:      Allergies  Allergen Reactions  . Venom-Honey Bee Anaphylaxis  . Latex Unknown  . Penicillin Unknown  . Shellfish Containing Products Hives    ROS: General: Denies weight loss, weight gain, fatigue, fevers, chills, and night sweats. Heart: Denies chest pain, palpitations, racing heart, irregular heartbeat, leg pain or swelling, and decreased activity tolerance. Respiratory: Denies breathing difficulty, shortness of breath, wheezing, cough, and sputum. GI: Denies change in appetite, heartburn, nausea, vomiting, constipation, diarrhea, and blood in stool. GU: Denies difficulty urinating, pain with urinating, urgency, frequency, blood in urine    Objective:   BP 128/84   Pulse 86   Ht 170.2 cm (5\' 7" )   Wt 95.3 kg (210 lb)   BMI 32.89 kg/m   Constitutional :  alert, appears stated age, cooperative and no distress  Gastrointestinal: soft, non-tender; bowel sounds normal; no masses,  no organomegaly.    Musculoskeletal: Steady gait and movement  Skin: Cool and moist, incisions clean, dry, intact.  No erythema, induration or drainage to indicate infection.  JP with scant serous fluid.  Psychiatric: Normal affect, non-agitated, not confused       LABS:  SURGICAL PATHOLOGY SURGICAL PATHOLOGY  CASE: [phone]  PATIENT: [name]  Surgical Pathology Report      Specimen Submitted:  A.  Breast, lateral margin  B. Anterior margin   Clinical History: C50.511, Z17.0 malignant neoplasm of lower-outer  quadrant of RT breast of female, estrogen receptor positive       DIAGNOSIS:  A. BREAST, LATERAL MARGIN; EXCISION:  - RESIDUAL INVASIVE MAMMARY CARCINOMA INVOLVING NEW INFERIOR MARGIN.  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  - PRIOR SURGICAL SITE CHANGE.   B. BREAST, ANTERIOR MARGIN; EXCISION:  - RESIDUAL INVASIVE MAMMARY CARCINOMA, 0.9 MM TO CLOSEST NEW INFERIOR  MARGIN.  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  - PRIOR SURGICAL SITE CHANGE.   Comment:  In the lateral margin re-excision (part A) residual DCIS is present in  the inferior half of the specimen while residual invasive carcinoma in  presentin the inferior third of the specimen. Both invasive carcinoma  and DCIS involve the inferior margin of the specimen. DCIS also involves  the new lateral margin. The largest focus of invasive carcinoma measures  5 mm.   In the anterior margin re-excision (part B) residual invasive carcinoma  and DCIS is present in the lateral half of the specimen. The largest  linear focus of invasive carcinoma measures 4 mm. Biopsy site change is  focally noted along the inferior (blue), superior (red), posterior  (black), medial (yellow), lateral (orange), and anterior (green) margins  which makes the determination of final margin status difficult.    GROSS DESCRIPTION:  Intraoperative Consultation:    Labeled: Lateral margin    Received: Fresh    Specimen: Reexcision of breast lumpectomy    Pathologic evaluation performed: Gross  margin evaluation    Diagnosis: IOC: No discrete mass identified, Note: Grid on TranSpec  container not touching specimen.    Communicated to: Called to Dr. [name] at 10:04 AM on 05/28/2020 Dellia Nims. Rubinas M.D.    Tissue submitted: None   A. Labeled: Lateral margin  Received: Fresh  Specimen radiograph image(s) available for review  Radiographic findings: A radiograph is not provided. Additionally the  specimen container (TranSpec) has a clear mesh grid which is not firmly  placed against the specimen. Thus the specimen has shifted within the  container and would make comparison with an image difficult.  Time in fixative: Collected at 9:51 AM on 05/28/2020 and placed in  formalin at 10:09 AM on 05/28/2020  Cold ischemic time: Less than 30 minutes  Total fixation time: 11 hours  Type of procedure: Reexcision of breast lumpectomy  Location / laterality of specimen: Right breast  Orientation of specimen: The specimen is received oriented and inked.  Inking:  Anterior = green  Inferior = blue  Lateral = orange  Medial = yellow  Posterior = black  Superior = red  Size of specimen: 4.9 (superior to inferior) x 3 (anterior to posterior)  x 1.2 (medial to lateral) cm  Skin: None grossly appreciated   Biopsy site: None grossly appreciated  Number of discrete masses: None grossly appreciated  Description of tissue: The specimen is comprised of yellow lobulated  adipose tissue with a scant amount of tan-white fine fibrous tissue.  Adjacent to the medial margin are areas of tan-white firm fibrous  tissue. No distinct masses or lesions are grossly identified. The fat  to fibrous tissue ratio is 95:5.   Block summary (specimen entirely submitted):  1 - superior margin, perpendicularly sectioned  2 - 9 - central sections submitted entirely and sequentially from  superior to inferior  10 - 11 - inferior margin, perpendicularly sectioned   Intraoperative Consultation:    Labeled: Anterior margin    Received: Fresh    Specimen: Reexcision of breast lumpectomy    Pathologic evaluation performed: Gross margin evaluation    Diagnosis: IOC: No discrete mass  identified  Communicated to: Called to Dr. [name] at 10:15 AM on 05/28/2020 Baxter Flattery C.  Rubinas M.D.    Tissue submitted: None   B. Labeled: Anterior margin  Received: Fresh  Specimen radiograph image(s) available for review  Radiographic findings: A radiograph is not performed.  Time in fixative: Collected at 10:05 AM on 05/28/2020 and placed in  formalin at 10:15 AM on 05/28/2020  Cold ischemic time: Less than 15 minutes  Total fixation time: 11 hours  Type of procedure: Reexcision of breast lumpectomy  Location / laterality of specimen: Right breast  Orientation of specimen: The specimen is received inked and oriented.  Inking:  Anterior = green  Inferior = blue  Lateral = orange  Medial = yellow  Posterior = black  Superior = red  Size of specimen: 5.3 (medial to lateral) x 3.4 (superior to inferior) x  1.4 (anterior to posterior) cm  Skin: None grossly appreciated.   Biopsy site: None grossly appreciated.   Number of discrete masses: None grossly appreciated.  Description of tissue: The specimen is comprised of yellow lobulated  adipose tissue withtan-white fibrous tissue. No distinct masses or  lesions are grossly identified. The fat to fibrous tissue ratio is 40:  60   Block summary:  1 - medial margin, perpendicularly sectioned  2 - 12 - central sections submitted entirely  and sequentially from  medial to lateral  13 - 14 - lateral margin, perpendicularly sectioned   Final Diagnosis performed by Quay Burow, MD.  Electronically signed  06/01/2020 3:40:47PM  The electronic signature indicates that the named Attending Pathologist  has evaluated the specimen  Technical component performed at [place], [address], [place],  [place] [address] Lab: [phone] Dir: Rush Farmer, MD, MMM  Professional component performed at [name], [facility], [place], [place], [place] [address] Lab: [phone]  Dir: Dellia Nims. Rubinas, MD       RADS: N/A  Assessment:      Malignant neoplasm of lower-outer quadrant of right breast of female, estrogen receptor positive (CMS-HCC) [C50.511, Z17.0] s/p re-excision of margins, still positive  Plan:   1. Healing well.  Seroma site sounds and looks like it has resolved.  Drain removed in office without any issues.  Re: positive margins, patient is considering mastectomy, possible nipple sparing, possible bilateral.  She now is considering just mastectomy.  Briefly discussed r/b/a again to above techniques and possible reconstruction

## 2020-08-20 ENCOUNTER — Other Ambulatory Visit: Payer: Self-pay | Admitting: Oncology

## 2020-08-27 ENCOUNTER — Encounter: Payer: Self-pay | Admitting: Surgical
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
## 2020-08-28 ENCOUNTER — Ambulatory Visit (INDEPENDENT_AMBULATORY_CARE_PROVIDER_SITE_OTHER): Payer: Medicaid Other | Admitting: Surgical

## 2020-08-28 ENCOUNTER — Other Ambulatory Visit: Payer: Self-pay

## 2020-08-28 ENCOUNTER — Encounter: Payer: Self-pay | Admitting: Surgical

## 2020-08-28 VITALS — BP 118/78 | HR 104 | Temp 98.2°F | Ht 67.5 in | Wt 211.2 lb

## 2020-08-28 DIAGNOSIS — Z170 Estrogen receptor positive status [ER+]: Secondary | ICD-10-CM

## 2020-08-28 DIAGNOSIS — C50411 Malignant neoplasm of upper-outer quadrant of right female breast: Secondary | ICD-10-CM

## 2020-08-28 MED ORDER — DIAZEPAM 2 MG PO TABS: 2.0000 mg | ORAL_TABLET | Freq: Two times a day (BID) | ORAL | 0 refills | Status: DC | PRN

## 2020-08-28 MED ORDER — HYDROCODONE-ACETAMINOPHEN 5-325 MG PO TABS: 1.0000 | ORAL_TABLET | Freq: Three times a day (TID) | ORAL | 0 refills | Status: DC | PRN

## 2020-08-28 MED ORDER — ONDANSETRON HCL 4 MG PO TABS: 4.0000 mg | ORAL_TABLET | Freq: Three times a day (TID) | ORAL | 0 refills | Status: DC | PRN

## 2020-08-28 MED ORDER — DOXYCYCLINE HYCLATE 100 MG PO TABS: 100.0000 mg | ORAL_TABLET | Freq: Two times a day (BID) | ORAL | 0 refills | Status: AC

## 2020-08-28 MED ORDER — HYDROCODONE-ACETAMINOPHEN 5-325 MG PO TABS: 1.0000 | ORAL_TABLET | Freq: Four times a day (QID) | ORAL | 0 refills | Status: AC | PRN

## 2020-08-28 NOTE — Addendum Note (Signed)
Addended byRoetta Sessions on: 08/28/2020 02:59 PM   Modules accepted: Orders

## 2020-09-03 ENCOUNTER — Ambulatory Visit: Payer: Managed Care, Other (non HMO)

## 2020-09-03 ENCOUNTER — Other Ambulatory Visit: Payer: Managed Care, Other (non HMO)

## 2020-09-03 ENCOUNTER — Ambulatory Visit: Payer: Managed Care, Other (non HMO) | Admitting: Oncology

## 2020-09-04 ENCOUNTER — Encounter
Admission: RE | Admit: 2020-09-04 | Discharge: 2020-09-04 | Disposition: A | Discharge status: Home / Self Care | Payer: Medicaid Other | Source: Ambulatory Visit | Attending: Surgery | Admitting: Surgery

## 2020-09-04 DIAGNOSIS — Z01818 Encounter for other preprocedural examination: Secondary | ICD-10-CM | POA: Diagnosis present

## 2020-09-04 HISTORY — DX: Anemia, unspecified: D64.9

## 2020-09-04 NOTE — Patient Instructions (Signed)
Your procedure is scheduled on: 09/14/20-  Monday Report to the Registration Desk on the 1st floor of the [facility]. To find out your arrival time, please call [phone] between 1PM - 3PM on: 09/11/20 - Friday  REMEMBER: Instructions that are not followed completely may result in serious medical risk, up to and including death; or upon the discretion of your surgeon and anesthesiologist your surgery may need to be rescheduled.  Do not eat food after midnight the night before surgery.  No gum chewing, lozengers or hard candies.  You may however, drink CLEAR liquids up to 2 hours before you are scheduled to arrive for your surgery. Do not drink anything within 2 hours of your scheduled arrival time.  Clear liquids include: - water  - apple juice without pulp - gatorade (not RED, PURPLE, OR BLUE) - black coffee or tea (Do NOT add milk or creamers to the coffee or tea) Do NOT drink anything that is not on this list.  Type 1 and Type 2 diabetics should only drink water.  TAKE THESE MEDICATIONS THE MORNING OF SURGERY WITH A SIP OF WATER:  - ALPRAZolam (XANAX) 0.5 MG tablet - oxyCODONE (OXY IR/ROXICODONE) 5 MG immediate release tablet  Use inhaler albuterol (VENTOLIN HFA) 108 (90 Base) MCG/ACT inhaler on the day of surgery and bring to the hospital.  One week prior to surgery: Stop Anti-inflammatories (NSAIDS) such as Advil, Aleve, Ibuprofen, Motrin, Naproxen, Naprosyn and Aspirin based products such as Excedrin, Goodys Powder, BC Powder.  Stop ANY OVER THE COUNTER supplements until after surgery. (However, you may continue taking Vitamin D, Vitamin B, and multivitamin up until the day before surgery.)  No Alcohol for 24 hours before or after surgery.  No Smoking including e-cigarettes for 24 hours prior to surgery.  No chewable tobacco products for at least 6 hours prior to surgery.  No nicotine patches on the day of surgery.  Do not use any "recreational" drugs for at least  a week prior to your surgery.  Please be advised that the combination of cocaine and anesthesia may have negative outcomes, up to and including death. If you test positive for cocaine, your surgery will be cancelled.  On the morning of surgery brush your teeth with toothpaste and water, you may rinse your mouth with mouthwash if you wish. Do not swallow any toothpaste or mouthwash.  Do not wear jewelry, make-up, hairpins, clips or nail polish.  Do not wear lotions, powders, or perfumes.   Do not shave body from the neck down 48 hours prior to surgery just in case you cut yourself which could leave a site for infection.  Also, freshly shaved skin may become irritated if using the CHG soap.  Contact lenses, hearing aids and dentures may not be worn into surgery.  Do not bring valuables to the hospital. [facility] is not responsible for any missing/lost belongings or valuables.   Use CHG Soap or wipes as directed on instruction sheet.  Notify your doctor if there is any change in your medical condition (cold, fever, infection).  Wear comfortable clothing (specific to your surgery type) to the hospital.  Plan for stool softeners for home use; pain medications have a tendency to cause constipation. You can also help prevent constipation by eating foods high in fiber such as fruits and vegetables and drinking plenty of fluids as your diet allows.  After surgery, you can help prevent lung complications by doing breathing exercises.  Take deep breaths and cough  every 1-2 hours. Your doctor may order a device called an Incentive Spirometer to help you take deep breaths. When coughing or sneezing, hold a pillow firmly against your incision with both hands. This is called "splinting." Doing this helps protect your incision. It also decreases belly discomfort.  If you are being admitted to the hospital overnight, leave your suitcase in the car. After surgery it may be brought to your room.  If  you are being discharged the day of surgery, you will not be allowed to drive home. You will need a responsible adult (18 years or older) to drive you home and stay with you that night.   If you are taking public transportation, you will need to have a responsible adult (18 years or older) with you. Please confirm with your physician that it is acceptable to use public transportation.   Please call the [facility] at [phone] if you have any questions about these instructions.  Visitation Policy:  Patients undergoing a surgery or procedure may have one family member or support person with them as long as that person is not COVID-19 positive or experiencing its symptoms.  That person may remain in the waiting area during the procedure.  Inpatient Visitation Update:   In an effort to ensure the safety of our team members and our patients, we are implementing a change to our visitation policy:  Effective Monday, Aug. 9, at 7 a.m., inpatients will be allowed one support person.  o The support person may change daily.  o The support person must pass our screening, gel in and out, and wear a mask at all times, including in the patient's room.  o Patients must also wear a mask when staff or their support person are in the room.  o Masking is required regardless of vaccination status.  Systemwide, no visitors 17 or younger.

## 2020-09-10 ENCOUNTER — Other Ambulatory Visit: Admission: RE | Admit: 2020-09-10 | Payer: Medicaid Other | Source: Ambulatory Visit

## 2020-09-11 ENCOUNTER — Other Ambulatory Visit
Admission: RE | Admit: 2020-09-11 | Discharge: 2020-09-11 | Disposition: A | Discharge status: Home / Self Care | Payer: Medicaid Other | Source: Ambulatory Visit | Attending: Surgery | Admitting: Surgery

## 2020-09-11 ENCOUNTER — Other Ambulatory Visit: Payer: Self-pay

## 2020-09-11 DIAGNOSIS — Z01812 Encounter for preprocedural laboratory examination: Secondary | ICD-10-CM | POA: Diagnosis present

## 2020-09-11 DIAGNOSIS — Z20822 Contact with and (suspected) exposure to covid-19: Secondary | ICD-10-CM | POA: Diagnosis not present

## 2020-09-12 LAB — SARS CORONAVIRUS 2 (TAT 6-24 HRS): SARS Coronavirus 2: NEGATIVE

## 2020-09-14 ENCOUNTER — Other Ambulatory Visit: Payer: Self-pay

## 2020-09-14 ENCOUNTER — Encounter
Admission: RE | Disposition: A | Discharge status: Home / Self Care | Payer: Self-pay | Source: Home / Self Care | Attending: Surgery

## 2020-09-14 ENCOUNTER — Observation Stay
Admission: RE | Admit: 2020-09-14 | Discharge: 2020-09-15 | Disposition: A | Discharge status: Home / Self Care | Payer: Medicaid Other | Attending: Surgery | Admitting: Surgery

## 2020-09-14 ENCOUNTER — Ambulatory Visit
Admission: RE | Admit: 2020-09-14 | Discharge: 2020-09-14 | Disposition: A | Discharge status: Home / Self Care | Payer: Medicaid Other | Source: Ambulatory Visit | Attending: General Surgery | Admitting: General Surgery

## 2020-09-14 ENCOUNTER — Ambulatory Visit: Payer: Medicaid Other | Admitting: Anesthesiology

## 2020-09-14 ENCOUNTER — Encounter: Payer: Self-pay | Admitting: Surgery

## 2020-09-14 DIAGNOSIS — Z170 Estrogen receptor positive status [ER+]: Secondary | ICD-10-CM | POA: Insufficient documentation

## 2020-09-14 DIAGNOSIS — C50411 Malignant neoplasm of upper-outer quadrant of right female breast: Secondary | ICD-10-CM

## 2020-09-14 DIAGNOSIS — Z91040 Latex allergy status: Secondary | ICD-10-CM | POA: Insufficient documentation

## 2020-09-14 DIAGNOSIS — C50511 Malignant neoplasm of lower-outer quadrant of right female breast: Secondary | ICD-10-CM | POA: Diagnosis present

## 2020-09-14 DIAGNOSIS — C50919 Malignant neoplasm of unspecified site of unspecified female breast: Secondary | ICD-10-CM

## 2020-09-14 DIAGNOSIS — J45909 Unspecified asthma, uncomplicated: Secondary | ICD-10-CM | POA: Diagnosis not present

## 2020-09-14 DIAGNOSIS — Z421 Encounter for breast reconstruction following mastectomy: Secondary | ICD-10-CM

## 2020-09-14 DIAGNOSIS — C50911 Malignant neoplasm of unspecified site of right female breast: Secondary | ICD-10-CM | POA: Diagnosis not present

## 2020-09-14 HISTORY — PX: BREAST RECONSTRUCTION WITH PLACEMENT OF TISSUE EXPANDER AND FLEX HD (ACELLULAR HYDRATED DERMIS): SHX6295

## 2020-09-14 HISTORY — PX: TOTAL MASTECTOMY: SHX6129

## 2020-09-14 HISTORY — PX: PORT-A-CATH REMOVAL: SHX5289

## 2020-09-14 LAB — CBC
HCT: 32.2 % — ABNORMAL LOW (ref 36.0–46.0)
Hemoglobin: 10.7 g/dL — ABNORMAL LOW (ref 12.0–15.0)
MCH: 30.3 pg (ref 26.0–34.0)
MCHC: 33.2 g/dL (ref 30.0–36.0)
MCV: 91.2 fL (ref 80.0–100.0)
Platelets: 280 10*3/uL (ref 150–400)
RBC: 3.53 MIL/uL — ABNORMAL LOW (ref 3.87–5.11)
RDW: 14.9 % (ref 11.5–15.5)
WBC: 11.3 10*3/uL — ABNORMAL HIGH (ref 4.0–10.5)
nRBC: 0 % (ref 0.0–0.2)

## 2020-09-14 LAB — CREATININE, SERUM
Creatinine, Ser: 0.75 mg/dL (ref 0.44–1.00)
GFR, Estimated: >60 mL/min (ref >60)

## 2020-09-14 SURGERY — MASTECTOMY, SIMPLE
Anesthesia: General | Site: Breast | Laterality: Bilateral

## 2020-09-14 MED ORDER — PROPOFOL 10 MG/ML IV BOLUS
INTRAVENOUS | Status: AC
  Filled 2020-09-14: qty 40

## 2020-09-14 MED ORDER — CLINDAMYCIN PHOSPHATE 900 MG/50ML IV SOLN
900.0000 mg | INTRAVENOUS | Status: AC
  Administered 2020-09-14: 900 mg via INTRAVENOUS

## 2020-09-14 MED ORDER — FAMOTIDINE 20 MG PO TABS
ORAL_TABLET | ORAL | Status: AC
  Filled 2020-09-14: qty 1

## 2020-09-14 MED ORDER — CELECOXIB 200 MG PO CAPS
ORAL_CAPSULE | ORAL | Status: AC
  Administered 2020-09-14: 200 mg via ORAL
  Filled 2020-09-14: qty 1

## 2020-09-14 MED ORDER — FENTANYL CITRATE (PF) 100 MCG/2ML IJ SOLN
25.0000 ug | INTRAMUSCULAR | Status: DC | PRN
  Administered 2020-09-14 (×3): 25 ug via INTRAVENOUS

## 2020-09-14 MED ORDER — CELECOXIB 200 MG PO CAPS
ORAL_CAPSULE | ORAL | Status: AC
  Filled 2020-09-14: qty 1

## 2020-09-14 MED ORDER — BISACODYL 5 MG PO TBEC
5.0000 mg | DELAYED_RELEASE_TABLET | Freq: Two times a day (BID) | ORAL | Status: DC | PRN
  Filled 2020-09-14: qty 1

## 2020-09-14 MED ORDER — ACETAMINOPHEN 500 MG PO TABS
ORAL_TABLET | ORAL | Status: AC
  Administered 2020-09-14: 1000 mg via ORAL
  Filled 2020-09-14: qty 2

## 2020-09-14 MED ORDER — ALPRAZOLAM 0.5 MG PO TABS: 0.5000 mg | ORAL_TABLET | Freq: Three times a day (TID) | ORAL | Status: DC | PRN

## 2020-09-14 MED ORDER — PROCHLORPERAZINE MALEATE 10 MG PO TABS
10.0000 mg | ORAL_TABLET | Freq: Four times a day (QID) | ORAL | Status: DC | PRN
  Filled 2020-09-14: qty 1

## 2020-09-14 MED ORDER — ORAL CARE MOUTH RINSE: 15.0000 mL | Freq: Once | OROMUCOSAL | Status: AC

## 2020-09-14 MED ORDER — HYDROMORPHONE HCL 1 MG/ML IJ SOLN
0.2500 mg | INTRAMUSCULAR | Status: DC | PRN
  Administered 2020-09-14: 0.25 mg via INTRAVENOUS

## 2020-09-14 MED ORDER — BUPIVACAINE-EPINEPHRINE (PF) 0.5% -1:200000 IJ SOLN
INTRAMUSCULAR | Status: AC
  Filled 2020-09-14: qty 30

## 2020-09-14 MED ORDER — BUPIVACAINE LIPOSOME 1.3 % IJ SUSP
INTRAMUSCULAR | Status: AC
  Filled 2020-09-14: qty 20

## 2020-09-14 MED ORDER — ONDANSETRON HCL 4 MG/2ML IJ SOLN: 4.0000 mg | Freq: Once | INTRAMUSCULAR | Status: DC | PRN

## 2020-09-14 MED ORDER — SERTRALINE HCL 100 MG PO TABS
100.0000 mg | ORAL_TABLET | Freq: Every day | ORAL | Status: DC
  Administered 2020-09-14: 100 mg via ORAL
  Filled 2020-09-14: qty 1

## 2020-09-14 MED ORDER — ONDANSETRON HCL 4 MG/2ML IJ SOLN
INTRAMUSCULAR | Status: DC | PRN
  Administered 2020-09-14: 4 mg via INTRAVENOUS

## 2020-09-14 MED ORDER — FENTANYL CITRATE (PF) 250 MCG/5ML IJ SOLN
INTRAMUSCULAR | Status: AC
  Filled 2020-09-14: qty 5

## 2020-09-14 MED ORDER — GENTAMICIN SULFATE 40 MG/ML IJ SOLN
INTRAMUSCULAR | Status: AC
  Filled 2020-09-14: qty 8

## 2020-09-14 MED ORDER — ACETAMINOPHEN 500 MG PO TABS: 1000.0000 mg | ORAL_TABLET | ORAL | Status: AC

## 2020-09-14 MED ORDER — ONDANSETRON HCL 4 MG/2ML IJ SOLN: 4.0000 mg | Freq: Four times a day (QID) | INTRAMUSCULAR | Status: DC | PRN

## 2020-09-14 MED ORDER — OLANZAPINE 10 MG PO TABS
10.0000 mg | ORAL_TABLET | Freq: Every day | ORAL | Status: DC
  Administered 2020-09-14: 10 mg via ORAL
  Filled 2020-09-14: qty 1

## 2020-09-14 MED ORDER — OXYCODONE HCL 5 MG PO TABS
ORAL_TABLET | ORAL | Status: AC
  Filled 2020-09-14: qty 2

## 2020-09-14 MED ORDER — MIDAZOLAM HCL 2 MG/2ML IJ SOLN
INTRAMUSCULAR | Status: DC | PRN
  Administered 2020-09-14: 2 mg via INTRAVENOUS

## 2020-09-14 MED ORDER — ALBUTEROL SULFATE HFA 108 (90 BASE) MCG/ACT IN AERS
2.0000 | INHALATION_SPRAY | Freq: Four times a day (QID) | RESPIRATORY_TRACT | Status: DC | PRN
  Filled 2020-09-14: qty 6.7

## 2020-09-14 MED ORDER — SCOPOLAMINE 1 MG/3DAYS TD PT72
MEDICATED_PATCH | TRANSDERMAL | Status: AC
  Administered 2020-09-14: 1.5 mg via TRANSDERMAL
  Filled 2020-09-14: qty 1

## 2020-09-14 MED ORDER — PHENYLEPHRINE HCL-NACL 20-0.9 MG/250ML-% IV SOLN
INTRAVENOUS | Status: DC | PRN
  Administered 2020-09-14: 10 ug/min via INTRAVENOUS

## 2020-09-14 MED ORDER — CHLORHEXIDINE GLUCONATE CLOTH 2 % EX PADS: 6.0000 | MEDICATED_PAD | Freq: Once | CUTANEOUS | Status: DC

## 2020-09-14 MED ORDER — SCOPOLAMINE 1 MG/3DAYS TD PT72: 1.0000 | MEDICATED_PATCH | TRANSDERMAL | Status: DC

## 2020-09-14 MED ORDER — OXYCODONE HCL 5 MG PO TABS
ORAL_TABLET | ORAL | Status: AC
  Administered 2020-09-14: 10 mg via ORAL
  Filled 2020-09-14: qty 2

## 2020-09-14 MED ORDER — LIDOCAINE HCL (CARDIAC) PF 100 MG/5ML IV SOSY
PREFILLED_SYRINGE | INTRAVENOUS | Status: DC | PRN
  Administered 2020-09-14: 100 mg via INTRAVENOUS
  Administered 2020-09-14: 50 mg via INTRAVENOUS

## 2020-09-14 MED ORDER — PHENYLEPHRINE HCL (PRESSORS) 10 MG/ML IV SOLN
INTRAVENOUS | Status: DC | PRN
  Administered 2020-09-14 (×2): 100 ug via INTRAVENOUS
  Administered 2020-09-14 (×2): 50 ug via INTRAVENOUS

## 2020-09-14 MED ORDER — HYDROMORPHONE HCL 1 MG/ML IJ SOLN
0.2500 mg | INTRAMUSCULAR | Status: DC | PRN
  Administered 2020-09-14 (×2): 0.25 mg via INTRAVENOUS

## 2020-09-14 MED ORDER — CLINDAMYCIN PHOSPHATE 900 MG/50ML IV SOLN: 900.0000 mg | INTRAVENOUS | Status: DC

## 2020-09-14 MED ORDER — CELECOXIB 200 MG PO CAPS
200.0000 mg | ORAL_CAPSULE | Freq: Two times a day (BID) | ORAL | Status: DC
  Administered 2020-09-14: 200 mg via ORAL

## 2020-09-14 MED ORDER — BUPIVACAINE-EPINEPHRINE (PF) 0.25% -1:200000 IJ SOLN
INTRAMUSCULAR | Status: AC
  Filled 2020-09-14: qty 30

## 2020-09-14 MED ORDER — ADULT MULTIVITAMIN W/MINERALS CH
1.0000 | ORAL_TABLET | Freq: Every day | ORAL | Status: DC
  Administered 2020-09-14 – 2020-09-15 (×2): 1 via ORAL
  Filled 2020-09-14 (×2): qty 1

## 2020-09-14 MED ORDER — LIDOCAINE HCL (PF) 1 % IJ SOLN
INTRAMUSCULAR | Status: AC
  Filled 2020-09-14: qty 30

## 2020-09-14 MED ORDER — BUPIVACAINE HCL (PF) 0.5 % IJ SOLN
INTRAMUSCULAR | Status: AC
  Filled 2020-09-14: qty 30

## 2020-09-14 MED ORDER — DIAZEPAM 2 MG PO TABS
ORAL_TABLET | ORAL | Status: AC
  Filled 2020-09-14: qty 1

## 2020-09-14 MED ORDER — PROPOFOL 10 MG/ML IV BOLUS
INTRAVENOUS | Status: AC
  Filled 2020-09-14: qty 20

## 2020-09-14 MED ORDER — DOCUSATE SODIUM 100 MG PO CAPS
100.0000 mg | ORAL_CAPSULE | Freq: Every day | ORAL | Status: DC | PRN
  Filled 2020-09-14: qty 1

## 2020-09-14 MED ORDER — BUPIVACAINE HCL 0.5 % IJ SOLN
INTRAMUSCULAR | Status: DC | PRN
  Administered 2020-09-14: 15 mL

## 2020-09-14 MED ORDER — CHLORHEXIDINE GLUCONATE 0.12 % MT SOLN: 15.0000 mL | Freq: Once | OROMUCOSAL | Status: AC

## 2020-09-14 MED ORDER — ACETAMINOPHEN 325 MG PO TABS: 650.0000 mg | ORAL_TABLET | Freq: Four times a day (QID) | ORAL | Status: DC | PRN

## 2020-09-14 MED ORDER — ROCURONIUM BROMIDE 100 MG/10ML IV SOLN
INTRAVENOUS | Status: DC | PRN
  Administered 2020-09-14: 10 mg via INTRAVENOUS
  Administered 2020-09-14: 50 mg via INTRAVENOUS
  Administered 2020-09-14: 20 mg via INTRAVENOUS

## 2020-09-14 MED ORDER — FENTANYL CITRATE (PF) 100 MCG/2ML IJ SOLN
INTRAMUSCULAR | Status: AC
  Administered 2020-09-14: 25 ug via INTRAVENOUS
  Filled 2020-09-14: qty 2

## 2020-09-14 MED ORDER — HYDROMORPHONE HCL 1 MG/ML IJ SOLN
INTRAMUSCULAR | Status: AC
  Administered 2020-09-14: 0.25 mg via INTRAVENOUS
  Filled 2020-09-14: qty 1

## 2020-09-14 MED ORDER — ENOXAPARIN SODIUM 40 MG/0.4ML ~~LOC~~ SOLN: 40.0000 mg | SUBCUTANEOUS | Status: DC

## 2020-09-14 MED ORDER — CHLORHEXIDINE GLUCONATE 0.12 % MT SOLN
OROMUCOSAL | Status: AC
  Administered 2020-09-14: 15 mL via OROMUCOSAL
  Filled 2020-09-14: qty 15

## 2020-09-14 MED ORDER — DIAZEPAM 2 MG PO TABS
ORAL_TABLET | ORAL | Status: AC
  Administered 2020-09-14: 2 mg via ORAL
  Filled 2020-09-14: qty 1

## 2020-09-14 MED ORDER — TRAMADOL HCL 50 MG PO TABS: 50.0000 mg | ORAL_TABLET | Freq: Four times a day (QID) | ORAL | Status: DC | PRN

## 2020-09-14 MED ORDER — MIDAZOLAM HCL 2 MG/2ML IJ SOLN
INTRAMUSCULAR | Status: AC
  Filled 2020-09-14: qty 2

## 2020-09-14 MED ORDER — SUGAMMADEX SODIUM 200 MG/2ML IV SOLN
INTRAVENOUS | Status: DC | PRN
  Administered 2020-09-14: 200 mg via INTRAVENOUS

## 2020-09-14 MED ORDER — TRAZODONE HCL 50 MG PO TABS
50.0000 mg | ORAL_TABLET | Freq: Every evening | ORAL | Status: DC | PRN
  Filled 2020-09-14: qty 2

## 2020-09-14 MED ORDER — MORPHINE SULFATE (PF) 4 MG/ML IV SOLN: 1.0000 mg | INTRAVENOUS | Status: DC | PRN

## 2020-09-14 MED ORDER — SODIUM CHLORIDE 0.9 % IV SOLN
INTRAVENOUS | Status: DC | PRN
  Administered 2020-09-14: 1000 mL

## 2020-09-14 MED ORDER — ONDANSETRON 4 MG PO TBDP: 4.0000 mg | ORAL_TABLET | Freq: Four times a day (QID) | ORAL | Status: DC | PRN

## 2020-09-14 MED ORDER — GABAPENTIN 300 MG PO CAPS
300.0000 mg | ORAL_CAPSULE | ORAL | Status: AC
  Administered 2020-09-14: 300 mg via ORAL

## 2020-09-14 MED ORDER — OXYCODONE HCL 5 MG PO TABS
5.0000 mg | ORAL_TABLET | ORAL | Status: DC | PRN
  Administered 2020-09-14 – 2020-09-15 (×2): 10 mg via ORAL

## 2020-09-14 MED ORDER — LACTATED RINGERS IV SOLN: INTRAVENOUS | Status: DC

## 2020-09-14 MED ORDER — CLINDAMYCIN PHOSPHATE 900 MG/50ML IV SOLN
INTRAVENOUS | Status: AC
  Filled 2020-09-14: qty 50

## 2020-09-14 MED ORDER — FENTANYL CITRATE (PF) 100 MCG/2ML IJ SOLN
INTRAMUSCULAR | Status: DC | PRN
  Administered 2020-09-14 (×2): 50 ug via INTRAVENOUS
  Administered 2020-09-14: 100 ug via INTRAVENOUS

## 2020-09-14 MED ORDER — DIAZEPAM 2 MG PO TABS
2.0000 mg | ORAL_TABLET | Freq: Two times a day (BID) | ORAL | Status: DC
  Administered 2020-09-14: 2 mg via ORAL

## 2020-09-14 MED ORDER — PROPOFOL 10 MG/ML IV BOLUS
INTRAVENOUS | Status: DC | PRN
  Administered 2020-09-14: 150 mg via INTRAVENOUS

## 2020-09-14 MED ORDER — FAMOTIDINE 20 MG PO TABS
20.0000 mg | ORAL_TABLET | Freq: Once | ORAL | Status: AC
  Administered 2020-09-14: 20 mg via ORAL

## 2020-09-14 MED ORDER — GABAPENTIN 300 MG PO CAPS
ORAL_CAPSULE | ORAL | Status: AC
  Filled 2020-09-14: qty 1

## 2020-09-14 MED ORDER — DEXAMETHASONE SODIUM PHOSPHATE 10 MG/ML IJ SOLN
INTRAMUSCULAR | Status: DC | PRN
  Administered 2020-09-14: 10 mg via INTRAVENOUS

## 2020-09-14 SURGICAL SUPPLY — 98 items
APPLIER CLIP 11 MED OPEN (CLIP)
BAG DECANTER FOR FLEXI CONT (MISCELLANEOUS) ×4 IMPLANT
BINDER BREAST LRG (GAUZE/BANDAGES/DRESSINGS) IMPLANT
BINDER BREAST MEDIUM (GAUZE/BANDAGES/DRESSINGS) IMPLANT
BINDER BREAST XLRG (GAUZE/BANDAGES/DRESSINGS) ×1 IMPLANT
BINDER BREAST XXLRG (GAUZE/BANDAGES/DRESSINGS) IMPLANT
BIOPATCH WHT 1IN DISK W/4.0 H (GAUZE/BANDAGES/DRESSINGS) ×8 IMPLANT
BLADE BOVIE TIP EXT 4 (BLADE) ×4 IMPLANT
BLADE SURG 15 STRL LF DISP TIS (BLADE) ×6 IMPLANT
BLADE SURG 15 STRL SS (BLADE) ×8
BNDG GAUZE 4.5X4.1 6PLY STRL (MISCELLANEOUS) ×8 IMPLANT
BULB RESERV EVAC DRAIN JP 100C (MISCELLANEOUS) ×8 IMPLANT
CANISTER SUCT 1200ML W/VALVE (MISCELLANEOUS) ×8 IMPLANT
CHLORAPREP W/TINT 26 (MISCELLANEOUS) ×4 IMPLANT
CLIP APPLIE 11 MED OPEN (CLIP) IMPLANT
CNTNR SPEC 2.5X3XGRAD LEK (MISCELLANEOUS) ×3
CONT SPEC 4OZ STER OR WHT (MISCELLANEOUS) ×1
CONT SPEC 4OZ STRL OR WHT (MISCELLANEOUS) ×3
CONTAINER SPEC 2.5X3XGRAD LEK (MISCELLANEOUS) ×3 IMPLANT
COVER WAND RF STERILE (DRAPES) ×4 IMPLANT
DECANTER SPIKE VIAL GLASS SM (MISCELLANEOUS) IMPLANT
DERMABOND ADVANCED (GAUZE/BANDAGES/DRESSINGS) ×3
DERMABOND ADVANCED .7 DNX12 (GAUZE/BANDAGES/DRESSINGS) ×9 IMPLANT
DEVICE DSSCT PLSMBLD 3.0S LGHT (MISCELLANEOUS) ×3 IMPLANT
DEVICE DUBIN SPECIMEN MAMMOGRA (MISCELLANEOUS) ×4 IMPLANT
DRAIN CHANNEL JP 19F (MISCELLANEOUS) ×8 IMPLANT
DRAPE LAPAROTOMY 77X122 PED (DRAPES) ×4 IMPLANT
DRAPE LAPAROTOMY TRNSV 106X77 (MISCELLANEOUS) IMPLANT
DRSG OPSITE POSTOP 4X8 (GAUZE/BANDAGES/DRESSINGS) ×2 IMPLANT
DRSG TEGADERM 2-3/8X2-3/4 SM (GAUZE/BANDAGES/DRESSINGS) ×1 IMPLANT
ELECT CAUTERY BLADE 6.4 (BLADE) ×4 IMPLANT
ELECT CAUTERY BLADE TIP 2.5 (TIP) ×8
ELECT REM PT RETURN 9FT ADLT (ELECTROSURGICAL) ×4
ELECTRODE CAUTERY BLDE TIP 2.5 (TIP) ×6 IMPLANT
ELECTRODE REM PT RTRN 9FT ADLT (ELECTROSURGICAL) ×3 IMPLANT
GLOVE BIO SURGEON STRL SZ 6.5 (GLOVE) ×16 IMPLANT
GLOVE BIOGEL PI IND STRL 7.0 (GLOVE) ×3 IMPLANT
GLOVE BIOGEL PI INDICATOR 7.0 (GLOVE) ×1
GLOVE SURG SYN 6.5 ES PF (GLOVE) ×20 IMPLANT
GLOVE SURG SYN 6.5 PF PI (GLOVE) ×6 IMPLANT
GLOVE SURG SYN 7.5  E (GLOVE) ×4
GLOVE SURG SYN 7.5 E (GLOVE) ×3 IMPLANT
GLOVE SURG SYN 7.5 PF PI (GLOVE) IMPLANT
GOWN STRL REUS W/ TWL LRG LVL3 (GOWN DISPOSABLE) ×21 IMPLANT
GOWN STRL REUS W/TWL LRG LVL3 (GOWN DISPOSABLE) ×28
GRAFT FLEX HD 6X16 PLIABLE (Tissue) ×2 IMPLANT
IMPL EXPANDER BREAST 535CC (Breast) IMPLANT
IMPLANT BREAST 535CC (Breast) ×2 IMPLANT
IMPLANT EXPANDER BREAST 535CC (Breast) ×6 IMPLANT
IV NS 1000ML (IV SOLUTION)
IV NS 1000ML BAXH (IV SOLUTION) IMPLANT
IV NS 500ML (IV SOLUTION)
IV NS 500ML BAXH (IV SOLUTION) IMPLANT
KIT MARKER MARGIN INK (KITS) IMPLANT
KIT TURNOVER KIT A (KITS) ×4 IMPLANT
LABEL OR SOLS (LABEL) ×4 IMPLANT
LIGHT WAVEGUIDE WIDE FLAT (MISCELLANEOUS) ×1 IMPLANT
MANIFOLD NEPTUNE II (INSTRUMENTS) ×8 IMPLANT
MARKER MARGIN CORRECT CLIP (MARKER) IMPLANT
NDL FILTER BLUNT 18X1 1/2 (NEEDLE) ×6 IMPLANT
NEEDLE FILTER BLUNT 18X 1/2SAF (NEEDLE) ×2
NEEDLE FILTER BLUNT 18X1 1/2 (NEEDLE) ×6 IMPLANT
NEEDLE HYPO 22GX1.5 SAFETY (NEEDLE) ×8 IMPLANT
PACK BASIN MAJOR ARMC (MISCELLANEOUS) ×4 IMPLANT
PACK BASIN MINOR (MISCELLANEOUS) ×4 IMPLANT
PACK UNIVERSAL (MISCELLANEOUS) IMPLANT
PAD ABD DERMACEA PRESS 5X9 (GAUZE/BANDAGES/DRESSINGS) ×16 IMPLANT
PIN SAFETY STRL (MISCELLANEOUS) ×4 IMPLANT
PLASMABLADE 3.0S W/LIGHT (MISCELLANEOUS) ×4
SET ASEPTIC TRANSFER (MISCELLANEOUS) ×12 IMPLANT
SOL PREP PVP 2OZ (MISCELLANEOUS) ×8
SOLUTION PREP PVP 2OZ (MISCELLANEOUS) ×6 IMPLANT
SPONGE LAP 18X18 RF (DISPOSABLE) ×14 IMPLANT
STRIP CLOSURE SKIN 1/4X4 (GAUZE/BANDAGES/DRESSINGS) ×2 IMPLANT
SUT MNCRL 3-0 UNDYED SH (SUTURE) ×6 IMPLANT
SUT MNCRL 4-0 (SUTURE) ×20
SUT MNCRL 4-0 27XMFL (SUTURE) ×15
SUT MNCRL+ 5-0 UNDYED PC-3 (SUTURE) ×6 IMPLANT
SUT MONOCRYL 3-0 UNDYED (SUTURE) ×8
SUT MONOCRYL 5-0 (SUTURE) ×8
SUT PDS 2-0 27IN (SUTURE) ×4 IMPLANT
SUT PDS AB 2-0 CT1 27 (SUTURE) ×1 IMPLANT
SUT PDS PLUS 2 (SUTURE) ×24
SUT PDS PLUS AB 2-0 CT-1 (SUTURE) ×18 IMPLANT
SUT SILK 3 0 (SUTURE) ×8
SUT SILK 3 0 12 30 (SUTURE) IMPLANT
SUT SILK 3 0 SH 30 (SUTURE) ×3 IMPLANT
SUT SILK 3-0 18XBRD TIE 12 (SUTURE) IMPLANT
SUT SILK 4 0 SH (SUTURE) ×8 IMPLANT
SUT VIC AB 3-0 SH 27 (SUTURE) ×12
SUT VIC AB 3-0 SH 27X BRD (SUTURE) ×6 IMPLANT
SUTURE MNCRL 4-0 27XMF (SUTURE) ×12 IMPLANT
SYR 10ML LL (SYRINGE) ×8 IMPLANT
SYR 20ML LL LF (SYRINGE) ×4 IMPLANT
SYR BULB IRRIG 60ML STRL (SYRINGE) ×8 IMPLANT
TOWEL OR 17X26 4PK STRL BLUE (TOWEL DISPOSABLE) ×4 IMPLANT
TUBING CONNECTING 10 (TUBING) ×4 IMPLANT
WATER STERILE IRR 1000ML POUR (IV SOLUTION) ×4 IMPLANT
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
## 2020-09-14 NOTE — Interval H&P Note (Signed)
History and Physical Interval Note:  09/14/2020 7:29 AM  Adela Lank [name]  has presented today for surgery, with the diagnosis of Malignant neoplasm of lower-outer quadrant of right breast of female, estrogen receptor positive ([facility]) C50.511, Z17.0.  The various methods of treatment have been discussed with the patient and family. After consideration of risks, benefits and other options for treatment, the patient has consented to  Procedure(s): TOTAL MASTECTOMY (Bilateral) IMMEDIATE BILATERAL BREAST RECONSTRUCTION WITH PLACEMENT OF TISSUE EXPANDER AND FLEX HD (ACELLULAR HYDRATED DERMIS) ORDERED 10/22 (Bilateral) as a surgical intervention.  The patient's history has been reviewed, patient examined, no change in status, stable for surgery.  I have reviewed the patient's chart and labs.  Questions were answered to the patient's satisfaction.     Bekah Igoe [name]

## 2020-09-14 NOTE — Op Note (Signed)
Op report    DATE OF OPERATION:  09/14/2020  LOCATION: [facility] Main OR  SURGICAL DIVISION: Plastic Surgery  PREOPERATIVE DIAGNOSES:  1. Right Breast cancer.    POSTOPERATIVE DIAGNOSES:  1. Right Breast cancer.   PROCEDURE:  1. Bilateral immediate breast reconstruction with placement of Acellular Dermal Matrix and tissue expanders.  SURGEON: Zephyr Sausedo Sanger Elnita Surprenant, DO  ASSISTANT: Rodman Key Scheeler  ANESTHESIA:  General.   COMPLICATIONS: None.   IMPLANTS: Left - Mentor 535 cc. Ref #[id number].  200 cc of injectable saline placed in the expander. Right - Mentor 535 cc. Ref #[id number]. 200 cc of injectable saline placed in the expander. Acellular Dermal Matrix 6 x 16 cm two Flex HD  INDICATIONS FOR PROCEDURE:  The patient, [name], is a 39 y.o. female born on 12-07-1980, is here for  immediate first stage breast reconstruction with placement of bilateral tissue expander and Acellular dermal matrix. MRN: [id number]  CONSENT:  Informed consent was obtained directly from the patient. Risks, benefits and alternatives were fully discussed. Specific risks including but not limited to bleeding, infection, hematoma, seroma, scarring, pain, implant infection, implant extrusion, capsular contracture, asymmetry, wound healing problems, and need for further surgery were all discussed. The patient did have an ample opportunity to have her questions answered to her satisfaction.   DESCRIPTION OF PROCEDURE:  The patient was taken to the operating room by the general surgery team. SCDs were placed and IV antibiotics were given. The patient's chest was prepped and draped in a sterile fashion. A time out was performed and the implants to be used were identified.  Bilateral mastectomies were performed.  Once the general surgery team had completed their portion of the case the patient was rendered to the plastic and reconstructive surgery team.  Left:  The pectoralis major  muscle was lifted from the chest wall with release of the lateral edge and lateral inframammary fold.  The pocket was irrigated with antibiotic solution and hemostasis was achieved with electrocautery.  The ADM was then prepared according to the manufacture guidelines and slits placed to help with postoperative fluid management.  The ADM was then sutured to the inferior and lateral edge of the inframammary fold with 2-0 PDS starting with an interrupted stitch and then a running stitch.  The lateral portion was sutured to with interrupted sutures after the expander was placed.  The expander was prepared according to the manufacture guidelines, the air evacuated and then it was placed under the ADM and pectoralis major muscle.  The inferior and lateral tabs were used to secure the expander to the chest wall with 2-0 PDS.  The inframammary fold was thick and therefore thinned with the tissue sent with the specimen. The drain was placed at the inframammary fold over the ADM and secured to the skin with 3-0 Silk.  The deep layers were closed with 3-0 Monocryl followed by 4-0 Monocryl.  The skin was closed with 5-0 Monocryl and then dermabond was applied.    Right: The pectoralis major muscle was lifted from the chest wall with release of the lateral edge and lateral inframammary fold.  The pocket was irrigated with antibiotic solution and hemostasis was achieved with electrocautery.  The ADM was then prepared according to the manufacture guidelines and slits placed to help with postoperative fluid management.  The ADM was then sutured to the inferior and lateral edge of the inframammary fold with 2-0 PDS starting with an interrupted stitch and then a running stitch.  The  lateral portion was sutured to with interrupted sutures after the expander was placed.  The expander was prepared according to the manufacture guidelines, the air evacuated and then it was placed under the ADM and pectoralis major muscle.  The inferior  and lateral tabs were used to secure the expander to the chest wall with 2-0 PDS.  The drain was placed at the inframammary fold over the ADM and secured to the skin with 3-0 Silk.  The deep layers were closed with 3-0 Monocryl followed by 4-0 Monocryl.  The skin was closed with 5-0 Monocryl and then dermabond was applied.   The advanced practice practitioner (APP) assisted throughout the case.  The APP was essential in retraction and counter traction when needed to make the case progress smoothly.  This retraction and assistance made it possible to see the tissue plans for the procedure.  The assistance was needed for blood control, tissue re-approximation and assisted with closure of the incision site.  The ABDs and breast binder were placed.  The patient tolerated the procedure well and there were no complications.  The patient was allowed to wake from anesthesia and taken to the recovery room in satisfactory condition.

## 2020-09-14 NOTE — Interval H&P Note (Signed)
History and Physical Interval Note:  09/14/2020 6:55 AM  [name]  has presented today for surgery, with the diagnosis of Malignant neoplasm of lower-outer quadrant of right breast of female, estrogen receptor positive (CMS-HCC) C50.511, Z17.0.  The various methods of treatment have been discussed with the patient and family. After consideration of risks, benefits and other options for treatment, the patient has consented to  Procedure(s): TOTAL MASTECTOMY (Bilateral) IMMEDIATE BILATERAL BREAST RECONSTRUCTION WITH PLACEMENT OF TISSUE EXPANDER AND FLEX HD (ACELLULAR HYDRATED DERMIS) ORDERED 10/22 (Bilateral) as a surgical intervention.  The patient's history has been reviewed, patient examined, no change in status, stable for surgery.  I have reviewed the patient's chart and labs.  Questions were answered to the patient's satisfaction.     Loel Lofty Mithcell Schumpert

## 2020-09-14 NOTE — Op Note (Signed)
Preoperative diagnosis: Right breast Cancer.  Postoperative diagnosis: same.   Procedure: Bilateral simple mastectomy, left subclavian port removal  Anesthesia: GETA  Surgeon: Dr. [name]  Wound Classification: Clean  Specimen: Right and left breast  Complications: None  Estimated Blood Loss: 69mL   Indications: See H&P   findings: 1. Right and left breast fully excised no evidence of gross pathology with an operative field afterwards  Description of procedure: The patient was brought to the operating room and general anesthesia was induced. A time-out was completed verifying correct patient, procedure, site, positioning, and implant(s) and/or special equipment prior to beginning this procedure. The breast, chest wall, axilla, and upper arm and neck were prepped and draped in the usual sterile fashion.   A skin incision was made that encompassed the nipple-areola  complex passed in an oblique direction across the left breast.   Skin flaps were raised in the avascular plane between subcutaneous tissue and breast tissue from the clavicle superiorly, the sternum medially, the anterior rectus sheath inferiorly, and past the lateral border of the pectoralis major muscle laterally. Hemostasis was achieved in the flaps. Next, the breast tissue and underlying pectoralis fascia were excised from the pectoralis major muscle, progressing from medially to laterally. At the lateral border of the pectoralis major muscle, the breast tissue was swung laterally and a lateral pedicle identified where breast tissue gave way to fat of axilla. The lateral pedicle was incised and the specimen removed.   Specimen marked long lateral, short superior and sent to pathology. The wound was irrigated and hemostasis was achieved.  Dr. Marla Roe then took over the operative field to proceed with her portion of the procedure to place expanders.  A skin incision was made that encompassed the nipple-areola complex  passed in an oblique direction across the Right breast.   Skin flaps were raised in the avascular plane between subcutaneous tissue and breast tissue from the clavicle superiorly, the sternum medially, the anterior rectus sheath inferiorly, and past the lateral border of the pectoralis major muscle laterally. Hemostasis was achieved in the flaps. Next, the breast tissue and underlying pectoralis fascia were excised from the pectoralis major muscle, progressing from medially to laterally. At the lateral border of the pectoralis major muscle, the breast tissue was swung laterally and a lateral pedicle identified where breast tissue gave way to fat of axilla. The lateral pedicle was incised and the specimen removed.   Specimen marked long lateral, short superior and sent to pathology. The wound was irrigated and hemostasis was achieved.  Dr. Marla Roe then took over the operative field to proceed with her portion of the procedure to place expanders.  Attention then turned to the left subclavian port site local infused to the previous incision and a incision was made over the scar, dissection carried down to the easily palpable port site capsule.  Stay sutures x2 were then cut, prior to removing the port and catheter tip with no resistance.  Pressure was applied to the subclavian site for greater than 5 minutes hemostasis was then visually confirmed prior to closing the skin in a 2 layer fashion using 3-0 Vicryl for the subdermal layer and 4-0 Monocryl for the subcutaneous layer.  Hemostasis was then visually confirmed again prior to dressing the incision with Dermabond.

## 2020-09-14 NOTE — Anesthesia Postprocedure Evaluation (Deleted)
Anesthesia Post Note  Patient: [name]  Procedure(s) Performed: TOTAL MASTECTOMY (Bilateral Breast) REMOVAL PORT-A-CATH IMMEDIATE BILATERAL BREAST RECONSTRUCTION WITH PLACEMENT OF TISSUE EXPANDER AND FLEX HD (ACELLULAR HYDRATED DERMIS) ORDERED 10/22 (Bilateral )  Anesthesia Type: General Anesthetic complications: no   No complications documented.   Last Vitals:  Vitals:   09/14/20 0641 09/14/20 1150  BP: 118/78 136/88  Pulse: 100 (!) 102  Resp: 15 17  Temp: 37.1 C 36.8 C  SpO2: 95% 100%    Last Pain:  Vitals:   09/14/20 0641  TempSrc: Oral  PainSc: 0-No pain                 Claudy Abdallah

## 2020-09-14 NOTE — Progress Notes (Signed)
In pre-operative phase, pt stated she has permanent retainer in place and port. Denies having any other medical devices or metal anywhere in her body. Right limb alert, pink sleeve and allergy band placed on pt's right arm.

## 2020-09-14 NOTE — Anesthesia Postprocedure Evaluation (Signed)
Anesthesia Post Note  Patient: Naleah Kofoed Mckinnon  Procedure(s) Performed: TOTAL MASTECTOMY (Bilateral Breast) REMOVAL PORT-A-CATH IMMEDIATE BILATERAL BREAST RECONSTRUCTION WITH PLACEMENT OF TISSUE EXPANDER AND FLEX HD (ACELLULAR HYDRATED DERMIS) ORDERED 10/22 (Bilateral )  Patient location during evaluation: PACU Anesthesia Type: General Level of consciousness: sedated Pain management: pain level controlled Vital Signs Assessment: post-procedure vital signs reviewed and stable Respiratory status: patient connected to face mask oxygen Cardiovascular status: blood pressure returned to baseline Postop Assessment: no headache Anesthetic complications: no   No complications documented.   Last Vitals:  Vitals:   09/14/20 0641 09/14/20 1150  BP: 118/78 136/88  Pulse: 100 (!) 102  Resp: 15 17  Temp: 37.1 C 36.8 C  SpO2: 95% 100%    Last Pain:  Vitals:   09/14/20 0641  TempSrc: Oral  PainSc: 0-No pain                 Dane Kopke

## 2020-09-14 NOTE — Transfer of Care (Signed)
Immediate Anesthesia Transfer of Care Note  Patient: [name]  Procedure(s) Performed: TOTAL MASTECTOMY (Bilateral Breast) REMOVAL PORT-A-CATH IMMEDIATE BILATERAL BREAST RECONSTRUCTION WITH PLACEMENT OF TISSUE EXPANDER AND FLEX HD (ACELLULAR HYDRATED DERMIS) ORDERED 10/22 (Bilateral )  Patient Location: PACU  Anesthesia Type:General  Level of Consciousness: sedated  Airway & Oxygen Therapy: Patient Spontanous Breathing  Post-op Assessment: Post -op Vital signs reviewed and stable  Post vital signs: stable  Last Vitals:  Vitals Value Taken Time  BP 136/88 09/14/20 1150  Temp 36.8 C 09/14/20 1150  Pulse 101 09/14/20 1157  Resp 18 09/14/20 1157  SpO2 100 % 09/14/20 1157  Vitals shown include unvalidated device data.  Last Pain:  Vitals:   09/14/20 0641  TempSrc: Oral  PainSc: 0-No pain         Complications: No complications documented.

## 2020-09-14 NOTE — Anesthesia Procedure Notes (Signed)
Procedure Name: Intubation Performed by: Johnnye Lana, CRNA Pre-anesthesia Checklist: Patient identified, Patient being monitored, Timeout performed, Emergency Drugs available and Suction available Patient Re-evaluated:Patient Re-evaluated prior to induction Oxygen Delivery Method: Circle system utilized Preoxygenation: Pre-oxygenation with 100% oxygen Induction Type: IV induction Ventilation: Mask ventilation without difficulty Laryngoscope Size: 3 and McGraph Grade View: Grade I Tube type: Oral Tube size: 7.0 mm Number of attempts: 1 Airway Equipment and Method: Stylet Placement Confirmation: ETT inserted through vocal cords under direct vision,  positive ETCO2 and breath sounds checked- equal and bilateral Secured at: 22 cm Tube secured with: Tape Dental Injury: Teeth and Oropharynx as per pre-operative assessment

## 2020-09-15 ENCOUNTER — Encounter: Payer: Self-pay | Admitting: Surgery

## 2020-09-15 DIAGNOSIS — C50511 Malignant neoplasm of lower-outer quadrant of right female breast: Secondary | ICD-10-CM | POA: Diagnosis not present

## 2020-09-15 LAB — BASIC METABOLIC PANEL
Anion gap: 9 (ref 5–15)
BUN: 11 mg/dL (ref 6–20)
CO2: 25 mmol/L (ref 22–32)
Calcium: 9.2 mg/dL (ref 8.9–10.3)
Chloride: 105 mmol/L (ref 98–111)
Creatinine, Ser: 0.78 mg/dL (ref 0.44–1.00)
GFR, Estimated: >60 mL/min (ref >60)
Glucose, Bld: 114 mg/dL — ABNORMAL HIGH (ref 70–99)
Potassium: 3.8 mmol/L (ref 3.5–5.1)
Sodium: 139 mmol/L (ref 135–145)

## 2020-09-15 LAB — CBC
HCT: 30.6 % — ABNORMAL LOW (ref 36.0–46.0)
Hemoglobin: 10.2 g/dL — ABNORMAL LOW (ref 12.0–15.0)
MCH: 30.7 pg (ref 26.0–34.0)
MCHC: 33.3 g/dL (ref 30.0–36.0)
MCV: 92.2 fL (ref 80.0–100.0)
Platelets: 263 10*3/uL (ref 150–400)
RBC: 3.32 MIL/uL — ABNORMAL LOW (ref 3.87–5.11)
RDW: 15.1 % (ref 11.5–15.5)
WBC: 6.3 10*3/uL (ref 4.0–10.5)
nRBC: 0 % (ref 0.0–0.2)

## 2020-09-15 MED ORDER — ENOXAPARIN SODIUM 40 MG/0.4ML ~~LOC~~ SOLN
SUBCUTANEOUS | Status: AC
  Administered 2020-09-15: 40 mg via SUBCUTANEOUS
  Filled 2020-09-15: qty 0.4

## 2020-09-15 MED ORDER — OXYCODONE HCL 5 MG PO TABS
5.0000 mg | ORAL_TABLET | ORAL | 0 refills | Status: AC | PRN
Start: 2020-09-15 — End: 2020-09-18

## 2020-09-15 MED ORDER — DIAZEPAM 2 MG PO TABS
ORAL_TABLET | ORAL | Status: AC
  Administered 2020-09-15: 2 mg via ORAL
  Filled 2020-09-15: qty 1

## 2020-09-15 MED ORDER — DIAZEPAM 2 MG PO TABS: 2.0000 mg | ORAL_TABLET | Freq: Two times a day (BID) | ORAL | 0 refills | Status: DC | PRN

## 2020-09-15 MED ORDER — DOCUSATE SODIUM 100 MG PO CAPS: 100.0000 mg | ORAL_CAPSULE | Freq: Two times a day (BID) | ORAL | 0 refills | Status: DC | PRN

## 2020-09-15 MED ORDER — OXYCODONE HCL 5 MG PO TABS
ORAL_TABLET | ORAL | Status: AC
  Filled 2020-09-15: qty 2

## 2020-09-15 MED ORDER — IBUPROFEN 800 MG PO TABS: 800.0000 mg | ORAL_TABLET | Freq: Three times a day (TID) | ORAL | 0 refills | Status: DC | PRN

## 2020-09-15 MED ORDER — OXYCODONE HCL 5 MG PO TABS
ORAL_TABLET | ORAL | Status: AC
  Administered 2020-09-15: 5 mg via ORAL
  Filled 2020-09-15: qty 2

## 2020-09-15 MED ORDER — CELECOXIB 200 MG PO CAPS
ORAL_CAPSULE | ORAL | Status: AC
  Administered 2020-09-15: 200 mg via ORAL
  Filled 2020-09-15: qty 1

## 2020-09-15 NOTE — Discharge Summary (Signed)
Physician Discharge Summary  Patient ID: [name] MRN: [id number] DOB/AGE: 12/23/1980 39 y.o.  Admit date: 09/14/2020 Discharge date: 09/15/20  Admission Diagnoses: Breast cancer  Discharge Diagnoses:  Same as above  Discharged Condition: good  Hospital Course: Admitted for above.  Underwent double mastectomy with immediate reconstruction.  Please see op notes for details.  Postop recovered as expected.  At time of discharge, drain output minimal sanguinous with less than 50 mL over the past 24 hours bilaterally, pain control with oral medications, and tolerating a diet.  Reviewed discharge instructions regarding activity restrictions and antibiotic per plastic surgery recommendations  Consults: None  Discharge Exam: Blood pressure (!) (P) 105/57, pulse (P) 87, temperature (P) 98.4 F (36.9 C), temperature source (P) Tympanic, resp. rate (P) 20, height 5' 7.5" (1.715 m), weight 95.7 kg, last menstrual period 05/06/2010, SpO2 (P) 95 %. General appearance: alert, cooperative and no distress Skin: Bilateral incision dressings clean dry and intact.  JP with sanguinous drainage.  Disposition:  Discharge disposition: 01-Home or Self Care       Discharge Instructions    Discharge patient   Complete by: As directed    Discharge disposition: 01-Home or Self Care   Discharge patient date: 09/15/2020     Allergies as of 09/15/2020      Reactions   Bee Venom Shortness Of Breath, Swelling   Swelling at site    Fish Allergy Anaphylaxis, Shortness Of Breath   Latex Hives, Shortness Of Breath, Swelling, Rash   Penicillins Hives, Itching   Has patient had a PCN reaction causing immediate rash, facial/tongue/throat swelling, SOB or lightheadedness with hypotension: Yes Has patient had a PCN reaction causing severe rash involving mucus membranes or skin necrosis: Yes Has patient had a PCN reaction that required hospitalization No Has patient had a PCN reaction occurring  within the last 10 years: No If all of the above answers are "NO", then may proceed with Cephalosporin use.   Adhesive [tape] Other (See Comments)   Removes skin even after only 24 hours Takes off skin   Ciprofloxacin Rash   Arm redness and swelling within mins of starting IV Cipro. Treated with benadryl      Medication List    TAKE these medications   albuterol 108 (90 Base) MCG/ACT inhaler Commonly known as: VENTOLIN HFA Inhale 2 puffs into the lungs every 6 (six) hours as needed.   ALPRAZolam 0.5 MG tablet Commonly known as: XANAX Take 1 tablet (0.5 mg total) by mouth 3 (three) times daily as needed for anxiety.   bisacodyl 5 MG EC tablet Commonly known as: DULCOLAX Take 5 mg by mouth 2 (two) times daily as needed for moderate constipation.   diazepam 2 MG tablet Commonly known as: Valium Take 1 tablet (2 mg total) by mouth every 12 (twelve) hours as needed for muscle spasms.   docusate sodium 100 MG capsule Commonly known as: COLACE Take 100 mg by mouth daily as needed for mild constipation. What changed: Another medication with the same name was added. Make sure you understand how and when to take each.   docusate sodium 100 MG capsule Commonly known as: Colace Take 1 capsule (100 mg total) by mouth 2 (two) times daily as needed for up to 10 days for mild constipation. What changed: You were already taking a medication with the same name, and this prescription was added. Make sure you understand how and when to take each.   EPINEPHrine 0.3 mg/0.3 mL Soaj injection Commonly  known as: EPI-PEN Inject 0.3 mg into the muscle once as needed.   HAIR/SKIN/NAILS PO Take 1 tablet by mouth daily.   ibuprofen 800 MG tablet Commonly known as: ADVIL Take 1 tablet (800 mg total) by mouth every 8 (eight) hours as needed for mild pain or moderate pain.   lidocaine-prilocaine cream Commonly known as: EMLA Apply to affected area once   multivitamin with minerals tablet Take 1  tablet by mouth daily.   OLANZapine 10 MG tablet Commonly known as: ZYPREXA TAKE 1 TABLET BY MOUTH EVERYDAY AT BEDTIME What changed: See the new instructions.   ondansetron 8 MG tablet Commonly known as: Zofran Take 1 tablet (8 mg total) by mouth 2 (two) times daily as needed for refractory nausea / vomiting. Start on day 3 after chemo.   ondansetron 4 MG tablet Commonly known as: Zofran Take 1 tablet (4 mg total) by mouth every 8 (eight) hours as needed for nausea or vomiting.   oxyCODONE 5 MG immediate release tablet Commonly known as: Oxy IR/ROXICODONE Take 1-2 tablets (5-10 mg total) by mouth every 4 (four) hours as needed for up to 3 days for severe pain.   prochlorperazine 10 MG tablet Commonly known as: COMPAZINE Take 1 tablet (10 mg total) by mouth every 6 (six) hours as needed (Nausea or vomiting).   promethazine 25 MG tablet Commonly known as: PHENERGAN Take 0.5-1 tablets (12.5-25 mg total) by mouth every 6 (six) hours as needed for nausea or vomiting.   sertraline 100 MG tablet Commonly known as: ZOLOFT Take 1 tablet (100 mg total) by mouth daily. What changed: when to take this   traZODone 50 MG tablet Commonly known as: DESYREL Take 1-2 tablets (50-100 mg total) by mouth at bedtime as needed for sleep.       Follow-up Information    Dillingham, Loel Lofty, DO In 10 days.   Specialty: Plastic Surgery Contact information: [address] [place] [address] [phone]        Benjamine Sprague, DO In 2 weeks.   Specialty: Surgery Why: post op Contact information: [address] [place] [address] [phone]                Total time spent arranging discharge was >46min. Signed: Benjamine Sprague 09/15/2020, 9:43 AM

## 2020-09-15 NOTE — Discharge Instructions (Signed)
INSTRUCTIONS FOR AFTER SURGERY   You will likely have some questions about what to expect following your operation.  The following information will help you and your family understand what to expect when you are discharged from the hospital.  Following these guidelines will help ensure a smooth recovery and reduce risks of complications.  Postoperative instructions include information on: diet, wound care, medications and physical activity.  AFTER SURGERY Expect to go home after the procedure.  In some cases, you may need to spend one night in the hospital for observation.  DIET This surgery does not require a specific diet.  However, I have to mention that the healthier you eat the better your body can start healing. It is important to increasing your protein intake.  This means limiting the foods with added sugar.  Focus on fruits and vegetables and some meat. It is very important to drink water after your surgery.  If your urine is bright yellow, then it is concentrated, and you need to drink more water.  As a general rule after surgery, you should have 8 ounces of water every hour while awake.  If you find you are persistently nauseated or unable to take in liquids let us know.  NO TOBACCO USE or EXPOSURE.  This will slow your healing process and increase the risk of a wound.  WOUND CARE Clean with baby wipes for 3 days and then can shower.   If you have steri-strips / tape directly attached to your skin leave them in place. It is OK to get these wet.  No baths, pools or hot tubs for two weeks. We close your incision to leave the smallest and best-looking scar. No ointment or creams on your incisions until given the go ahead.  Especially not Neosporin (Too many skin reactions with this one).  A few weeks after surgery you can use Mederma and start massaging the scar. We ask you to wear your binder or sports bra for the first 6 weeks around the clock, including while sleeping. This provides added  comfort and helps reduce the fluid accumulation at the surgery site.  ACTIVITY No heavy lifting until cleared by the doctor.  It is OK to walk and climb stairs. In fact, moving your legs is very important to decrease your risk of a blood clot.  It will also help keep you from getting deconditioned.  Every 1 to 2 hours get up and walk for 5 minutes. This will help with a quicker recovery back to normal.  Let pain be your guide so you don't do too much.  NO, you cannot do the spring cleaning and don't plan on taking care of anyone else.  This is your time for TLC.   WORK Everyone returns to work at different times. As a rough guide, most people take at least 1 - 2 weeks off prior to returning to work. If you need documentation for your job, bring the forms to your postoperative follow up visit.  DRIVING Arrange for someone to bring you home from the hospital.  You may be able to drive a few days after surgery but not while taking any narcotics or valium.  BOWEL MOVEMENTS Constipation can occur after anesthesia and while taking pain medication.  It is important to stay ahead for your comfort.  We recommend taking Milk of Magnesia (2 tablespoons; twice a day) while taking the pain pills.  SEROMA This is fluid your body tried to put in the surgical site.  This is normal but if it creates excessive pain and swelling let us know.  It usually decreases in a few weeks.  MEDICATIONS and PAIN CONTROL At your preoperative visit for you history and physical you were given the following medications: 1. An antibiotic: Start this medication when you get home and take according to the instructions on the bottle. 2. Zofran 4 mg:  This is to treat nausea and vomiting.  You can take this every 6 hours as needed and only if needed. 3. Oxycodone:  This is only to be used after you have taken the motrin or the tylenol. Every 8 hours as needed. Over the counter Medication to take: 4. Ibuprofen (Motrin) 800 mg:  Take  this every 8 hours.  If you have additional pain then take 500 mg of the tylenol.  Only take the oxycodone after you have tried these two. 5. Miralax or stool softener of choice: Take this according to the bottle if you take the [place] Call your surgeon's office if any of the following occur: . Fever 101 degrees F or greater . Excessive bleeding or fluid from the incision site. . Pain that increases over time without aid from the medications . Redness, warmth, or pus draining from incision sites . Persistent nausea or inability to take in liquids . Severe misshapen area that underwent the operation.  [facility] Plastic Surgery Specialist  What is the benefit of having a drain?  During surgery your tissue layers are separated.  This raw surface stimulates your body to fill the space with serous fluid.  This is normal but you don't want that fluid to collect and prevent healing.  A fluid collection can also become infected.  The Jackson-Pratt (JP) drain is used to eliminate this collection of fluid and allow the tissue to heal together.    Jackson-Pratt (JP) bulb    How to care for your drainage and suction unit at home Your drainage catheter will be connected to a collection device. The vacuum caused when the device is compressed allows drainage to collect in the device.    Wendee Copp your hands with soap and water before and after touching the system. . Empty the JP drain every 12 hours once you get home from your procedure. . Record the fluid amount on the record sheet included. . Start with stripping the drain tube to push the clots or excess fluid to the bulb.  Do this by pinching the tube with one hand near your skin.  Then with the other hand squeeze the tubing and work it toward the bulb.  This should be done several times a day.  This may collapse the tube which will correct on its own.   . Use a safety pin to attach your collection device to your clothing so there is no tension  on the insertion site.   . If you have drainage at the skin insertion site, you can apply a gauze dressing and secure it with tape. . If the drain falls out, apply a gauze dressing over the drain insertion site and secure with tape.   To empty the collection device:   . Release the stopper on the top of the collection unit (bulb).  Signa Kell contents into a measuring container such as a plastic medicine cup.  . Record the day and amount of drainage on the attached sheet. . This should be done at least twice a day.    To compress the  Jackson-Pratt Bulb:  . Release the stopper at the top of the bulb. Marland Kitchen Squeeze the bulb tightly in your fist, squeezing air out of the bulb.  . Replace the stopper while the bulb is compressed.  . Be careful not to spill the contents when squeezing the bulb. . The drainage will start bright red and turn to pink and then yellow with time. . IMPORTANT: If the bulb is not squeezed before adding the stopper it will not draw out the fluid.  Care for the JP drain site and your skin daily:  . You may shower three days after surgery. . Secure the drain to a ribbon or cloth around your waist while showering so it does not pull out while showering. . Be sure your hands are cleaned with soap and water. . Use a clean wet cotton swab to clean the skin around the drain site.  . Use another cotton swab to place Vaseline or antibiotic ointment on the skin around the drain.     Contact your physician if any of the following occur:  Marland Kitchen The fluid in the bulb becomes cloudy. . Your temperature is greater than 101.4.  Marland Kitchen The incision opens. . If you have drainage at the skin insertion site, you can apply a gauze dressing and secure it with tape. . If the drain falls out, apply a gauze dressing over the drain insertion site and secure with tape.  . You will usually have more drainage when you are active than while you rest or are asleep. If the drainage increases significantly or is  bloody call the physician                             Bring this record with you to each office visit Date  Drainage Volume  Date   Drainage volume

## 2020-09-18 ENCOUNTER — Other Ambulatory Visit: Payer: Self-pay | Admitting: Oncology

## 2020-09-18 LAB — SURGICAL PATHOLOGY

## 2020-09-22 ENCOUNTER — Other Ambulatory Visit: Payer: Self-pay

## 2020-09-22 ENCOUNTER — Encounter: Payer: Self-pay | Admitting: Plastic Surgery

## 2020-09-22 ENCOUNTER — Ambulatory Visit (INDEPENDENT_AMBULATORY_CARE_PROVIDER_SITE_OTHER): Payer: Medicaid Other | Admitting: Plastic Surgery

## 2020-09-22 DIAGNOSIS — Z9013 Acquired absence of bilateral breasts and nipples: Secondary | ICD-10-CM

## 2020-09-22 NOTE — Progress Notes (Signed)
   Subjective:    Patient ID: [name], female    DOB: 05-22-1981, 39 y.o.   MRN: [id number]  The patient is a 39 year old female here for follow-up after undergoing her breast surgery.  She had bilateral mastectomies with 535 cc expanders placed.  She had 200 cc of saline placed at the time of her surgery.  Her drains are in place and seem to be working well.  The output is minimal.  There is no sign of hematoma or seroma.  Her incisions are healing nicely.  She is sleeping okay and is using the bathroom without difficulty.     Review of Systems  Constitutional: Positive for activity change.  HENT: Negative.   Eyes: Negative.   Genitourinary: Negative.        Objective:   Physical Exam Vitals and nursing note reviewed.  Constitutional:      Appearance: Normal appearance.  Cardiovascular:     Rate and Rhythm: Normal rate.     Pulses: Normal pulses.  Pulmonary:     Effort: Pulmonary effort is normal.  Neurological:     General: No focal deficit present.     Mental Status: She is alert. Mental status is at baseline.  Psychiatric:        Mood and Affect: Mood normal.        Behavior: Behavior normal.           Assessment & Plan:     ICD-10-CM   1. Absence of breast, acquired, bilateral  Z90.13     We will plan to remove the drains next week.  She can go into a sports bra and shower. We placed injectable saline in the Expander using a sterile technique: Right: 50 cc for a total of 250 / 535 cc Left: 50 cc for a total of 250 / 535 cc

## 2020-09-25 ENCOUNTER — Other Ambulatory Visit: Payer: Self-pay | Admitting: Oncology

## 2020-09-25 ENCOUNTER — Other Ambulatory Visit: Payer: Self-pay | Admitting: Hospice and Palliative Medicine

## 2020-09-25 MED ORDER — ALPRAZOLAM 0.5 MG PO TABS: 0.5000 mg | ORAL_TABLET | Freq: Three times a day (TID) | ORAL | 0 refills | Status: DC | PRN

## 2020-09-25 MED ORDER — SERTRALINE HCL 100 MG PO TABS
100.0000 mg | ORAL_TABLET | Freq: Every day | ORAL | 3 refills | Status: DC
Start: 2020-09-25 — End: 2020-12-03

## 2020-09-25 NOTE — Progress Notes (Signed)
Refills requested for alprazolam and sertraline.

## 2020-10-02 ENCOUNTER — Ambulatory Visit (INDEPENDENT_AMBULATORY_CARE_PROVIDER_SITE_OTHER): Payer: Medicaid Other | Admitting: Surgical

## 2020-10-02 ENCOUNTER — Encounter: Payer: Self-pay | Admitting: Surgical

## 2020-10-02 ENCOUNTER — Other Ambulatory Visit: Payer: Self-pay

## 2020-10-02 VITALS — BP 109/78 | HR 112 | Temp 98.1°F

## 2020-10-02 DIAGNOSIS — Z9013 Acquired absence of bilateral breasts and nipples: Secondary | ICD-10-CM

## 2020-10-02 DIAGNOSIS — C50411 Malignant neoplasm of upper-outer quadrant of right female breast: Secondary | ICD-10-CM

## 2020-10-02 DIAGNOSIS — Z170 Estrogen receptor positive status [ER+]: Secondary | ICD-10-CM

## 2020-10-02 MED ORDER — DIAZEPAM 2 MG PO TABS: 2.0000 mg | ORAL_TABLET | Freq: Two times a day (BID) | ORAL | 0 refills | Status: DC | PRN

## 2020-10-02 NOTE — Progress Notes (Signed)
Patient is a 39 year old female here for follow-up after undergoing bilateral mastectomies with bilateral breast reconstruction by Dr. [name] and Dr. Marla Roe on 09/14/2020.  Patient reports Dr. [name] removed her drains last week and she has been doing well since then.  She is otherwise doing well and has no complaints.  She reports that she has no more Valium left as she needed it after her last fill for muscle cramps.  She is requesting a new prescription for this.  She is not having any infectious symptoms. She reports that she is still out of work, she is waiting on long-term disability to provide her with information about her disability claim.  She is going to send this any paperwork she received from them for [place] to fill out.  I did discuss with her that typically our patients have lifting restrictions of no greater than 15 pounds for 6 weeks.   Chaperone present on exam Bilateral breast incisions intact.  Bilateral mastectomy flaps are viable.  No necrosis or incisional dehiscence noted.  No fluid wave noted on exam.  No erythema noted.   We placed injectable saline in the Expander using a sterile technique: Right: 60 cc for a total of 310 / 535 cc Left: 60 cc for a total of 310 / 535 cc  Recommend following up in 2 weeks for reevaluation and an additional fill.  Patient tolerated today's fill just fine without any issues.  No sign of infection.

## 2020-10-12 ENCOUNTER — Telehealth: Payer: Self-pay | Admitting: Surgical

## 2020-10-12 ENCOUNTER — Other Ambulatory Visit: Payer: Self-pay

## 2020-10-12 ENCOUNTER — Encounter: Payer: Self-pay | Admitting: Surgical

## 2020-10-12 ENCOUNTER — Ambulatory Visit (INDEPENDENT_AMBULATORY_CARE_PROVIDER_SITE_OTHER): Payer: Medicaid Other | Admitting: Surgical

## 2020-10-12 VITALS — BP 125/84 | HR 97 | Temp 98.9°F

## 2020-10-12 DIAGNOSIS — C50411 Malignant neoplasm of upper-outer quadrant of right female breast: Secondary | ICD-10-CM

## 2020-10-12 DIAGNOSIS — Z170 Estrogen receptor positive status [ER+]: Secondary | ICD-10-CM

## 2020-10-12 DIAGNOSIS — Z9013 Acquired absence of bilateral breasts and nipples: Secondary | ICD-10-CM

## 2020-10-12 MED ORDER — DOXYCYCLINE HYCLATE 100 MG PO TABS: 100.0000 mg | ORAL_TABLET | Freq: Two times a day (BID) | ORAL | 0 refills | Status: AC

## 2020-10-12 NOTE — Telephone Encounter (Signed)
Patient called to say Catalina Antigua was supposed to call in antibiotic for doxycycline but her pharmacy has not received. I told her I would send a message to [facility] requesting him to call it in for her to [facility] on [address] in [place].

## 2020-10-12 NOTE — Telephone Encounter (Signed)
Ordered antibiotic for patient

## 2020-10-12 NOTE — Progress Notes (Signed)
Patient is a 40 year old female here for follow-up after undergoing bilateral mastectomies with Dr. Tonna Boehringer followed by bilateral breast reconstruction with Dr. Ulice Bold on 09/14/2020.  Patient reports that she is overall doing well, she does report that she noticed some redness of the left breast last week.  She reports she has not had any infectious symptoms.  She reports that she did get a spray tan yesterday.  Chaperone present on exam On exam bilateral breast incisions intact, left breast with some erythema and cellulitic changes along the left lateral aspect.  There is a little bit of fluid with palpation, no drainage noted.  No foul odor is noted.  No wounds noted.   We opted to not fill the expander today due to the erythema of the left breast. She currently has a total of: Right: 310 cc/535 cc Left: 310 cc/535 cc  Symptom prescription for 10 days of doxycycline due to the erythema of the left breast, I was able to aspirate some serosanguineous fluid from the left breast overlying the expander.  There was no purulence noted.  No foul odor noted.  Patient tolerated this fine, sterile technique was used. Discussed with patient to avoid any spray tanning of the breast area in the future. I recommend she call us with any questions or concerns.  I did discuss with her that if the erythema worsens, we recommend she calls [place].  Would like patient to follow-up in 1 week.

## 2020-10-15 ENCOUNTER — Encounter: Payer: Self-pay | Admitting: Oncology

## 2020-10-15 ENCOUNTER — Encounter: Payer: Self-pay | Admitting: *Deleted

## 2020-10-15 ENCOUNTER — Inpatient Hospital Stay: Payer: Medicaid Other | Attending: Oncology

## 2020-10-15 ENCOUNTER — Inpatient Hospital Stay (HOSPITAL_BASED_OUTPATIENT_CLINIC_OR_DEPARTMENT_OTHER): Payer: Medicaid Other | Admitting: Oncology

## 2020-10-15 ENCOUNTER — Other Ambulatory Visit: Payer: Self-pay

## 2020-10-15 VITALS — BP 125/90 | HR 96 | Temp 98.9°F | Wt 211.6 lb

## 2020-10-15 DIAGNOSIS — Z170 Estrogen receptor positive status [ER+]: Secondary | ICD-10-CM

## 2020-10-15 DIAGNOSIS — C50411 Malignant neoplasm of upper-outer quadrant of right female breast: Secondary | ICD-10-CM

## 2020-10-15 DIAGNOSIS — Z7189 Other specified counseling: Secondary | ICD-10-CM | POA: Diagnosis not present

## 2020-10-15 DIAGNOSIS — Z79899 Other long term (current) drug therapy: Secondary | ICD-10-CM | POA: Insufficient documentation

## 2020-10-15 LAB — COMPREHENSIVE METABOLIC PANEL
ALT: 44 U/L (ref 0–44)
AST: 58 U/L — ABNORMAL HIGH (ref 15–41)
Albumin: 4.1 g/dL (ref 3.5–5.0)
Alkaline Phosphatase: 65 U/L (ref 38–126)
Anion gap: 10 (ref 5–15)
BUN: 10 mg/dL (ref 6–20)
CO2: 23 mmol/L (ref 22–32)
Calcium: 9.3 mg/dL (ref 8.9–10.3)
Chloride: 106 mmol/L (ref 98–111)
Creatinine, Ser: 0.63 mg/dL (ref 0.44–1.00)
GFR, Estimated: >60 mL/min (ref >60)
Glucose, Bld: 101 mg/dL — ABNORMAL HIGH (ref 70–99)
Potassium: 3.8 mmol/L (ref 3.5–5.1)
Sodium: 139 mmol/L (ref 135–145)
Total Bilirubin: 0.3 mg/dL (ref 0.3–1.2)
Total Protein: 6.9 g/dL (ref 6.5–8.1)

## 2020-10-15 LAB — CBC WITH DIFFERENTIAL/PLATELET
Abs Immature Granulocytes: 0.01 10*3/uL (ref 0.00–0.07)
Basophils Absolute: 0 10*3/uL (ref 0.0–0.1)
Basophils Relative: 1 %
Eosinophils Absolute: 0.4 10*3/uL (ref 0.0–0.5)
Eosinophils Relative: 8 %
HCT: 35.6 % — ABNORMAL LOW (ref 36.0–46.0)
Hemoglobin: 11.7 g/dL — ABNORMAL LOW (ref 12.0–15.0)
Immature Granulocytes: 0 %
Lymphocytes Relative: 38 %
Lymphs Abs: 1.8 10*3/uL (ref 0.7–4.0)
MCH: 27.3 pg (ref 26.0–34.0)
MCHC: 32.9 g/dL (ref 30.0–36.0)
MCV: 83.2 fL (ref 80.0–100.0)
Monocytes Absolute: 0.4 10*3/uL (ref 0.1–1.0)
Monocytes Relative: 8 %
Neutro Abs: 2.1 10*3/uL (ref 1.7–7.7)
Neutrophils Relative %: 45 %
Platelets: 227 10*3/uL (ref 150–400)
RBC: 4.28 MIL/uL (ref 3.87–5.11)
RDW: 13.1 % (ref 11.5–15.5)
WBC: 4.7 10*3/uL (ref 4.0–10.5)
nRBC: 0 % (ref 0.0–0.2)
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
## 2020-10-15 NOTE — Progress Notes (Signed)
Pt has nausea in am and takes zofran and it helps, she does not eat normal but it is normal for her. She is on atb for left breast red and irritated and pt wants to know about AI starting.

## 2020-10-18 NOTE — Progress Notes (Signed)
Patient is a 40 year old female here for follow-up after undergoing bilateral breast mastectomies with Dr. Tonna Boehringer and placement of tissue expanders and Flex HD with Dr. Ulice Bold on 09/14/2020.  At last visit on 12/27 patient had erythema of the left breast and some serosanguineous fluid was aspirated from the left breast overlying the expander.  Patient was placed on 10 days of doxycycline.  ~ 5 weeks PO Patient reports she is doing well. She has a few days of the doxycycline remaining. Redness of the left breast has fully resolved. Denies fever/chills, nausea/vomiting, breast pain. Bilateral incisions are intact, clean and dry. No signs of infection or drainage. Patient reports she did very well with her last fill and would like an additional fill today. We will do a conservative fill today. Recommend finishing out her antibiotics. Follow-up in 2 weeks for additional fill. Return precautions provided. Call office with any questions/concerns.  We placed injectable saline in the Expander using a sterile technique: Right: 35 cc for a total of 345 / 535 cc Left: 35 cc for a total of 345 / 535 cc

## 2020-10-19 ENCOUNTER — Other Ambulatory Visit: Payer: Self-pay

## 2020-10-19 ENCOUNTER — Encounter: Payer: Self-pay | Admitting: Plastic Surgery

## 2020-10-19 ENCOUNTER — Ambulatory Visit (INDEPENDENT_AMBULATORY_CARE_PROVIDER_SITE_OTHER): Payer: Medicaid Other | Admitting: Plastic Surgery

## 2020-10-19 VITALS — BP 116/79 | HR 101 | Temp 98.6°F

## 2020-10-19 DIAGNOSIS — C50411 Malignant neoplasm of upper-outer quadrant of right female breast: Secondary | ICD-10-CM

## 2020-10-19 DIAGNOSIS — Z170 Estrogen receptor positive status [ER+]: Secondary | ICD-10-CM

## 2020-10-19 DIAGNOSIS — Z9013 Acquired absence of bilateral breasts and nipples: Secondary | ICD-10-CM

## 2020-10-21 ENCOUNTER — Inpatient Hospital Stay: Payer: Medicaid Other | Attending: Oncology | Admitting: Occupational Therapy

## 2020-10-21 DIAGNOSIS — M25612 Stiffness of left shoulder, not elsewhere classified: Secondary | ICD-10-CM

## 2020-10-21 DIAGNOSIS — M25611 Stiffness of right shoulder, not elsewhere classified: Secondary | ICD-10-CM

## 2020-10-21 DIAGNOSIS — Z5111 Encounter for antineoplastic chemotherapy: Secondary | ICD-10-CM | POA: Insufficient documentation

## 2020-10-21 DIAGNOSIS — Z170 Estrogen receptor positive status [ER+]: Secondary | ICD-10-CM | POA: Insufficient documentation

## 2020-10-21 DIAGNOSIS — C50411 Malignant neoplasm of upper-outer quadrant of right female breast: Secondary | ICD-10-CM | POA: Insufficient documentation
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
## 2020-10-29 ENCOUNTER — Other Ambulatory Visit: Payer: Self-pay

## 2020-10-29 ENCOUNTER — Inpatient Hospital Stay: Payer: Medicaid Other

## 2020-10-29 DIAGNOSIS — C50411 Malignant neoplasm of upper-outer quadrant of right female breast: Secondary | ICD-10-CM | POA: Diagnosis not present

## 2020-10-29 DIAGNOSIS — Z170 Estrogen receptor positive status [ER+]: Secondary | ICD-10-CM

## 2020-10-29 DIAGNOSIS — Z5111 Encounter for antineoplastic chemotherapy: Secondary | ICD-10-CM | POA: Diagnosis present

## 2020-10-29 MED ORDER — LEUPROLIDE ACETATE 3.75 MG IM KIT
3.7500 mg | PACK | Freq: Once | INTRAMUSCULAR | Status: AC
  Administered 2020-10-29: 3.75 mg via INTRAMUSCULAR
  Filled 2020-10-29: qty 3.75

## 2020-11-04 ENCOUNTER — Other Ambulatory Visit: Payer: Self-pay

## 2020-11-04 ENCOUNTER — Encounter: Payer: Self-pay | Admitting: Surgical

## 2020-11-04 ENCOUNTER — Ambulatory Visit (INDEPENDENT_AMBULATORY_CARE_PROVIDER_SITE_OTHER): Payer: Medicaid Other | Admitting: Surgical

## 2020-11-04 VITALS — BP 120/77 | HR 82

## 2020-11-04 DIAGNOSIS — C50411 Malignant neoplasm of upper-outer quadrant of right female breast: Secondary | ICD-10-CM

## 2020-11-04 DIAGNOSIS — Z170 Estrogen receptor positive status [ER+]: Secondary | ICD-10-CM

## 2020-11-04 DIAGNOSIS — Z9013 Acquired absence of bilateral breasts and nipples: Secondary | ICD-10-CM

## 2020-11-04 NOTE — Progress Notes (Signed)
Patient is a 40 year old female here for follow-up after bilateral mastectomies and bilateral breast reconstruction on 09/14/2020.  Patient underwent mastectomies with Dr. [name] followed by reconstruction with Dr. Marla Roe.  Patient previously had some erythema of the left breast, this has resolved.  She completed her previous course of antibiotic.  She reports that she is doing well.  She is not having any infectious symptoms.   Chaperone present on exam Bilateral breast incisions intact.  No erythema noted of either breast.  No swelling noted.  No tenderness to palpation noted.  Right breast skin is slightly firmer than the left breast skin, but she has fair amount of laxity.   Patient reports she tolerated last fill fine, she would like a larger fill at this time.  We placed injectable saline in the Expander using a sterile technique: Right: 80 cc for a total of 425/ 535 cc Left: 80 cc for a total of 425/ 535 cc  Recommend following up in 2 weeks for reevaluation.  There is no sign of infection, seroma, hematoma.

## 2020-11-19 ENCOUNTER — Other Ambulatory Visit: Payer: Self-pay

## 2020-11-19 ENCOUNTER — Ambulatory Visit (INDEPENDENT_AMBULATORY_CARE_PROVIDER_SITE_OTHER): Payer: Medicaid Other | Admitting: Surgical

## 2020-11-19 ENCOUNTER — Encounter: Payer: Self-pay | Admitting: Surgical

## 2020-11-19 VITALS — BP 115/74 | HR 103

## 2020-11-19 DIAGNOSIS — Z9013 Acquired absence of bilateral breasts and nipples: Secondary | ICD-10-CM

## 2020-11-19 DIAGNOSIS — C50411 Malignant neoplasm of upper-outer quadrant of right female breast: Secondary | ICD-10-CM

## 2020-11-19 DIAGNOSIS — Z170 Estrogen receptor positive status [ER+]: Secondary | ICD-10-CM

## 2020-11-19 MED ORDER — DIAZEPAM 2 MG PO TABS: 2.0000 mg | ORAL_TABLET | Freq: Two times a day (BID) | ORAL | 0 refills | Status: DC | PRN

## 2020-11-19 NOTE — Progress Notes (Signed)
Patient is a 40 year old female here for follow-up on her bilateral breast reconstruction with Dr. Marla Roe. Surgery was 09/14/2020.  Patient reports she is doing well.  She reports a little bit of pain after her last expander fill, however she reports that she is doing well now.  She reports she would like an additional fill today.  Chaperone present on exam On exam bilateral breast incisions are intact, mastectomy flaps are well-healed.  No necrosis noted.  No erythema noted.  She does have some superior pole volume loss on the left breast.  She is otherwise fairly symmetric.   We placed injectable saline in the Expander using a sterile technique: Right: 100 cc for a total of 525 / 535 cc Left: 100 cc for a total of 525 / 535 cc  Patient is doing well, she is very pleased with her reconstruction so far.  She requested a larger fill today and felt comfortable tolerating more fluid.  She had good laxity of her skin and still has good thickness of the mastectomy flaps at this point.  Recommend following up in 2 weeks for reevaluation.  There is no sign of infection, seroma, hematoma.  We will plan to begin preparing for exchanging the expanders for implants.  I discussed with the patient that we typically do this 3 months postoperatively to allow for the ADM to incorporate.  I will send the information to our surgical scheduling team to plan for this.  I discussed with the patient that this would likely be end of February to mid March.  Pictures were obtained of the patient and placed in the chart with the patient's or guardian's permission.

## 2020-11-27 ENCOUNTER — Telehealth: Payer: Self-pay | Admitting: Oncology

## 2020-11-27 ENCOUNTER — Inpatient Hospital Stay: Payer: Medicaid Other

## 2020-11-27 ENCOUNTER — Encounter: Payer: Self-pay | Admitting: Oncology

## 2020-11-27 ENCOUNTER — Inpatient Hospital Stay: Payer: Medicaid Other | Attending: Oncology | Admitting: Oncology

## 2020-11-27 VITALS — BP 117/83 | HR 90 | Temp 96.9°F | Wt 208.0 lb

## 2020-11-27 DIAGNOSIS — Z170 Estrogen receptor positive status [ER+]: Secondary | ICD-10-CM

## 2020-11-27 DIAGNOSIS — G4709 Other insomnia: Secondary | ICD-10-CM

## 2020-11-27 DIAGNOSIS — C50411 Malignant neoplasm of upper-outer quadrant of right female breast: Secondary | ICD-10-CM

## 2020-11-27 DIAGNOSIS — Z79899 Other long term (current) drug therapy: Secondary | ICD-10-CM

## 2020-11-27 DIAGNOSIS — Z5111 Encounter for antineoplastic chemotherapy: Secondary | ICD-10-CM | POA: Insufficient documentation

## 2020-11-27 MED ORDER — ANASTROZOLE 1 MG PO TABS: 1.0000 mg | ORAL_TABLET | Freq: Every day | ORAL | 3 refills | Status: DC

## 2020-11-27 MED ORDER — LEUPROLIDE ACETATE (3 MONTH) 11.25 MG IM KIT
11.2500 mg | PACK | Freq: Once | INTRAMUSCULAR | Status: AC
  Administered 2020-11-27: 11.25 mg via INTRAMUSCULAR
  Filled 2020-11-27: qty 11.25
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
## 2020-11-27 NOTE — Progress Notes (Signed)
Survivorship Care Plan visit completed.  Treatment summary reviewed and given to patient.  ASCO answers booklet reviewed and given to patient.  CARE program and Cancer Transitions discussed with patient along with other resources cancer center offers to patients and caregivers.  Patient verbalized understanding.    Patient in agreement for APP to have a Virtual visit to introduce them to the Survivorship Clinic.  Encouraged patient to call for any questions or concerns. 

## 2020-11-27 NOTE — Telephone Encounter (Signed)
Called pt to review bone scan appt on 5/5 and 5/12. Pt confirmed dates/times and stated she has access to Mychart.

## 2020-11-29 NOTE — Progress Notes (Deleted)
Patient is a 40 yr-old female here for follow-up after undergoing bilateral breast mastectomies with Dr. [name] and placement of tissue expanders and Flex HD with Dr. Marla Roe on 09/14/20.    ~ 11 weeks PO  Currently scheduled for exchange to implants on March 23.  100 cc at last visit We placed injectable saline in the Expander using a sterile technique: Right: *** cc for a total of 525 / 535 cc Left: *** cc for a total of 525 / 535 cc

## 2020-12-03 ENCOUNTER — Inpatient Hospital Stay (HOSPITAL_BASED_OUTPATIENT_CLINIC_OR_DEPARTMENT_OTHER): Payer: Medicaid Other | Admitting: Nurse Practitioner

## 2020-12-03 ENCOUNTER — Ambulatory Visit: Payer: Medicaid Other | Admitting: Plastic Surgery

## 2020-12-03 DIAGNOSIS — G620 Drug-induced polyneuropathy: Secondary | ICD-10-CM

## 2020-12-03 DIAGNOSIS — T451X5A Adverse effect of antineoplastic and immunosuppressive drugs, initial encounter: Secondary | ICD-10-CM

## 2020-12-03 DIAGNOSIS — Z170 Estrogen receptor positive status [ER+]: Secondary | ICD-10-CM | POA: Diagnosis not present

## 2020-12-03 DIAGNOSIS — C801 Malignant (primary) neoplasm, unspecified: Secondary | ICD-10-CM

## 2020-12-03 DIAGNOSIS — Z9013 Acquired absence of bilateral breasts and nipples: Secondary | ICD-10-CM

## 2020-12-03 DIAGNOSIS — C50411 Malignant neoplasm of upper-outer quadrant of right female breast: Secondary | ICD-10-CM

## 2020-12-03 DIAGNOSIS — F411 Generalized anxiety disorder: Secondary | ICD-10-CM | POA: Diagnosis not present

## 2020-12-03 MED ORDER — GABAPENTIN 300 MG PO CAPS: 300.0000 mg | ORAL_CAPSULE | Freq: Every evening | ORAL | 2 refills | Status: DC | PRN

## 2020-12-03 MED ORDER — SERTRALINE HCL 100 MG PO TABS: 150.0000 mg | ORAL_TABLET | Freq: Every day | ORAL | 2 refills | Status: DC
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
## 2020-12-10 ENCOUNTER — Ambulatory Visit (INDEPENDENT_AMBULATORY_CARE_PROVIDER_SITE_OTHER): Payer: Medicaid Other | Admitting: Surgical

## 2020-12-10 ENCOUNTER — Encounter: Payer: Self-pay | Admitting: Surgical

## 2020-12-10 ENCOUNTER — Other Ambulatory Visit: Payer: Self-pay

## 2020-12-10 VITALS — BP 124/87 | HR 84 | Temp 97.7°F | Ht 67.5 in | Wt 206.0 lb

## 2020-12-10 DIAGNOSIS — Z170 Estrogen receptor positive status [ER+]: Secondary | ICD-10-CM

## 2020-12-10 DIAGNOSIS — Z9013 Acquired absence of bilateral breasts and nipples: Secondary | ICD-10-CM

## 2020-12-10 DIAGNOSIS — C50411 Malignant neoplasm of upper-outer quadrant of right female breast: Secondary | ICD-10-CM

## 2020-12-10 NOTE — Progress Notes (Signed)
Patient is a 40 year old female here for follow-up on her bilateral breast reconstruction with Dr. Marla Roe.  She had her expanders placed on 09/14/2020.  She currently has 525 cc in each breast expander.  Patient reports she is doing well, she would like an additional fill today.  Chaperone present on exam On exam bilateral mastectomy incisions are intact, bilateral mastectomy flaps are viable with good color and capillary refill. No erythema noted.  We placed injectable saline in the Expander using a sterile technique: Right: 50 cc for a total of 575/535 cc Left: 50 cc for a total of 575 / 535 cc  Patient is doing well, she is scheduled for exchange of bilateral tissue expanders for silicone bilateral breast implants on 01/06/2021 with Dr. Marla Roe. She is comfortable with moving forward with exchange, she would like 1 additional fill if possible. She is aware that depending on the laxity of her skin at the next follow-up this may or may not be possible. She has a follow-up in 2 weeks to discuss surgery and for an additional fill. We will send an implant order today. We discussed silicone versus saline implants previously and she is comfortable moving forward with silicone implants.

## 2020-12-22 ENCOUNTER — Encounter: Payer: Self-pay | Admitting: Surgical

## 2020-12-22 ENCOUNTER — Ambulatory Visit (INDEPENDENT_AMBULATORY_CARE_PROVIDER_SITE_OTHER): Payer: Medicaid Other | Admitting: Surgical

## 2020-12-22 ENCOUNTER — Other Ambulatory Visit: Payer: Self-pay

## 2020-12-22 VITALS — BP 124/85 | HR 73 | Ht 67.5 in | Wt 208.0 lb

## 2020-12-22 DIAGNOSIS — C50411 Malignant neoplasm of upper-outer quadrant of right female breast: Secondary | ICD-10-CM

## 2020-12-22 DIAGNOSIS — Z170 Estrogen receptor positive status [ER+]: Secondary | ICD-10-CM

## 2020-12-22 DIAGNOSIS — Z9013 Acquired absence of bilateral breasts and nipples: Secondary | ICD-10-CM

## 2020-12-22 MED ORDER — HYDROCODONE-ACETAMINOPHEN 5-325 MG PO TABS: 1.0000 | ORAL_TABLET | Freq: Four times a day (QID) | ORAL | 0 refills | Status: AC | PRN

## 2020-12-22 MED ORDER — DOXYCYCLINE HYCLATE 100 MG PO TABS: 100.0000 mg | ORAL_TABLET | Freq: Two times a day (BID) | ORAL | 0 refills | Status: AC
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
## 2020-12-30 ENCOUNTER — Other Ambulatory Visit: Payer: Self-pay

## 2020-12-30 ENCOUNTER — Encounter (HOSPITAL_BASED_OUTPATIENT_CLINIC_OR_DEPARTMENT_OTHER): Payer: Self-pay | Admitting: Plastic Surgery

## 2021-01-04 ENCOUNTER — Other Ambulatory Visit: Payer: Self-pay

## 2021-01-04 ENCOUNTER — Other Ambulatory Visit
Admission: RE | Admit: 2021-01-04 | Discharge: 2021-01-04 | Disposition: A | Discharge status: Home / Self Care | Payer: 59 | Source: Ambulatory Visit | Attending: Plastic Surgery | Admitting: Plastic Surgery

## 2021-01-04 DIAGNOSIS — Z01812 Encounter for preprocedural laboratory examination: Secondary | ICD-10-CM | POA: Insufficient documentation

## 2021-01-04 DIAGNOSIS — Z20822 Contact with and (suspected) exposure to covid-19: Secondary | ICD-10-CM | POA: Insufficient documentation

## 2021-01-04 LAB — SARS CORONAVIRUS 2 (TAT 6-24 HRS): SARS Coronavirus 2: NEGATIVE

## 2021-01-06 ENCOUNTER — Ambulatory Visit (HOSPITAL_BASED_OUTPATIENT_CLINIC_OR_DEPARTMENT_OTHER): Payer: 59 | Admitting: Anesthesiology

## 2021-01-06 ENCOUNTER — Encounter (HOSPITAL_BASED_OUTPATIENT_CLINIC_OR_DEPARTMENT_OTHER)
Admission: RE | Disposition: A | Discharge status: Home / Self Care | Payer: Self-pay | Source: Home / Self Care | Attending: Plastic Surgery

## 2021-01-06 ENCOUNTER — Other Ambulatory Visit: Payer: Self-pay

## 2021-01-06 ENCOUNTER — Ambulatory Visit (HOSPITAL_BASED_OUTPATIENT_CLINIC_OR_DEPARTMENT_OTHER)
Admission: RE | Admit: 2021-01-06 | Discharge: 2021-01-06 | Disposition: A | Discharge status: Home / Self Care | Payer: 59 | Attending: Plastic Surgery | Admitting: Plastic Surgery

## 2021-01-06 ENCOUNTER — Encounter (HOSPITAL_BASED_OUTPATIENT_CLINIC_OR_DEPARTMENT_OTHER): Payer: Self-pay | Admitting: Plastic Surgery

## 2021-01-06 DIAGNOSIS — Z888 Allergy status to other drugs, medicaments and biological substances status: Secondary | ICD-10-CM | POA: Insufficient documentation

## 2021-01-06 DIAGNOSIS — Z45812 Encounter for adjustment or removal of left breast implant: Secondary | ICD-10-CM | POA: Diagnosis present

## 2021-01-06 DIAGNOSIS — Z91040 Latex allergy status: Secondary | ICD-10-CM | POA: Insufficient documentation

## 2021-01-06 DIAGNOSIS — C50411 Malignant neoplasm of upper-outer quadrant of right female breast: Secondary | ICD-10-CM | POA: Insufficient documentation

## 2021-01-06 DIAGNOSIS — Z881 Allergy status to other antibiotic agents status: Secondary | ICD-10-CM | POA: Diagnosis not present

## 2021-01-06 DIAGNOSIS — Z9221 Personal history of antineoplastic chemotherapy: Secondary | ICD-10-CM | POA: Insufficient documentation

## 2021-01-06 DIAGNOSIS — Z880 Allergy status to penicillin: Secondary | ICD-10-CM | POA: Diagnosis not present

## 2021-01-06 DIAGNOSIS — Z79899 Other long term (current) drug therapy: Secondary | ICD-10-CM | POA: Insufficient documentation

## 2021-01-06 DIAGNOSIS — Z45811 Encounter for adjustment or removal of right breast implant: Secondary | ICD-10-CM | POA: Insufficient documentation

## 2021-01-06 DIAGNOSIS — Z9882 Breast implant status: Secondary | ICD-10-CM

## 2021-01-06 DIAGNOSIS — Z9013 Acquired absence of bilateral breasts and nipples: Secondary | ICD-10-CM | POA: Insufficient documentation

## 2021-01-06 DIAGNOSIS — Z170 Estrogen receptor positive status [ER+]: Secondary | ICD-10-CM | POA: Insufficient documentation

## 2021-01-06 DIAGNOSIS — F17290 Nicotine dependence, other tobacco product, uncomplicated: Secondary | ICD-10-CM | POA: Diagnosis not present

## 2021-01-06 DIAGNOSIS — Z79811 Long term (current) use of aromatase inhibitors: Secondary | ICD-10-CM | POA: Diagnosis not present

## 2021-01-06 DIAGNOSIS — Z91030 Bee allergy status: Secondary | ICD-10-CM | POA: Diagnosis not present

## 2021-01-06 DIAGNOSIS — Z91013 Allergy to seafood: Secondary | ICD-10-CM | POA: Insufficient documentation

## 2021-01-06 DIAGNOSIS — Z9010 Acquired absence of unspecified breast and nipple: Secondary | ICD-10-CM

## 2021-01-06 DIAGNOSIS — Z853 Personal history of malignant neoplasm of breast: Secondary | ICD-10-CM | POA: Diagnosis not present

## 2021-01-06 DIAGNOSIS — Z45819 Encounter for adjustment or removal of unspecified breast implant: Secondary | ICD-10-CM

## 2021-01-06 HISTORY — PX: REMOVAL OF BILATERAL TISSUE EXPANDERS WITH PLACEMENT OF BILATERAL BREAST IMPLANTS: SHX6431

## 2021-01-06 SURGERY — REMOVAL, TISSUE EXPANDER, BREAST, BILATERAL, WITH BILATERAL IMPLANT IMPLANT INSERTION
Anesthesia: General | Site: Breast | Laterality: Bilateral

## 2021-01-06 MED ORDER — FENTANYL CITRATE (PF) 100 MCG/2ML IJ SOLN
INTRAMUSCULAR | Status: AC
  Filled 2021-01-06: qty 2

## 2021-01-06 MED ORDER — ACETAMINOPHEN 325 MG RE SUPP: 650.0000 mg | RECTAL | Status: DC | PRN

## 2021-01-06 MED ORDER — SODIUM CHLORIDE 0.9% FLUSH: 3.0000 mL | Freq: Two times a day (BID) | INTRAVENOUS | Status: DC

## 2021-01-06 MED ORDER — PROPOFOL 10 MG/ML IV BOLUS
INTRAVENOUS | Status: DC | PRN
  Administered 2021-01-06: 200 mg via INTRAVENOUS

## 2021-01-06 MED ORDER — LIDOCAINE 2% (20 MG/ML) 5 ML SYRINGE
INTRAMUSCULAR | Status: AC
  Filled 2021-01-06: qty 5

## 2021-01-06 MED ORDER — GENTAMICIN SULFATE 40 MG/ML IJ SOLN
5.0000 mg/kg | INTRAVENOUS | Status: DC
  Filled 2021-01-06: qty 11.75

## 2021-01-06 MED ORDER — PHENYLEPHRINE 40 MCG/ML (10ML) SYRINGE FOR IV PUSH (FOR BLOOD PRESSURE SUPPORT)
PREFILLED_SYRINGE | INTRAVENOUS | Status: AC
  Filled 2021-01-06: qty 10

## 2021-01-06 MED ORDER — SODIUM CHLORIDE 0.9% FLUSH
3.0000 mL | INTRAVENOUS | Status: DC | PRN
Start: 2021-01-06 — End: 2021-01-06

## 2021-01-06 MED ORDER — MIDAZOLAM HCL 5 MG/5ML IJ SOLN
INTRAMUSCULAR | Status: DC | PRN
  Administered 2021-01-06: 2 mg via INTRAVENOUS

## 2021-01-06 MED ORDER — SODIUM CHLORIDE 0.9 % IV SOLN: 250.0000 mL | INTRAVENOUS | Status: DC | PRN

## 2021-01-06 MED ORDER — MIDAZOLAM HCL 2 MG/2ML IJ SOLN
INTRAMUSCULAR | Status: AC
  Filled 2021-01-06: qty 2

## 2021-01-06 MED ORDER — CHLORHEXIDINE GLUCONATE CLOTH 2 % EX PADS: 6.0000 | MEDICATED_PAD | Freq: Once | CUTANEOUS | Status: DC

## 2021-01-06 MED ORDER — MEPERIDINE HCL 25 MG/ML IJ SOLN
6.2500 mg | INTRAMUSCULAR | Status: DC | PRN
  Administered 2021-01-06: 12.5 mg via INTRAVENOUS

## 2021-01-06 MED ORDER — ONDANSETRON HCL 4 MG/2ML IJ SOLN
INTRAMUSCULAR | Status: AC
  Filled 2021-01-06: qty 2

## 2021-01-06 MED ORDER — AMISULPRIDE (ANTIEMETIC) 5 MG/2ML IV SOLN: 10.0000 mg | Freq: Once | INTRAVENOUS | Status: DC | PRN

## 2021-01-06 MED ORDER — LACTATED RINGERS IV SOLN: INTRAVENOUS | Status: DC

## 2021-01-06 MED ORDER — LIDOCAINE HCL (CARDIAC) PF 100 MG/5ML IV SOSY
PREFILLED_SYRINGE | INTRAVENOUS | Status: DC | PRN
  Administered 2021-01-06: 80 mg via INTRAVENOUS

## 2021-01-06 MED ORDER — LIDOCAINE-EPINEPHRINE 1 %-1:100000 IJ SOLN
INTRAMUSCULAR | Status: DC | PRN
Start: 2021-01-06 — End: 2021-01-06
  Administered 2021-01-06: 20 mL

## 2021-01-06 MED ORDER — SUCCINYLCHOLINE CHLORIDE 200 MG/10ML IV SOSY
PREFILLED_SYRINGE | INTRAVENOUS | Status: AC
  Filled 2021-01-06: qty 10

## 2021-01-06 MED ORDER — EPHEDRINE 5 MG/ML INJ
INTRAVENOUS | Status: AC
  Filled 2021-01-06: qty 10

## 2021-01-06 MED ORDER — FENTANYL CITRATE (PF) 100 MCG/2ML IJ SOLN
INTRAMUSCULAR | Status: DC | PRN
  Administered 2021-01-06 (×4): 50 ug via INTRAVENOUS

## 2021-01-06 MED ORDER — LIDOCAINE-EPINEPHRINE 1 %-1:100000 IJ SOLN
INTRAMUSCULAR | Status: AC
  Filled 2021-01-06: qty 1

## 2021-01-06 MED ORDER — MEPERIDINE HCL 25 MG/ML IJ SOLN
INTRAMUSCULAR | Status: AC
  Filled 2021-01-06: qty 1

## 2021-01-06 MED ORDER — EPHEDRINE SULFATE 50 MG/ML IJ SOLN
INTRAMUSCULAR | Status: DC | PRN
  Administered 2021-01-06: 10 mg via INTRAVENOUS

## 2021-01-06 MED ORDER — OXYCODONE HCL 5 MG PO TABS: 5.0000 mg | ORAL_TABLET | Freq: Once | ORAL | Status: DC | PRN

## 2021-01-06 MED ORDER — OXYCODONE HCL 5 MG PO TABS: 5.0000 mg | ORAL_TABLET | ORAL | Status: DC | PRN

## 2021-01-06 MED ORDER — ONDANSETRON HCL 4 MG/2ML IJ SOLN
INTRAMUSCULAR | Status: DC | PRN
  Administered 2021-01-06: 4 mg via INTRAVENOUS

## 2021-01-06 MED ORDER — CLINDAMYCIN PHOSPHATE 900 MG/50ML IV SOLN
INTRAVENOUS | Status: AC
  Filled 2021-01-06: qty 50

## 2021-01-06 MED ORDER — CLINDAMYCIN PHOSPHATE 900 MG/50ML IV SOLN: 900.0000 mg | INTRAVENOUS | Status: DC

## 2021-01-06 MED ORDER — HYDROMORPHONE HCL 1 MG/ML IJ SOLN: 0.2500 mg | INTRAMUSCULAR | Status: DC | PRN

## 2021-01-06 MED ORDER — DEXAMETHASONE SODIUM PHOSPHATE 4 MG/ML IJ SOLN
INTRAMUSCULAR | Status: DC | PRN
  Administered 2021-01-06: 10 mg via INTRAVENOUS

## 2021-01-06 MED ORDER — OXYCODONE HCL 5 MG/5ML PO SOLN: 5.0000 mg | Freq: Once | ORAL | Status: DC | PRN

## 2021-01-06 MED ORDER — DROPERIDOL 2.5 MG/ML IJ SOLN
INTRAMUSCULAR | Status: DC | PRN
  Administered 2021-01-06: .625 mg via INTRAVENOUS

## 2021-01-06 MED ORDER — DEXAMETHASONE SODIUM PHOSPHATE 10 MG/ML IJ SOLN
INTRAMUSCULAR | Status: AC
  Filled 2021-01-06: qty 1

## 2021-01-06 MED ORDER — PROMETHAZINE HCL 25 MG/ML IJ SOLN: 6.2500 mg | INTRAMUSCULAR | Status: DC | PRN

## 2021-01-06 MED ORDER — ACETAMINOPHEN 325 MG PO TABS: 650.0000 mg | ORAL_TABLET | ORAL | Status: DC | PRN

## 2021-01-06 MED ORDER — FENTANYL CITRATE (PF) 100 MCG/2ML IJ SOLN: 25.0000 ug | INTRAMUSCULAR | Status: DC | PRN

## 2021-01-06 SURGICAL SUPPLY — 69 items
ADH SKN CLS APL DERMABOND .7 (GAUZE/BANDAGES/DRESSINGS)
BAG DECANTER FOR FLEXI CONT (MISCELLANEOUS) ×2 IMPLANT
BINDER BREAST LRG (GAUZE/BANDAGES/DRESSINGS) IMPLANT
BINDER BREAST MEDIUM (GAUZE/BANDAGES/DRESSINGS) IMPLANT
BINDER BREAST XLRG (GAUZE/BANDAGES/DRESSINGS) ×2 IMPLANT
BINDER BREAST XXLRG (GAUZE/BANDAGES/DRESSINGS) IMPLANT
BIOPATCH RED 1 DISK 7.0 (GAUZE/BANDAGES/DRESSINGS) IMPLANT
BLADE HEX COATED 2.75 (ELECTRODE) ×2 IMPLANT
BLADE SURG 15 STRL LF DISP TIS (BLADE) ×2 IMPLANT
BLADE SURG 15 STRL SS (BLADE) ×4
CANISTER SUCT 1200ML W/VALVE (MISCELLANEOUS) ×2 IMPLANT
COVER BACK TABLE 60X90IN (DRAPES) ×2 IMPLANT
COVER MAYO STAND STRL (DRAPES) ×2 IMPLANT
COVER WAND RF STERILE (DRAPES) IMPLANT
DECANTER SPIKE VIAL GLASS SM (MISCELLANEOUS) IMPLANT
DERMABOND ADVANCED (GAUZE/BANDAGES/DRESSINGS)
DERMABOND ADVANCED .7 DNX12 (GAUZE/BANDAGES/DRESSINGS) IMPLANT
DRAIN CHANNEL 19F RND (DRAIN) IMPLANT
DRAPE LAPAROSCOPIC ABDOMINAL (DRAPES) ×2 IMPLANT
DRSG OPSITE POSTOP 4X6 (GAUZE/BANDAGES/DRESSINGS) ×4 IMPLANT
DRSG PAD ABDOMINAL 8X10 ST (GAUZE/BANDAGES/DRESSINGS) ×4 IMPLANT
ELECT BLADE 4.0 EZ CLEAN MEGAD (MISCELLANEOUS) ×2
ELECT REM PT RETURN 9FT ADLT (ELECTROSURGICAL) ×2
ELECTRODE BLDE 4.0 EZ CLN MEGD (MISCELLANEOUS) ×1 IMPLANT
ELECTRODE REM PT RTRN 9FT ADLT (ELECTROSURGICAL) ×1 IMPLANT
EVACUATOR SILICONE 100CC (DRAIN) IMPLANT
FUNNEL KELLER 2 DISP (MISCELLANEOUS) ×2 IMPLANT
GAUZE SPONGE 4X4 12PLY STRL LF (GAUZE/BANDAGES/DRESSINGS) IMPLANT
GLOVE SURG POLYISO LF SZ6.5 (GLOVE) ×8 IMPLANT
GLOVE SURG POLYISO LF SZ8 (GLOVE) ×4 IMPLANT
GLOVE SURG SYN 7.5  E (GLOVE) ×6
GLOVE SURG SYN 7.5 E (GLOVE) ×3 IMPLANT
GOWN STRL REUS W/ TWL LRG LVL3 (GOWN DISPOSABLE) ×2 IMPLANT
GOWN STRL REUS W/TWL LRG LVL3 (GOWN DISPOSABLE) ×4
IMPL BREAST SILICONE 750CC (Breast) ×2 IMPLANT
IMPLANT BREAST SILICONE 750CC (Breast) ×4 IMPLANT
IV NS 1000ML (IV SOLUTION)
IV NS 1000ML BAXH (IV SOLUTION) IMPLANT
IV NS 500ML (IV SOLUTION)
IV NS 500ML BAXH (IV SOLUTION) IMPLANT
KIT FILL SYSTEM UNIVERSAL (SET/KITS/TRAYS/PACK) IMPLANT
NDL SAFETY ECLIPSE 18X1.5 (NEEDLE) ×1 IMPLANT
NEEDLE HYPO 18GX1.5 SHARP (NEEDLE) ×2
NEEDLE HYPO 25X1 1.5 SAFETY (NEEDLE) ×2 IMPLANT
PACK BASIN DAY SURGERY FS (CUSTOM PROCEDURE TRAY) ×2 IMPLANT
PENCIL SMOKE EVACUATOR (MISCELLANEOUS) ×2 IMPLANT
PIN SAFETY STERILE (MISCELLANEOUS) IMPLANT
SIZER BREAST GEL HP 700CC (SIZER) ×2
SIZER BREAST REUSE 750CC (SIZER) ×2
SIZER BRST GEL HP 700CC (SIZER) ×1 IMPLANT
SIZER BRST REUSE P6.6 750CC (SIZER) ×1 IMPLANT
SLEEVE SCD COMPRESS KNEE MED (STOCKING) ×2 IMPLANT
SPONGE LAP 18X18 RF (DISPOSABLE) ×4 IMPLANT
STRIP SUTURE WOUND CLOSURE 1/2 (MISCELLANEOUS) IMPLANT
SUT MNCRL AB 4-0 PS2 18 (SUTURE) ×4 IMPLANT
SUT MON AB 3-0 SH 27 (SUTURE) ×8
SUT MON AB 3-0 SH27 (SUTURE) ×4 IMPLANT
SUT MON AB 5-0 PS2 18 (SUTURE) ×4 IMPLANT
SUT PDS AB 2-0 CT2 27 (SUTURE) IMPLANT
SUT VIC AB 3-0 SH 27 (SUTURE)
SUT VIC AB 3-0 SH 27X BRD (SUTURE) IMPLANT
SUT VICRYL 4-0 PS2 18IN ABS (SUTURE) IMPLANT
SYR BULB IRRIG 60ML STRL (SYRINGE) ×2 IMPLANT
SYR CONTROL 10ML LL (SYRINGE) ×2 IMPLANT
TOWEL GREEN STERILE FF (TOWEL DISPOSABLE) ×4 IMPLANT
TRAY DSU PREP LF (CUSTOM PROCEDURE TRAY) ×2 IMPLANT
TUBE CONNECTING 20X1/4 (TUBING) ×2 IMPLANT
UNDERPAD 30X36 HEAVY ABSORB (UNDERPADS AND DIAPERS) ×4 IMPLANT
YANKAUER SUCT BULB TIP NO VENT (SUCTIONS) ×2 IMPLANT

## 2021-01-06 NOTE — Op Note (Signed)
Op report Bilateral Exchange   DATE OF OPERATION: 01/06/2021  LOCATION: [place]  SURGICAL DIVISION: Plastic Surgery  PREOPERATIVE DIAGNOSIS:  1.History of breast cancer.  2. Acquired absence of bilateral breast.   POSTOPERATIVE DIAGNOSIS:  1. History of breast cancer.  2. Acquired absence of bilateral breast.   PROCEDURE:  1. Bilateral exchange of tissue expanders for implants.  2. Bilateral capsulotomies for implant respositioning.  SURGEON: Claire Sanger Dillingham, DO  ASSISTANT: Roetta Sessions, PA  ANESTHESIA:  General.   COMPLICATIONS: None.   IMPLANTS: Left - Mentor Smooth Round Ultra High Profile Gel 750cc. Ref #[id number].  Serial Number [id number] Right - Mentor Smooth Round Ultra High Profile Gel 750cc. Ref #[id number].  Serial Number [id number]  INDICATIONS FOR PROCEDURE:  The patient, [name], is a 40 y.o. female born on 1980/11/22, is here for treatment after bilateral mastectomies.  She had tissue expanders placed at the time of mastectomies. She now presents for exchange of her expanders for implants.  She requires capsulotomies to better position the implants. MRN: [id number]  CONSENT:  Informed consent was obtained directly from the patient. Risks, benefits and alternatives were fully discussed. Specific risks including but not limited to bleeding, infection, hematoma, seroma, scarring, pain, implant infection, implant extrusion, capsular contracture, asymmetry, wound healing problems, and need for further surgery were all discussed. The patient did have an ample opportunity to have her questions answered to her satisfaction.   DESCRIPTION OF PROCEDURE:  The patient was taken to the operating room. SCDs were placed and IV antibiotics were given. The patient's chest was prepped and draped in a sterile fashion. A time out was performed and the implants to be used were identified.    On the right breast:  Lidocaine with  epinephrine was used to infiltrate at the incision site. The old mastectomy scar was incised.  The mastectomy flaps from the superior and inferior flaps were raised over the pectoralis major muscle for several centimeters to minimize tension for the closure. The pectoralis was split inferior to the skin incision to expose and remove the tissue expander.  Inspection of the pocket showed a normal healthy capsule and good integration of the biologic matrix.  The pocket was irrigated with antibiotic solution.  Circumferential capsulotomies were performed to allow for breast pocket expansion.  Measurements were made and a sizer used to confirm adequate pocket size for the implant dimensions.  Hemostasis was ensured with electrocautery. New gloves were placed. The implant was soaked in antibiotic solution and then placed in the pocket and oriented appropriately. The pectoralis major muscle and capsule on the anterior surface were re-closed with a 3-0 PDS and Monocryl suture. The remaining skin was closed with 4-0 Monocryl deep dermal and 5-0 Monocryl subcuticular stitches.   On the left breast: The old mastectomy scar was incised.  The mastectomy flaps from the superior and inferior flaps were raised over the pectoralis major muscle for several centimeters to minimize tension for the closure. The pectoralis was split inferior to the skin incision to expose and remove the tissue expander.  Inspection of the pocket showed a normal healthy capsule and good integration of the biologic matrix. The left capsule was tighter and more scoring was done for improved release.  Circumferential capsulotomies were performed to allow for breast pocket expansion.  Measurements were made and a sizer utilized to confirm adequate pocket size for the implant dimensions.  Hemostasis was ensured with the electrocautery.  New gloves were applied. The implant  was soaked in antibiotic solution and placed in the pocket and oriented  appropriately. The pectoralis major muscle and capsule on the anterior surface were re-closed with a 3-0 PDS and Monocryl suture. The remaining skin was closed with 4-0 Monocryl deep dermal and 5-0 Monocryl subcuticular stitches.  Dermabond was applied to the incision site. A breast binder and ABDs were placed.  The patient was allowed to wake from anesthesia and taken to the recovery room in satisfactory condition.   The advanced practice practitioner (APP) assisted throughout the case.  The APP was essential in retraction and counter traction when needed to make the case progress smoothly.  This retraction and assistance made it possible to see the tissue plans for the procedure.  The assistance was needed for blood control, tissue re-approximation and assisted with closure of the incision site.

## 2021-01-06 NOTE — Anesthesia Procedure Notes (Signed)
Procedure Name: LMA Insertion Date/Time: 01/06/2021 9:44 AM Performed by: Willa Frater, CRNA Pre-anesthesia Checklist: Patient identified, Emergency Drugs available, Suction available and Patient being monitored Patient Re-evaluated:Patient Re-evaluated prior to induction Oxygen Delivery Method: Circle system utilized Preoxygenation: Pre-oxygenation with 100% oxygen Induction Type: IV induction Ventilation: Mask ventilation without difficulty LMA: LMA inserted LMA Size: 4.0 Number of attempts: 1 Airway Equipment and Method: Bite block Placement Confirmation: positive ETCO2 Tube secured with: Tape Dental Injury: Teeth and Oropharynx as per pre-operative assessment
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
## 2021-01-06 NOTE — Transfer of Care (Signed)
Immediate Anesthesia Transfer of Care Note  Patient: [name]  Procedure(s) Performed: REMOVAL OF BILATERAL TISSUE EXPANDERS WITH PLACEMENT OF BILATERAL BREAST IMPLANTS (Bilateral Breast)  Patient Location: PACU  Anesthesia Type:General  Level of Consciousness: awake, alert , oriented and patient cooperative  Airway & Oxygen Therapy: Patient Spontanous Breathing and Patient connected to face mask oxygen  Post-op Assessment: Report given to RN and Post -op Vital signs reviewed and stable  Post vital signs: Reviewed and stable  Last Vitals:  Vitals Value Taken Time  BP    Temp    Pulse 91 01/06/21 1130  Resp 8 01/06/21 1130  SpO2 97 % 01/06/21 1130  Vitals shown include unvalidated device data.  Last Pain:  Vitals:   01/06/21 0730  TempSrc: Oral  PainSc: 0-No pain         Complications: No complications documented.

## 2021-01-06 NOTE — Discharge Instructions (Signed)

## 2021-01-06 NOTE — Interval H&P Note (Signed)
History and Physical Interval Note:  01/06/2021 9:13 AM  [name]  has presented today for surgery, with the diagnosis of history of breast cancer.  The various methods of treatment have been discussed with the patient and family. After consideration of risks, benefits and other options for treatment, the patient has consented to  Procedure(s) with comments: REMOVAL OF BILATERAL TISSUE EXPANDERS WITH PLACEMENT OF BILATERAL BREAST IMPLANTS (Bilateral) - 2 hours, please as a surgical intervention.  The patient's history has been reviewed, patient examined, no change in status, stable for surgery.  I have reviewed the patient's chart and labs.  Questions were answered to the patient's satisfaction.     Loel Lofty Rae Plotner

## 2021-01-06 NOTE — Anesthesia Postprocedure Evaluation (Signed)
Anesthesia Post Note  Patient: [name]  Procedure(s) Performed: REMOVAL OF BILATERAL TISSUE EXPANDERS WITH PLACEMENT OF BILATERAL BREAST IMPLANTS (Bilateral Breast)     Patient location during evaluation: PACU Anesthesia Type: General Level of consciousness: awake and alert Pain management: pain level controlled Vital Signs Assessment: post-procedure vital signs reviewed and stable Respiratory status: spontaneous breathing, nonlabored ventilation and respiratory function stable Cardiovascular status: blood pressure returned to baseline and stable Postop Assessment: no apparent nausea or vomiting Anesthetic complications: no   No complications documented.  Last Vitals:  Vitals:   01/06/21 1215 01/06/21 1240  BP: 127/81   Pulse: 75 72  Resp: 20 18  Temp:  37.1 C  SpO2: 95% 97%    Last Pain:  Vitals:   01/06/21 1240  TempSrc:   PainSc: [name]

## 2021-01-07 ENCOUNTER — Encounter (HOSPITAL_BASED_OUTPATIENT_CLINIC_OR_DEPARTMENT_OTHER): Payer: Self-pay | Admitting: Plastic Surgery

## 2021-01-07 NOTE — Addendum Note (Signed)
Addendum  created 01/07/21 1153 by Tawni Millers, CRNA   Charge Capture section accepted

## 2021-01-08 ENCOUNTER — Encounter: Payer: Self-pay | Admitting: Plastic Surgery

## 2021-01-13 ENCOUNTER — Encounter: Payer: Self-pay | Admitting: Plastic Surgery

## 2021-01-13 ENCOUNTER — Telehealth: Payer: Self-pay

## 2021-01-13 NOTE — Telephone Encounter (Signed)
Patient called to state she has returned to work but is still in a lot of pain. She would like a note to stay out of work until her next visit with is on 01/15/2021.

## 2021-01-14 ENCOUNTER — Other Ambulatory Visit: Payer: Self-pay | Admitting: *Deleted

## 2021-01-14 MED ORDER — ALPRAZOLAM 0.5 MG PO TABS: 0.5000 mg | ORAL_TABLET | Freq: Three times a day (TID) | ORAL | 0 refills | Status: DC | PRN

## 2021-01-15 ENCOUNTER — Encounter: Payer: Self-pay | Admitting: Nurse Practitioner

## 2021-01-15 ENCOUNTER — Encounter: Payer: Self-pay | Admitting: Plastic Surgery

## 2021-01-15 ENCOUNTER — Other Ambulatory Visit: Payer: Self-pay | Admitting: Nurse Practitioner

## 2021-01-15 ENCOUNTER — Other Ambulatory Visit: Payer: Self-pay

## 2021-01-15 ENCOUNTER — Ambulatory Visit (INDEPENDENT_AMBULATORY_CARE_PROVIDER_SITE_OTHER): Payer: 59 | Admitting: Plastic Surgery

## 2021-01-15 VITALS — BP 121/88 | HR 84

## 2021-01-15 DIAGNOSIS — Z9013 Acquired absence of bilateral breasts and nipples: Secondary | ICD-10-CM

## 2021-01-15 MED ORDER — SERTRALINE HCL 100 MG PO TABS: 150.0000 mg | ORAL_TABLET | Freq: Every day | ORAL | 3 refills | Status: DC

## 2021-01-15 NOTE — Progress Notes (Signed)
   Subjective:    Patient ID: [name], female    DOB: 10/06/1981, 40 y.o.   MRN: [id number]  The patient is a 40 year old female here for follow-up after undergoing removal of her expanders and placement of implants.  Below is her current size.  Her incisions are healing well.  She has a little bit of swelling and bruising but overall she is doing really well.  She does have some tenderness in the left axilla.  This is likely where the capsule was released.  I do not see any bruising there.  Left - Mentor Smooth Round Ultra High Profile Gel 750cc. Ref #[id number].  Serial Number [id number] Right - Mentor Smooth Round Ultra High Profile Gel 750cc. Ref #[id number].  Serial Number [id number]   Review of Systems  Constitutional: Positive for activity change. Negative for appetite change.  Eyes: Negative.   Respiratory: Negative.   Cardiovascular: Negative.   Gastrointestinal: Negative.   Genitourinary: Negative.        Objective:   Physical Exam Vitals reviewed.  Constitutional:      Appearance: Normal appearance.  Cardiovascular:     Rate and Rhythm: Normal rate.  Pulmonary:     Effort: Pulmonary effort is normal. No respiratory distress.  Chest:    Skin:    General: Skin is warm.     Coloration: Skin is not jaundiced.     Findings: Bruising present. No lesion.  Neurological:     Mental Status: She is alert. Mental status is at baseline.  Psychiatric:        Mood and Affect: Mood normal.        Behavior: Behavior normal.        Assessment & Plan:     ICD-10-CM   1. Absence of breast, acquired, bilateral  Z90.13      I would like her to try some Voltaren cream on the left axilla area.  Also some Motrin.  If it is not improving by her next visit then we may have to do some physical therapy.  Pictures were obtained of the patient and placed in the chart with the patient's or guardian's permission.

## 2021-01-26 ENCOUNTER — Other Ambulatory Visit: Payer: Self-pay | Admitting: *Deleted

## 2021-01-26 MED ORDER — TRAZODONE HCL 50 MG PO TABS: 50.0000 mg | ORAL_TABLET | Freq: Every evening | ORAL | 2 refills | Status: DC | PRN

## 2021-02-01 NOTE — Progress Notes (Signed)
Patient is a 40 year old female here for follow-up after exchange of her bilateral tissue expanders for bilateral breast implants with Dr. Marla Roe on 01/06/2021.  She is nearly 4 weeks postop.  She had 750 cc Mentor smooth round ultrahigh profile gel implants placed bilaterally.  She reports that overall she is doing well, reports some occasional muscle spasms and tenderness in her axilla.  She reports that she is overall pleased with how things have healed but does have some questions about some excess tissue along the middle portion of the left breast.  Chaperone present on exam Bilateral breast incisions are well-healed, bilateral breasts are symmetric.  She has a little bit of volume loss along the superior pole of the left breast. She has a little bit of excess tissue along the medial portion of the left breast, it appears mild. No erythema noted.  No wounds noted.  No subcutaneous fluid collections noted.  No cellulitic changes are noted.  Recommend 2 more weeks of 24/7 compression, avoid strenuous activity. She can resume normal activity in 2 weeks and begin increased lifting as able. Continue to wear sports bra for total of 3 months postop.  She can transition to a normal bra at that point.  Will send referral to [facility] for [place] tattooing.  Recommend following up in 3 months for reevaluation.  Call with questions or concerns.  Pictures were taken and placed in the patient's chart with her permission.

## 2021-02-02 ENCOUNTER — Ambulatory Visit (INDEPENDENT_AMBULATORY_CARE_PROVIDER_SITE_OTHER): Payer: 59 | Admitting: Surgical

## 2021-02-02 ENCOUNTER — Encounter: Payer: Self-pay | Admitting: Surgical

## 2021-02-02 ENCOUNTER — Other Ambulatory Visit: Payer: Self-pay

## 2021-02-02 VITALS — BP 123/82 | HR 86

## 2021-02-02 DIAGNOSIS — Z9013 Acquired absence of bilateral breasts and nipples: Secondary | ICD-10-CM

## 2021-02-02 DIAGNOSIS — Z170 Estrogen receptor positive status [ER+]: Secondary | ICD-10-CM

## 2021-02-02 DIAGNOSIS — C50411 Malignant neoplasm of upper-outer quadrant of right female breast: Secondary | ICD-10-CM

## 2021-02-07 ENCOUNTER — Other Ambulatory Visit: Payer: Self-pay | Admitting: Nurse Practitioner

## 2021-02-09 ENCOUNTER — Other Ambulatory Visit: Payer: Self-pay | Admitting: Oncology

## 2021-02-18 ENCOUNTER — Ambulatory Visit
Admission: RE | Admit: 2021-02-18 | Discharge: 2021-02-18 | Disposition: A | Discharge status: Home / Self Care | Payer: Medicaid Other | Source: Ambulatory Visit | Attending: Oncology | Admitting: Oncology

## 2021-02-18 ENCOUNTER — Other Ambulatory Visit: Payer: Self-pay

## 2021-02-18 DIAGNOSIS — Z79899 Other long term (current) drug therapy: Secondary | ICD-10-CM | POA: Insufficient documentation

## 2021-02-22 ENCOUNTER — Other Ambulatory Visit: Payer: Self-pay | Admitting: *Deleted

## 2021-02-22 MED ORDER — TRAZODONE HCL 50 MG PO TABS: 50.0000 mg | ORAL_TABLET | Freq: Every evening | ORAL | 0 refills | Status: DC | PRN

## 2021-02-24 ENCOUNTER — Telehealth: Payer: Medicaid Other | Admitting: Physician Assistant

## 2021-02-24 ENCOUNTER — Other Ambulatory Visit: Payer: Medicaid Other

## 2021-02-24 ENCOUNTER — Ambulatory Visit: Payer: Medicaid Other

## 2021-02-24 DIAGNOSIS — J069 Acute upper respiratory infection, unspecified: Secondary | ICD-10-CM

## 2021-02-24 MED ORDER — BENZONATATE 100 MG PO CAPS: 100.0000 mg | ORAL_CAPSULE | Freq: Three times a day (TID) | ORAL | 0 refills | Status: DC | PRN

## 2021-02-24 NOTE — Progress Notes (Signed)

## 2021-02-24 NOTE — Progress Notes (Signed)
I have spent 5 minutes in review of e-visit questionnaire, review and updating patient chart, medical decision making and response to patient.   Modean Mccullum Cody Zoraya Fiorenza, PA-C    

## 2021-02-25 ENCOUNTER — Inpatient Hospital Stay: Payer: Commercial Managed Care - PPO

## 2021-02-25 ENCOUNTER — Other Ambulatory Visit: Payer: Self-pay

## 2021-02-25 ENCOUNTER — Inpatient Hospital Stay (HOSPITAL_BASED_OUTPATIENT_CLINIC_OR_DEPARTMENT_OTHER): Payer: Commercial Managed Care - PPO | Admitting: Oncology

## 2021-02-25 ENCOUNTER — Inpatient Hospital Stay: Payer: Commercial Managed Care - PPO | Attending: Oncology

## 2021-02-25 VITALS — BP 116/91 | HR 123 | Temp 97.5°F | Resp 16 | Wt 199.0 lb

## 2021-02-25 DIAGNOSIS — C50411 Malignant neoplasm of upper-outer quadrant of right female breast: Secondary | ICD-10-CM

## 2021-02-25 DIAGNOSIS — Z5111 Encounter for antineoplastic chemotherapy: Secondary | ICD-10-CM | POA: Diagnosis not present

## 2021-02-25 DIAGNOSIS — Z170 Estrogen receptor positive status [ER+]: Secondary | ICD-10-CM | POA: Insufficient documentation

## 2021-02-25 DIAGNOSIS — Z5181 Encounter for therapeutic drug level monitoring: Secondary | ICD-10-CM

## 2021-02-25 DIAGNOSIS — Z79818 Long term (current) use of other agents affecting estrogen receptors and estrogen levels: Secondary | ICD-10-CM | POA: Diagnosis not present

## 2021-02-25 DIAGNOSIS — Z79811 Long term (current) use of aromatase inhibitors: Secondary | ICD-10-CM | POA: Insufficient documentation

## 2021-02-25 LAB — CBC WITH DIFFERENTIAL/PLATELET
Abs Immature Granulocytes: 0.02 10*3/uL (ref 0.00–0.07)
Basophils Absolute: 0 10*3/uL (ref 0.0–0.1)
Basophils Relative: 0 %
Eosinophils Absolute: 0.3 10*3/uL (ref 0.0–0.5)
Eosinophils Relative: 5 %
HCT: 41.3 % (ref 36.0–46.0)
Hemoglobin: 14.1 g/dL (ref 12.0–15.0)
Immature Granulocytes: 0 %
Lymphocytes Relative: 39 %
Lymphs Abs: 2.1 10*3/uL (ref 0.7–4.0)
MCH: 27.5 pg (ref 26.0–34.0)
MCHC: 34.1 g/dL (ref 30.0–36.0)
MCV: 80.5 fL (ref 80.0–100.0)
Monocytes Absolute: 0.4 10*3/uL (ref 0.1–1.0)
Monocytes Relative: 7 %
Neutro Abs: 2.6 10*3/uL (ref 1.7–7.7)
Neutrophils Relative %: 49 %
Platelets: 268 10*3/uL (ref 150–400)
RBC: 5.13 MIL/uL — ABNORMAL HIGH (ref 3.87–5.11)
RDW: 15.5 % (ref 11.5–15.5)
WBC: 5.4 10*3/uL (ref 4.0–10.5)
nRBC: 0 % (ref 0.0–0.2)

## 2021-02-25 LAB — COMPREHENSIVE METABOLIC PANEL
ALT: 29 U/L (ref 0–44)
AST: 39 U/L (ref 15–41)
Albumin: 4.8 g/dL (ref 3.5–5.0)
Alkaline Phosphatase: 91 U/L (ref 38–126)
Anion gap: 14 (ref 5–15)
BUN: 11 mg/dL (ref 6–20)
CO2: 21 mmol/L — ABNORMAL LOW (ref 22–32)
Calcium: 10 mg/dL (ref 8.9–10.3)
Chloride: 103 mmol/L (ref 98–111)
Creatinine, Ser: 0.81 mg/dL (ref 0.44–1.00)
GFR, Estimated: >60 mL/min (ref >60)
Glucose, Bld: 117 mg/dL — ABNORMAL HIGH (ref 70–99)
Potassium: 3.8 mmol/L (ref 3.5–5.1)
Sodium: 138 mmol/L (ref 135–145)
Total Bilirubin: 0.7 mg/dL (ref 0.3–1.2)
Total Protein: 8 g/dL (ref 6.5–8.1)

## 2021-02-25 MED ORDER — LEUPROLIDE ACETATE (3 MONTH) 11.25 MG IM KIT
11.2500 mg | PACK | INTRAMUSCULAR | Status: AC
  Administered 2021-02-25: 11.25 mg via INTRAMUSCULAR
  Filled 2021-02-25: qty 11.25

## 2021-02-25 NOTE — Progress Notes (Signed)
Pt in for follow up, reports was seen via E visit yesterday for chest and sinus congestion.  Reports has cough and nasal congestion is yellow in color. States Md yesterday prescribed tessalon perles.

## 2021-02-28 ENCOUNTER — Encounter: Payer: Self-pay | Admitting: Oncology
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
## 2021-03-01 ENCOUNTER — Other Ambulatory Visit: Payer: Self-pay

## 2021-03-01 ENCOUNTER — Encounter: Payer: Self-pay | Admitting: Oncology

## 2021-03-01 ENCOUNTER — Other Ambulatory Visit: Payer: Self-pay | Admitting: *Deleted

## 2021-03-01 MED ORDER — AZITHROMYCIN 250 MG PO TABS: ORAL_TABLET | ORAL | 0 refills | Status: DC

## 2021-03-01 NOTE — Telephone Encounter (Signed)
Ok to give prescription for z pak

## 2021-03-24 ENCOUNTER — Other Ambulatory Visit: Payer: Self-pay | Admitting: Internal Medicine

## 2021-03-26 ENCOUNTER — Inpatient Hospital Stay: Payer: Commercial Managed Care - PPO | Attending: Oncology

## 2021-03-26 ENCOUNTER — Other Ambulatory Visit: Payer: Self-pay

## 2021-03-26 ENCOUNTER — Encounter: Payer: Self-pay | Admitting: Oncology

## 2021-03-26 ENCOUNTER — Inpatient Hospital Stay: Payer: Commercial Managed Care - PPO

## 2021-03-26 ENCOUNTER — Other Ambulatory Visit: Payer: Self-pay | Admitting: *Deleted

## 2021-03-26 VITALS — BP 130/88 | HR 88 | Temp 97.2°F | Resp 18

## 2021-03-26 DIAGNOSIS — Z170 Estrogen receptor positive status [ER+]: Secondary | ICD-10-CM

## 2021-03-26 DIAGNOSIS — C50411 Malignant neoplasm of upper-outer quadrant of right female breast: Secondary | ICD-10-CM

## 2021-03-26 DIAGNOSIS — Z79811 Long term (current) use of aromatase inhibitors: Secondary | ICD-10-CM | POA: Diagnosis not present

## 2021-03-26 LAB — COMPREHENSIVE METABOLIC PANEL
ALT: 30 U/L (ref 0–44)
AST: 40 U/L (ref 15–41)
Albumin: 4.6 g/dL (ref 3.5–5.0)
Alkaline Phosphatase: 79 U/L (ref 38–126)
Anion gap: 12 (ref 5–15)
BUN: 12 mg/dL (ref 6–20)
CO2: 22 mmol/L (ref 22–32)
Calcium: 10 mg/dL (ref 8.9–10.3)
Chloride: 104 mmol/L (ref 98–111)
Creatinine, Ser: 0.6 mg/dL (ref 0.44–1.00)
GFR, Estimated: >60 mL/min (ref >60)
Glucose, Bld: 101 mg/dL — ABNORMAL HIGH (ref 70–99)
Potassium: 4 mmol/L (ref 3.5–5.1)
Sodium: 138 mmol/L (ref 135–145)
Total Bilirubin: 0.5 mg/dL (ref 0.3–1.2)
Total Protein: 7.7 g/dL (ref 6.5–8.1)

## 2021-03-26 LAB — CBC WITH DIFFERENTIAL/PLATELET
Abs Immature Granulocytes: 0.02 10*3/uL (ref 0.00–0.07)
Basophils Absolute: 0 10*3/uL (ref 0.0–0.1)
Basophils Relative: 1 %
Eosinophils Absolute: 0.1 10*3/uL (ref 0.0–0.5)
Eosinophils Relative: 2 %
HCT: 38.4 % (ref 36.0–46.0)
Hemoglobin: 13.4 g/dL (ref 12.0–15.0)
Immature Granulocytes: 0 %
Lymphocytes Relative: 38 %
Lymphs Abs: 2.2 10*3/uL (ref 0.7–4.0)
MCH: 28.7 pg (ref 26.0–34.0)
MCHC: 34.9 g/dL (ref 30.0–36.0)
MCV: 82.2 fL (ref 80.0–100.0)
Monocytes Absolute: 0.5 10*3/uL (ref 0.1–1.0)
Monocytes Relative: 8 %
Neutro Abs: 2.9 10*3/uL (ref 1.7–7.7)
Neutrophils Relative %: 51 %
Platelets: 249 10*3/uL (ref 150–400)
RBC: 4.67 MIL/uL (ref 3.87–5.11)
RDW: 13.8 % (ref 11.5–15.5)
WBC: 5.7 10*3/uL (ref 4.0–10.5)
nRBC: 0 % (ref 0.0–0.2)

## 2021-03-26 MED ORDER — ZOLEDRONIC ACID 4 MG/100ML IV SOLN
4.0000 mg | Freq: Once | INTRAVENOUS | Status: AC
  Administered 2021-03-26: 4 mg via INTRAVENOUS
  Filled 2021-03-26: qty 100

## 2021-03-26 MED ORDER — SODIUM CHLORIDE 0.9 % IV SOLN
Freq: Once | INTRAVENOUS | Status: AC
  Filled 2021-03-26: qty 250

## 2021-03-30 ENCOUNTER — Encounter: Payer: Self-pay | Admitting: Oncology

## 2021-03-31 ENCOUNTER — Encounter: Payer: Self-pay | Admitting: Oncology

## 2021-04-05 ENCOUNTER — Telehealth: Payer: Self-pay

## 2021-04-05 NOTE — Telephone Encounter (Signed)
T/C to pt - no answer- left v/m for her to call back- to discuss her scheduling possible NAC tattoo.

## 2021-04-12 ENCOUNTER — Encounter: Payer: Self-pay | Admitting: Oncology

## 2021-04-12 NOTE — Telephone Encounter (Signed)
We can send her a 1 time prescription for oxycodone 5 mg Q8 prn 30 tab no refills

## 2021-04-13 ENCOUNTER — Telehealth: Payer: Self-pay

## 2021-04-13 ENCOUNTER — Other Ambulatory Visit: Payer: Self-pay

## 2021-04-13 MED ORDER — OXYCODONE HCL 5 MG PO TABS: 5.0000 mg | ORAL_TABLET | Freq: Three times a day (TID) | ORAL | 0 refills | Status: DC | PRN

## 2021-04-13 NOTE — Telephone Encounter (Signed)
Called pt to let her know prescription for oxycodone was sent over to [facility] for [facility], pt did not answer left VM with minimum information.

## 2021-04-15 ENCOUNTER — Telehealth: Payer: Self-pay

## 2021-04-15 NOTE — Telephone Encounter (Signed)
Received a PA from covermymeds for oxyCodone rx. Pt has already picked up medication the day before yesterday according to pharmacy. When entering PA request info, covermymeds did not find request. Disgarding PA.

## 2021-04-28 ENCOUNTER — Encounter: Payer: Self-pay | Admitting: Oncology

## 2021-04-30 ENCOUNTER — Other Ambulatory Visit: Payer: Self-pay | Admitting: *Deleted

## 2021-04-30 ENCOUNTER — Encounter: Payer: Self-pay | Admitting: Oncology

## 2021-04-30 MED ORDER — BENZONATATE 200 MG PO CAPS: 200.0000 mg | ORAL_CAPSULE | Freq: Three times a day (TID) | ORAL | 0 refills | Status: DC | PRN

## 2021-05-05 ENCOUNTER — Other Ambulatory Visit: Payer: Self-pay

## 2021-05-05 ENCOUNTER — Encounter: Payer: Self-pay | Admitting: Surgical

## 2021-05-05 ENCOUNTER — Ambulatory Visit (INDEPENDENT_AMBULATORY_CARE_PROVIDER_SITE_OTHER): Payer: Commercial Managed Care - PPO | Admitting: Surgical

## 2021-05-05 DIAGNOSIS — Z9013 Acquired absence of bilateral breasts and nipples: Secondary | ICD-10-CM

## 2021-05-05 DIAGNOSIS — C50411 Malignant neoplasm of upper-outer quadrant of right female breast: Secondary | ICD-10-CM

## 2021-05-05 DIAGNOSIS — Z170 Estrogen receptor positive status [ER+]: Secondary | ICD-10-CM

## 2021-05-05 NOTE — Progress Notes (Signed)
   Subjective:     Patient ID: [name], female    DOB: 1980/12/19, 40 y.o.   MRN: [id number]  Chief Complaint  Patient presents with   Follow-up    HPI: The patient is a 40 y.o. female here for follow-up after exchange of bilateral tissue expanders for bilateral breast implants with Dr. Marla Roe on 01/06/2021.  She is approximately 4 months postop.  She had Mentor 750 cc smooth round ultrahigh profile gel implants placed bilaterally.  Patient reports overall she is doing well, she reports some contour concerns with the left breast that bother her.  Reports it mostly bothers her with movement of her left chest muscle but also at rest she notices some contour/divots that bother her.  She is interested in options for improving this.  Review of Systems  Constitutional: Negative.   Respiratory: Negative.    Cardiovascular: Negative.     Objective:   Vital Signs LMP 05/06/2010 (Within Years)  Nursing Note Reviewed Chaperone present Physical Exam Constitutional:      Appearance: Normal appearance. She is normal weight.  HENT:     Head: Normocephalic and atraumatic.  Chest:     Comments: Bilateral breast incisions are intact and very well-healed.  There is no erythema or cellulitic changes.  Bilateral breasts are symmetric.  Some volume loss noted medially of the left breast.  Also some superior pole volume loss is noted, this however is not too significant. Skin:    General: Skin is warm and dry.     Capillary Refill: Capillary refill takes less than 2 seconds.     Findings: No erythema.  Neurological:     General: No focal deficit present.     Mental Status: She is alert and oriented to person, place, and time.  Psychiatric:        Mood and Affect: Mood normal.        Behavior: Behavior normal.      Assessment/Plan:     ICD-10-CM   1. Absence of breast, acquired, bilateral  Z90.13     2. Malignant neoplasm of upper-outer quadrant of right breast in female,  estrogen receptor positive ([place])  C50.411    Z3.34       40 year old female here for follow-up on her bilateral breast reconstruction after breast cancer.  She underwent implant placement on 01/06/2021.  She is 4 months postop from this.  She is interested in options for improving contour of bilateral breasts, specifically left greater than right.  She has some volume loss that is accentuated with movement of the left pectoralis muscle.  We discussed fat grafting is an option to improve the shape and contour and fill some areas of volume loss.  Patient is interested in this.  I discussed with her that I would route this to our surgical team and discussed this with Dr. Marla Roe and we would certainly be in touch.  Patient was understanding and in agreement with this.  We discussed nipple areola tattooing as well, she could have nipple areolar tattooing prior to fat grafting if she would like.  Recommend calling with questions or concerns. Pictures were taken and placed in the patient's chart with patient permission   Charlies Constable, PA-C 05/05/2021, 3:42 PM

## 2021-05-12 ENCOUNTER — Other Ambulatory Visit: Payer: Self-pay | Admitting: Hospice and Palliative Medicine

## 2021-05-12 ENCOUNTER — Other Ambulatory Visit: Payer: Self-pay | Admitting: Oncology

## 2021-05-12 ENCOUNTER — Other Ambulatory Visit: Payer: Self-pay | Admitting: *Deleted

## 2021-05-13 ENCOUNTER — Encounter: Payer: Self-pay | Admitting: Oncology

## 2021-05-13 MED ORDER — OXYCODONE HCL 5 MG PO TABS: 5.0000 mg | ORAL_TABLET | Freq: Three times a day (TID) | ORAL | 0 refills | Status: AC | PRN

## 2021-05-13 MED ORDER — ALPRAZOLAM 0.5 MG PO TABS: 0.5000 mg | ORAL_TABLET | Freq: Three times a day (TID) | ORAL | 0 refills | Status: DC | PRN

## 2021-05-17 ENCOUNTER — Other Ambulatory Visit: Payer: Self-pay | Admitting: *Deleted

## 2021-05-17 MED ORDER — TRAZODONE HCL 50 MG PO TABS: 50.0000 mg | ORAL_TABLET | Freq: Every evening | ORAL | 0 refills | Status: DC | PRN

## 2021-05-21 ENCOUNTER — Encounter: Payer: Self-pay | Admitting: Oncology

## 2021-05-23 NOTE — Telephone Encounter (Signed)
Lifting restrictions would be as per surgery. Compression sleeve- what was OT recommendation for her? I think it would help. We can check ferritin and iron studies b12 and folate this week along with cbc

## 2021-05-26 ENCOUNTER — Other Ambulatory Visit: Payer: Self-pay

## 2021-05-26 ENCOUNTER — Other Ambulatory Visit: Payer: Self-pay | Admitting: *Deleted

## 2021-05-26 ENCOUNTER — Inpatient Hospital Stay: Payer: Commercial Managed Care - PPO

## 2021-05-26 ENCOUNTER — Inpatient Hospital Stay: Payer: Commercial Managed Care - PPO | Attending: Oncology | Admitting: Oncology

## 2021-05-26 VITALS — BP 125/94 | HR 76 | Temp 97.5°F | Resp 18 | Wt 202.9 lb

## 2021-05-26 DIAGNOSIS — Z5181 Encounter for therapeutic drug level monitoring: Secondary | ICD-10-CM | POA: Diagnosis not present

## 2021-05-26 DIAGNOSIS — Z79818 Long term (current) use of other agents affecting estrogen receptors and estrogen levels: Secondary | ICD-10-CM | POA: Diagnosis not present

## 2021-05-26 DIAGNOSIS — C50411 Malignant neoplasm of upper-outer quadrant of right female breast: Secondary | ICD-10-CM

## 2021-05-26 DIAGNOSIS — Z170 Estrogen receptor positive status [ER+]: Secondary | ICD-10-CM

## 2021-05-26 DIAGNOSIS — Z79899 Other long term (current) drug therapy: Secondary | ICD-10-CM | POA: Insufficient documentation

## 2021-05-26 DIAGNOSIS — Z79811 Long term (current) use of aromatase inhibitors: Secondary | ICD-10-CM | POA: Insufficient documentation

## 2021-05-26 DIAGNOSIS — Z5111 Encounter for antineoplastic chemotherapy: Secondary | ICD-10-CM | POA: Insufficient documentation

## 2021-05-26 DIAGNOSIS — R5383 Other fatigue: Secondary | ICD-10-CM

## 2021-05-26 LAB — CBC WITH DIFFERENTIAL/PLATELET
Abs Immature Granulocytes: 0.02 10*3/uL (ref 0.00–0.07)
Basophils Absolute: 0 10*3/uL (ref 0.0–0.1)
Basophils Relative: 0 %
Eosinophils Absolute: 0.1 10*3/uL (ref 0.0–0.5)
Eosinophils Relative: 2 %
HCT: 38.4 % (ref 36.0–46.0)
Hemoglobin: 13.3 g/dL (ref 12.0–15.0)
Immature Granulocytes: 0 %
Lymphocytes Relative: 46 %
Lymphs Abs: 2.2 10*3/uL (ref 0.7–4.0)
MCH: 29 pg (ref 26.0–34.0)
MCHC: 34.6 g/dL (ref 30.0–36.0)
MCV: 83.8 fL (ref 80.0–100.0)
Monocytes Absolute: 0.4 10*3/uL (ref 0.1–1.0)
Monocytes Relative: 8 %
Neutro Abs: 2.1 10*3/uL (ref 1.7–7.7)
Neutrophils Relative %: 44 %
Platelets: 252 10*3/uL (ref 150–400)
RBC: 4.58 MIL/uL (ref 3.87–5.11)
RDW: 13.1 % (ref 11.5–15.5)
WBC: 4.8 10*3/uL (ref 4.0–10.5)
nRBC: 0 % (ref 0.0–0.2)

## 2021-05-26 LAB — IRON AND TIBC
Iron: 66 ug/dL (ref 28–170)
Saturation Ratios: 17 % (ref 10.4–31.8)
TIBC: 400 ug/dL (ref 250–450)
UIBC: 334 ug/dL

## 2021-05-26 LAB — VITAMIN B12: Vitamin B-12: 4846 pg/mL — ABNORMAL HIGH (ref 180–914)

## 2021-05-26 LAB — VITAMIN D 25 HYDROXY (VIT D DEFICIENCY, FRACTURES): Vit D, 25-Hydroxy: 34.78 ng/mL (ref 30–100)

## 2021-05-26 LAB — FERRITIN: Ferritin: 55 ng/mL (ref 11–307)

## 2021-05-26 MED ORDER — LEUPROLIDE ACETATE (3 MONTH) 11.25 MG IM KIT
11.2500 mg | PACK | INTRAMUSCULAR | Status: DC
  Administered 2021-05-26: 11.25 mg via INTRAMUSCULAR
  Filled 2021-05-26: qty 11.25

## 2021-05-26 MED ORDER — PROMETHAZINE HCL 25 MG PO TABS: 12.5000 mg | ORAL_TABLET | Freq: Four times a day (QID) | ORAL | 0 refills | Status: AC | PRN

## 2021-05-26 NOTE — Progress Notes (Signed)
Pt states that for the past couple of weeks she has been having headaches lasting anywhere from all day to 4-5 hours. Tries tylenol but does not really work, tries caffeine but does not help either.

## 2021-05-28 ENCOUNTER — Ambulatory Visit: Payer: Medicaid Other | Admitting: Oncology

## 2021-05-28 ENCOUNTER — Ambulatory Visit: Payer: Medicaid Other

## 2021-05-29 ENCOUNTER — Encounter: Payer: Self-pay | Admitting: Oncology
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
## 2021-06-02 ENCOUNTER — Encounter: Payer: Self-pay | Admitting: Plastic Surgery

## 2021-06-04 ENCOUNTER — Telehealth (INDEPENDENT_AMBULATORY_CARE_PROVIDER_SITE_OTHER): Payer: Commercial Managed Care - PPO | Admitting: Plastic Surgery

## 2021-06-04 ENCOUNTER — Encounter: Payer: Self-pay | Admitting: Plastic Surgery

## 2021-06-04 ENCOUNTER — Encounter: Payer: Self-pay | Admitting: Oncology

## 2021-06-04 DIAGNOSIS — N6489 Other specified disorders of breast: Secondary | ICD-10-CM | POA: Diagnosis not present

## 2021-06-04 NOTE — Progress Notes (Signed)
   Subjective:    Patient ID: [name], female    DOB: November 29, 1980, 40 y.o.   MRN: [id number]  The patient is a 40 year old female joining me by telemetry visit for further discussion about her breasts.  She underwent mastectomy with reconstruction and March 2022.  She has Mentor 750 cc smooth round ultrahigh profile silicone implants in place.  She has some asymmetry and contour irregularities that she would like to have improved upon.  She is 5 feet 7 inches tall weighs 200 pounds.  Overall she is doing well and feeling good.     Review of Systems  Constitutional: Negative.   Eyes: Negative.   Respiratory: Negative.    Cardiovascular: Negative.   Gastrointestinal: Negative.   Endocrine: Negative.   Genitourinary: Negative.   Neurological: Negative.   Hematological: Negative.   Psychiatric/Behavioral: Negative.        Objective:   Physical Exam      Assessment & Plan:     ICD-10-CM   1. Breast asymmetry  N64.89       I connected with  Tawny Asal Coto on 06/04/21 by a video enabled telemedicine application and verified that I am speaking with the correct person using two identifiers.   I discussed the limitations of evaluation and management by telemedicine. The patient expressed understanding and agreed to proceed.  The patient was at work and I was at work.  I spent 15 minutes in the following way: Discussion with the patient, review of chart ,discussion of plan and documentation.  The patient is a good candidate for bilateral Lipo filling for improvement of breast contour.  She understands she will need to take about a week off from work.  She can do the tattooing for the nipple areolar reconstruction at any time.

## 2021-06-09 ENCOUNTER — Encounter: Payer: Self-pay | Admitting: Plastic Surgery

## 2021-06-10 ENCOUNTER — Other Ambulatory Visit: Payer: Self-pay | Admitting: *Deleted

## 2021-06-10 ENCOUNTER — Encounter: Payer: Self-pay | Admitting: Oncology

## 2021-06-10 MED ORDER — EXEMESTANE 25 MG PO TABS: 25.0000 mg | ORAL_TABLET | Freq: Every day | ORAL | 2 refills | Status: DC

## 2021-06-16 ENCOUNTER — Encounter: Payer: Self-pay | Admitting: Oncology

## 2021-06-22 ENCOUNTER — Other Ambulatory Visit: Payer: Self-pay

## 2021-06-22 ENCOUNTER — Encounter: Payer: Self-pay | Admitting: Physician Assistant

## 2021-06-22 ENCOUNTER — Ambulatory Visit (INDEPENDENT_AMBULATORY_CARE_PROVIDER_SITE_OTHER): Payer: Commercial Managed Care - PPO | Admitting: Physician Assistant

## 2021-06-22 VITALS — BP 128/80 | HR 78 | Ht 67.5 in | Wt 199.0 lb

## 2021-06-22 DIAGNOSIS — Z719 Counseling, unspecified: Secondary | ICD-10-CM

## 2021-06-22 DIAGNOSIS — Z9013 Acquired absence of bilateral breasts and nipples: Secondary | ICD-10-CM

## 2021-06-22 MED ORDER — DOXYCYCLINE HYCLATE 100 MG PO TABS: 100.0000 mg | ORAL_TABLET | Freq: Two times a day (BID) | ORAL | 0 refills | Status: AC

## 2021-06-22 MED ORDER — PROMETHAZINE HCL 12.5 MG PO TABS
12.5000 mg | ORAL_TABLET | Freq: Three times a day (TID) | ORAL | 0 refills | Status: DC | PRN
Start: 2021-06-22 — End: 2021-10-06

## 2021-06-22 MED ORDER — HYDROCODONE-ACETAMINOPHEN 5-325 MG PO TABS: 1.0000 | ORAL_TABLET | Freq: Four times a day (QID) | ORAL | 0 refills | Status: DC | PRN
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
## 2021-06-24 ENCOUNTER — Encounter: Payer: Self-pay | Admitting: Oncology

## 2021-06-29 ENCOUNTER — Encounter (HOSPITAL_BASED_OUTPATIENT_CLINIC_OR_DEPARTMENT_OTHER): Payer: Self-pay | Admitting: Plastic Surgery

## 2021-06-29 ENCOUNTER — Other Ambulatory Visit: Payer: Self-pay

## 2021-06-30 ENCOUNTER — Other Ambulatory Visit: Payer: Self-pay | Admitting: *Deleted

## 2021-07-01 ENCOUNTER — Encounter: Payer: Self-pay | Admitting: Oncology

## 2021-07-01 DIAGNOSIS — M79672 Pain in left foot: Secondary | ICD-10-CM | POA: Insufficient documentation

## 2021-07-01 DIAGNOSIS — K219 Gastro-esophageal reflux disease without esophagitis: Secondary | ICD-10-CM | POA: Insufficient documentation

## 2021-07-01 DIAGNOSIS — G4700 Insomnia, unspecified: Secondary | ICD-10-CM | POA: Insufficient documentation

## 2021-07-01 DIAGNOSIS — N200 Calculus of kidney: Secondary | ICD-10-CM | POA: Insufficient documentation

## 2021-07-01 DIAGNOSIS — Z90710 Acquired absence of both cervix and uterus: Secondary | ICD-10-CM | POA: Insufficient documentation

## 2021-07-01 DIAGNOSIS — R7989 Other specified abnormal findings of blood chemistry: Secondary | ICD-10-CM | POA: Insufficient documentation

## 2021-07-01 DIAGNOSIS — G8929 Other chronic pain: Secondary | ICD-10-CM | POA: Insufficient documentation

## 2021-07-01 DIAGNOSIS — R569 Unspecified convulsions: Secondary | ICD-10-CM | POA: Insufficient documentation

## 2021-07-01 MED ORDER — HYDROCODONE-ACETAMINOPHEN 5-325 MG PO TABS: 1.0000 | ORAL_TABLET | Freq: Four times a day (QID) | ORAL | 0 refills | Status: AC | PRN

## 2021-07-02 ENCOUNTER — Telehealth: Payer: Self-pay

## 2021-07-02 ENCOUNTER — Other Ambulatory Visit: Payer: Self-pay

## 2021-07-02 DIAGNOSIS — M549 Dorsalgia, unspecified: Secondary | ICD-10-CM

## 2021-07-02 NOTE — Telephone Encounter (Signed)
Pain Clinic referral made to [facility], orders were faxed over and received confirmation.

## 2021-07-08 ENCOUNTER — Ambulatory Visit (HOSPITAL_BASED_OUTPATIENT_CLINIC_OR_DEPARTMENT_OTHER): Payer: Commercial Managed Care - PPO | Admitting: Anesthesiology

## 2021-07-08 ENCOUNTER — Ambulatory Visit (HOSPITAL_BASED_OUTPATIENT_CLINIC_OR_DEPARTMENT_OTHER)
Admission: RE | Admit: 2021-07-08 | Discharge: 2021-07-08 | Disposition: A | Discharge status: Home / Self Care | Payer: Commercial Managed Care - PPO | Source: Ambulatory Visit | Attending: Plastic Surgery | Admitting: Plastic Surgery

## 2021-07-08 ENCOUNTER — Encounter (HOSPITAL_BASED_OUTPATIENT_CLINIC_OR_DEPARTMENT_OTHER)
Admission: RE | Disposition: A | Discharge status: Home / Self Care | Payer: Self-pay | Source: Ambulatory Visit | Attending: Plastic Surgery

## 2021-07-08 ENCOUNTER — Other Ambulatory Visit: Payer: Self-pay

## 2021-07-08 ENCOUNTER — Encounter (HOSPITAL_BASED_OUTPATIENT_CLINIC_OR_DEPARTMENT_OTHER): Payer: Self-pay | Admitting: Plastic Surgery

## 2021-07-08 DIAGNOSIS — Z9013 Acquired absence of bilateral breasts and nipples: Secondary | ICD-10-CM

## 2021-07-08 DIAGNOSIS — Z90710 Acquired absence of both cervix and uterus: Secondary | ICD-10-CM | POA: Insufficient documentation

## 2021-07-08 DIAGNOSIS — Z881 Allergy status to other antibiotic agents status: Secondary | ICD-10-CM | POA: Diagnosis not present

## 2021-07-08 DIAGNOSIS — L723 Sebaceous cyst: Secondary | ICD-10-CM | POA: Insufficient documentation

## 2021-07-08 DIAGNOSIS — Z803 Family history of malignant neoplasm of breast: Secondary | ICD-10-CM | POA: Diagnosis not present

## 2021-07-08 DIAGNOSIS — N6089 Other benign mammary dysplasias of unspecified breast: Secondary | ICD-10-CM | POA: Insufficient documentation

## 2021-07-08 DIAGNOSIS — Z880 Allergy status to penicillin: Secondary | ICD-10-CM | POA: Insufficient documentation

## 2021-07-08 DIAGNOSIS — Z9049 Acquired absence of other specified parts of digestive tract: Secondary | ICD-10-CM | POA: Insufficient documentation

## 2021-07-08 DIAGNOSIS — Z91040 Latex allergy status: Secondary | ICD-10-CM | POA: Diagnosis not present

## 2021-07-08 DIAGNOSIS — N6489 Other specified disorders of breast: Secondary | ICD-10-CM | POA: Diagnosis present

## 2021-07-08 DIAGNOSIS — Z8052 Family history of malignant neoplasm of bladder: Secondary | ICD-10-CM | POA: Insufficient documentation

## 2021-07-08 DIAGNOSIS — Z888 Allergy status to other drugs, medicaments and biological substances status: Secondary | ICD-10-CM | POA: Insufficient documentation

## 2021-07-08 DIAGNOSIS — C50411 Malignant neoplasm of upper-outer quadrant of right female breast: Secondary | ICD-10-CM

## 2021-07-08 DIAGNOSIS — Z79899 Other long term (current) drug therapy: Secondary | ICD-10-CM | POA: Insufficient documentation

## 2021-07-08 DIAGNOSIS — Z853 Personal history of malignant neoplasm of breast: Secondary | ICD-10-CM | POA: Diagnosis not present

## 2021-07-08 DIAGNOSIS — Z170 Estrogen receptor positive status [ER+]: Secondary | ICD-10-CM

## 2021-07-08 DIAGNOSIS — Z8669 Personal history of other diseases of the nervous system and sense organs: Secondary | ICD-10-CM | POA: Insufficient documentation

## 2021-07-08 DIAGNOSIS — Z800 Family history of malignant neoplasm of digestive organs: Secondary | ICD-10-CM | POA: Insufficient documentation

## 2021-07-08 HISTORY — PX: LIPOSUCTION WITH LIPOFILLING: SHX6436

## 2021-07-08 HISTORY — PX: CYST EXCISION: SHX5701

## 2021-07-08 SURGERY — LIPOSUCTION, WITH FAT TRANSFER
Anesthesia: General | Site: Breast | Laterality: Right

## 2021-07-08 MED ORDER — FENTANYL CITRATE (PF) 100 MCG/2ML IJ SOLN
INTRAMUSCULAR | Status: AC
  Filled 2021-07-08: qty 2

## 2021-07-08 MED ORDER — CEFAZOLIN SODIUM-DEXTROSE 2-4 GM/100ML-% IV SOLN
2.0000 g | INTRAVENOUS | Status: AC
  Administered 2021-07-08: 2 g via INTRAVENOUS

## 2021-07-08 MED ORDER — PROPOFOL 10 MG/ML IV BOLUS
INTRAVENOUS | Status: DC | PRN
  Administered 2021-07-08: 200 mg via INTRAVENOUS

## 2021-07-08 MED ORDER — OXYCODONE HCL 5 MG PO TABS: 5.0000 mg | ORAL_TABLET | Freq: Once | ORAL | Status: DC | PRN

## 2021-07-08 MED ORDER — MEPERIDINE HCL 25 MG/ML IJ SOLN: 6.2500 mg | INTRAMUSCULAR | Status: DC | PRN

## 2021-07-08 MED ORDER — SODIUM CHLORIDE 0.9 % IV SOLN: 250.0000 mL | INTRAVENOUS | Status: DC | PRN

## 2021-07-08 MED ORDER — ACETAMINOPHEN 325 MG PO TABS: 650.0000 mg | ORAL_TABLET | ORAL | Status: DC | PRN

## 2021-07-08 MED ORDER — CEFAZOLIN SODIUM-DEXTROSE 2-4 GM/100ML-% IV SOLN
INTRAVENOUS | Status: AC
  Filled 2021-07-08: qty 100

## 2021-07-08 MED ORDER — CHLORHEXIDINE GLUCONATE CLOTH 2 % EX PADS: 6.0000 | MEDICATED_PAD | Freq: Once | CUTANEOUS | Status: DC

## 2021-07-08 MED ORDER — MIDAZOLAM HCL 2 MG/2ML IJ SOLN
INTRAMUSCULAR | Status: AC
  Filled 2021-07-08: qty 2

## 2021-07-08 MED ORDER — EPHEDRINE SULFATE 50 MG/ML IJ SOLN
INTRAMUSCULAR | Status: DC | PRN
  Administered 2021-07-08: 10 mg via INTRAVENOUS

## 2021-07-08 MED ORDER — AMISULPRIDE (ANTIEMETIC) 5 MG/2ML IV SOLN
10.0000 mg | Freq: Once | INTRAVENOUS | Status: AC | PRN
  Administered 2021-07-08: 10 mg via INTRAVENOUS

## 2021-07-08 MED ORDER — OXYCODONE HCL 5 MG PO TABS: 5.0000 mg | ORAL_TABLET | ORAL | Status: DC | PRN

## 2021-07-08 MED ORDER — SODIUM CHLORIDE 0.9% FLUSH: 3.0000 mL | Freq: Two times a day (BID) | INTRAVENOUS | Status: DC

## 2021-07-08 MED ORDER — LIDOCAINE HCL (CARDIAC) PF 100 MG/5ML IV SOSY
PREFILLED_SYRINGE | INTRAVENOUS | Status: DC | PRN
  Administered 2021-07-08: 60 mg via INTRAVENOUS

## 2021-07-08 MED ORDER — PROMETHAZINE HCL 25 MG/ML IJ SOLN: 6.2500 mg | INTRAMUSCULAR | Status: DC | PRN

## 2021-07-08 MED ORDER — SODIUM CHLORIDE 0.9% FLUSH: 3.0000 mL | INTRAVENOUS | Status: DC | PRN

## 2021-07-08 MED ORDER — AMISULPRIDE (ANTIEMETIC) 5 MG/2ML IV SOLN
INTRAVENOUS | Status: AC
  Filled 2021-07-08: qty 2

## 2021-07-08 MED ORDER — EPHEDRINE 5 MG/ML INJ
INTRAVENOUS | Status: AC
  Filled 2021-07-08: qty 5

## 2021-07-08 MED ORDER — FENTANYL CITRATE (PF) 100 MCG/2ML IJ SOLN
25.0000 ug | INTRAMUSCULAR | Status: DC | PRN
  Administered 2021-07-08: 50 ug via INTRAVENOUS

## 2021-07-08 MED ORDER — OXYCODONE HCL 5 MG/5ML PO SOLN
5.0000 mg | Freq: Once | ORAL | Status: DC | PRN
Start: 2021-07-08 — End: 2021-07-08

## 2021-07-08 MED ORDER — DEXAMETHASONE SODIUM PHOSPHATE 4 MG/ML IJ SOLN
INTRAMUSCULAR | Status: DC | PRN
  Administered 2021-07-08: 5 mg via INTRAVENOUS

## 2021-07-08 MED ORDER — FENTANYL CITRATE (PF) 100 MCG/2ML IJ SOLN
INTRAMUSCULAR | Status: DC | PRN
  Administered 2021-07-08: 25 ug via INTRAVENOUS
  Administered 2021-07-08: 100 ug via INTRAVENOUS
  Administered 2021-07-08: 50 ug via INTRAVENOUS
  Administered 2021-07-08: 25 ug via INTRAVENOUS

## 2021-07-08 MED ORDER — ACETAMINOPHEN 325 MG RE SUPP: 650.0000 mg | RECTAL | Status: DC | PRN

## 2021-07-08 MED ORDER — LIDOCAINE HCL 1 % IJ SOLN
INTRAVENOUS | Status: DC | PRN
  Administered 2021-07-08: 300 mL

## 2021-07-08 MED ORDER — LIDOCAINE-EPINEPHRINE 1 %-1:100000 IJ SOLN
INTRAMUSCULAR | Status: DC | PRN
  Administered 2021-07-08: 5 mL via INTRAMUSCULAR

## 2021-07-08 MED ORDER — LACTATED RINGERS IV SOLN: INTRAVENOUS | Status: DC

## 2021-07-08 MED ORDER — HYDROMORPHONE HCL 1 MG/ML IJ SOLN: 0.2500 mg | INTRAMUSCULAR | Status: DC | PRN

## 2021-07-08 MED ORDER — ONDANSETRON HCL 4 MG/2ML IJ SOLN
INTRAMUSCULAR | Status: DC | PRN
  Administered 2021-07-08: 4 mg via INTRAVENOUS

## 2021-07-08 MED ORDER — MIDAZOLAM HCL 5 MG/5ML IJ SOLN
INTRAMUSCULAR | Status: DC | PRN
  Administered 2021-07-08: 2 mg via INTRAVENOUS

## 2021-07-08 MED ORDER — DEXAMETHASONE SODIUM PHOSPHATE 10 MG/ML IJ SOLN
INTRAMUSCULAR | Status: AC
  Filled 2021-07-08: qty 1

## 2021-07-08 MED ORDER — ONDANSETRON HCL 4 MG/2ML IJ SOLN
INTRAMUSCULAR | Status: AC
  Filled 2021-07-08: qty 2

## 2021-07-08 SURGICAL SUPPLY — 49 items
ADH SKN CLS APL DERMABOND .7 (GAUZE/BANDAGES/DRESSINGS) ×2
BINDER ABDOMINAL  9 SM 30-45 (SOFTGOODS)
BINDER ABDOMINAL 10 UNV 27-48 (MISCELLANEOUS) IMPLANT
BINDER ABDOMINAL 12 SM 30-45 (SOFTGOODS) ×3 IMPLANT
BINDER ABDOMINAL 9 SM 30-45 (SOFTGOODS) IMPLANT
BINDER BREAST LRG (GAUZE/BANDAGES/DRESSINGS) IMPLANT
BINDER BREAST MEDIUM (GAUZE/BANDAGES/DRESSINGS) IMPLANT
BINDER BREAST XLRG (GAUZE/BANDAGES/DRESSINGS) ×3 IMPLANT
BINDER BREAST XXLRG (GAUZE/BANDAGES/DRESSINGS) IMPLANT
BLADE HEX COATED 2.75 (ELECTRODE) IMPLANT
BLADE SURG 15 STRL LF DISP TIS (BLADE) ×2 IMPLANT
BLADE SURG 15 STRL SS (BLADE) ×3
COVER BACK TABLE 60X90IN (DRAPES) ×3 IMPLANT
COVER MAYO STAND STRL (DRAPES) ×3 IMPLANT
DECANTER SPIKE VIAL GLASS SM (MISCELLANEOUS) IMPLANT
DERMABOND ADVANCED (GAUZE/BANDAGES/DRESSINGS) ×1
DERMABOND ADVANCED .7 DNX12 (GAUZE/BANDAGES/DRESSINGS) ×2 IMPLANT
DRAPE LAPAROSCOPIC ABDOMINAL (DRAPES) ×3 IMPLANT
DRSG PAD ABDOMINAL 8X10 ST (GAUZE/BANDAGES/DRESSINGS) ×6 IMPLANT
ELECT REM PT RETURN 9FT ADLT (ELECTROSURGICAL) ×3
ELECTRODE REM PT RTRN 9FT ADLT (ELECTROSURGICAL) ×2 IMPLANT
EXTRACTOR CANIST REVOLVE STRL (CANNISTER) ×3 IMPLANT
GLOVE SURG ENC MOIS LTX SZ6 (GLOVE) IMPLANT
GLOVE SURG POLYISO LF SZ6.5 (GLOVE) ×15 IMPLANT
GLOVE SURG UNDER POLY LF SZ6.5 (GLOVE) ×6 IMPLANT
GOWN STRL REUS W/ TWL LRG LVL3 (GOWN DISPOSABLE) ×6 IMPLANT
GOWN STRL REUS W/TWL LRG LVL3 (GOWN DISPOSABLE) ×9
IV LACTATED RINGERS 1000ML (IV SOLUTION) ×9 IMPLANT
LINER CANISTER 1000CC FLEX (MISCELLANEOUS) ×3 IMPLANT
NDL SAFETY ECLIPSE 18X1.5 (NEEDLE) ×2 IMPLANT
NEEDLE HYPO 18GX1.5 SHARP (NEEDLE) ×3
NEEDLE HYPO 25X1 1.5 SAFETY (NEEDLE) ×3 IMPLANT
PACK BASIN DAY SURGERY FS (CUSTOM PROCEDURE TRAY) ×3 IMPLANT
PAD ALCOHOL SWAB (MISCELLANEOUS) IMPLANT
PENCIL SMOKE EVACUATOR (MISCELLANEOUS) IMPLANT
SLEEVE SCD COMPRESS KNEE MED (STOCKING) ×3 IMPLANT
SPONGE T-LAP 18X18 ~~LOC~~+RFID (SPONGE) ×3 IMPLANT
SUT MNCRL AB 4-0 PS2 18 (SUTURE) IMPLANT
SUT MON AB 5-0 PS2 18 (SUTURE) ×3 IMPLANT
SYR 10ML LL (SYRINGE) ×12 IMPLANT
SYR 3ML 18GX1 1/2 (SYRINGE) IMPLANT
SYR 50ML LL SCALE MARK (SYRINGE) ×3 IMPLANT
SYR CONTROL 10ML LL (SYRINGE) ×3 IMPLANT
SYR TOOMEY 50ML (SYRINGE) IMPLANT
TOWEL GREEN STERILE FF (TOWEL DISPOSABLE) ×6 IMPLANT
TRAY DSU PREP LF (CUSTOM PROCEDURE TRAY) ×3 IMPLANT
TUBING INFILTRATION IT-10001 (TUBING) ×3 IMPLANT
TUBING SET GRADUATE ASPIR 12FT (MISCELLANEOUS) ×6 IMPLANT
UNDERPAD 30X36 HEAVY ABSORB (UNDERPADS AND DIAPERS) ×6 IMPLANT

## 2021-07-08 NOTE — Op Note (Signed)
DATE OF OPERATION: 07/08/2021  LOCATION: [facility]  PREOPERATIVE DIAGNOSIS: breast asymmetry after breast cancer, sebaceous cyst of abdomen  POSTOPERATIVE DIAGNOSIS: Same  PROCEDURE:  Lipofilling of bilateral breasts for symmetry 2.   Excision of 2 cm sebaceous cyst of abdomen  SURGEON: Lyndee Leo Sanger Misa Fedorko, DO  ASSISTANT: Krista Blue, PA  EBL: 5 cc  CONDITION: Stable  COMPLICATIONS: None  INDICATION: The patient, [name], is a 40 y.o. female born on 08-Mar-1981, is here for treatment of breast asymmetry after breast cancer treatment and bilateral mastectomies.   PROCEDURE DETAILS:  The patient was seen prior to surgery and marked.  The IV antibiotics were given. The patient was taken to the operating room and given a general anesthetic. A standard time out was performed and all information was confirmed by those in the room. SCDs were placed.   The skin was prepped and draped for the breast and abdomen.  The resisting scar of the abdomen was used to introduce the tumescent.  Local was injected and the #15 blade was used to make a 5 mm incision. The tumescent was infused into the adipose layer of the abdomen (500 cc).  The liposuction was then performed and 350 cc was removed.  The revolve was used to collect the specimen.  The specimen was prepared according to the manufacture guidelines.  The adipose was injected into each breast for improved contour with 140 cc on the left and 30 cc on the right.  The incisions were closed with the 5-0 Monocryl.    Attention was turned to the abdominal cyst.  Local was injected. The #15 blade was used to make an incision through the skin.  The 2 cm cyst was consistent with a sebaceous cyst.  The capsule was excised completely.  The skin was closed with the 5-0 Monocryl. A breast binder and abdominal binder was applied. The patient was allowed to wake up and taken to recovery room in stable condition at the end of the case.  The family was notified at the end of the case.   The advanced practice practitioner (APP) assisted throughout the case.  The APP was essential in retraction and counter traction when needed to make the case progress smoothly.  This retraction and assistance made it possible to see the tissue plans for the procedure.  The assistance was needed for blood control, tissue re-approximation and assisted with closure of the incision site.

## 2021-07-08 NOTE — Interval H&P Note (Signed)
History and Physical Interval Note:  07/08/2021 10:42 AM  [name]  has presented today for surgery, with the diagnosis of Bilateral lipofilling breasts for improved contour.  The various methods of treatment have been discussed with the patient and family. After consideration of risks, benefits and other options for treatment, the patient has consented to  Procedure(s): Bilateral lipofilling breasts for improved contour (Bilateral) as a surgical intervention.  The patient's history has been reviewed, patient examined, no change in status, stable for surgery.  I have reviewed the patient's chart and labs.  Questions were answered to the patient's satisfaction.     Loel Lofty Wonda Goodgame
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
## 2021-07-08 NOTE — Transfer of Care (Signed)
Immediate Anesthesia Transfer of Care Note  Patient: [name]  Procedure(s) Performed: Bilateral lipofilling breasts for improved contour (Bilateral: Breast)  Patient Location: PACU  Anesthesia Type:General  Level of Consciousness: awake  Airway & Oxygen Therapy: Patient Spontanous Breathing and Patient connected to face mask oxygen  Post-op Assessment: Report given to RN and Post -op Vital signs reviewed and stable  Post vital signs: Reviewed and stable  Last Vitals:  Vitals Value Taken Time  BP    Temp    Pulse    Resp    SpO2      Last Pain:  Vitals:   07/08/21 0810  TempSrc: Oral  PainSc: 0-No pain         Complications: No notable events documented.

## 2021-07-08 NOTE — Discharge Instructions (Addendum)
INSTRUCTIONS FOR AFTER SURGERY   You will likely have some questions about what to expect following your operation.  The following information will help you and your family understand what to expect when you are discharged from the hospital.  Following these guidelines will help ensure a smooth recovery and reduce risks of complications.  Postoperative instructions include information on: diet, wound care, medications and physical activity.  AFTER SURGERY Expect to go home after the procedure.  In some cases, you may need to spend one night in the hospital for observation.  DIET This surgery does not require a specific diet.  However, I have to mention that the healthier you eat the better your body can start healing. It is important to increasing your protein intake.  This means limiting the foods with added sugar.  Focus on fruits and vegetables and some meat. It is very important to drink water after your surgery.  If your urine is bright yellow, then it is concentrated, and you need to drink more water.  As a general rule after surgery, you should have 8 ounces of water every hour while awake.  If you find you are persistently nauseated or unable to take in liquids let us know.  NO TOBACCO USE or EXPOSURE.  This will slow your healing process and increase the risk of a wound.  WOUND CARE If you don't have a drain: You can shower the day after surgery.  Use fragrance free soap.  Dial, [place], [place] and Cetaphil are usually mild on the skin.  If you have steri-strips / tape directly attached to your skin leave them in place. It is OK to get these wet.  No baths, pools or hot tubs for two weeks. We close your incision to leave the smallest and best-looking scar. No ointment or creams on your incisions until given the go ahead.  Especially not Neosporin (Too many skin reactions with this one).  A few weeks after surgery you can use Mederma and start massaging the scar. We ask you to wear your binder or  sports bra for the first 6 weeks around the clock, including while sleeping. This provides added comfort and helps reduce the fluid accumulation at the surgery site.  ACTIVITY No heavy lifting until cleared by the doctor.  It is OK to walk and climb stairs. In fact, moving your legs is very important to decrease your risk of a blood clot.  It will also help keep you from getting deconditioned.  Every 1 to 2 hours get up and walk for 5 minutes. This will help with a quicker recovery back to normal.  Let pain be your guide so you don't do too much.  NO, you cannot do the spring cleaning and don't plan on taking care of anyone else.  This is your time for TLC.   WORK Everyone returns to work at different times. As a rough guide, most people take at least 1 - 2 weeks off prior to returning to work. If you need documentation for your job, bring the forms to your postoperative follow up visit.  DRIVING Arrange for someone to bring you home from the hospital.  You may be able to drive a few days after surgery but not while taking any narcotics or valium.  BOWEL MOVEMENTS Constipation can occur after anesthesia and while taking pain medication.  It is important to stay ahead for your comfort.  We recommend taking Milk of Magnesia (2 tablespoons; twice a day) while taking  the pain pills.  SEROMA This is fluid your body tried to put in the surgical site.  This is normal but if it creates excessive pain and swelling let us know.  It usually decreases in a few weeks.  MEDICATIONS and PAIN CONTROL At your preoperative visit for you history and physical you were given the following medications: An antibiotic: Start this medication when you get home and take according to the instructions on the bottle. Zofran 4 mg:  This is to treat nausea and vomiting.  You can take this every 6 hours as needed and only if needed. Norco (hydrocodone/acetaminophen) 5/325 mg:  This is only to be used after you have taken the  motrin or the tylenol. Every 8 hours as needed. Over the counter Medication to take: Ibuprofen (Motrin) 600 mg:  Take this every 6 hours.  If you have additional pain then take 500 mg of the tylenol.  Only take the Norco after you have tried these two. Miralax or stool softener of choice: Take this according to the bottle if you take the [place] Call your surgeon's office if any of the following occur:  Fever 101 degrees F or greater  Excessive bleeding or fluid from the incision site.  Pain that increases over time without aid from the medications  Redness, warmth, or pus draining from incision sites  Persistent nausea or inability to take in liquids  Severe misshapen area that underwent the operation.   Post Anesthesia Home Care Instructions  Activity: Get plenty of rest for the remainder of the day. A responsible individual must stay with you for 24 hours following the procedure.  For the next 24 hours, DO NOT: -Drive a car -Paediatric nurse -Drink alcoholic beverages -Take any medication unless instructed by your physician -Make any legal decisions or sign important papers.  Meals: Start with liquid foods such as gelatin or soup. Progress to regular foods as tolerated. Avoid greasy, spicy, heavy foods. If nausea and/or vomiting occur, drink only clear liquids until the nausea and/or vomiting subsides. Call your physician if vomiting continues.  Special Instructions/Symptoms: Your throat may feel dry or sore from the anesthesia or the breathing tube placed in your throat during surgery. If this causes discomfort, gargle with warm salt water. The discomfort should disappear within 24 hours.  If you had a scopolamine patch placed behind your ear for the management of post- operative nausea and/or vomiting:  1. The medication in the patch is effective for 72 hours, after which it should be removed.  Wrap patch in a tissue and discard in the trash. Wash hands thoroughly  with soap and water. 2. You may remove the patch earlier than 72 hours if you experience unpleasant side effects which may include dry mouth, dizziness or visual disturbances. 3. Avoid touching the patch. Wash your hands with soap and water after contact with the patch.

## 2021-07-08 NOTE — Anesthesia Postprocedure Evaluation (Signed)
Anesthesia Post Note  Patient: [name]  Procedure(s) Performed: Bilateral lipofilling breasts for improved contour (Bilateral: Breast) CYST REMOVAL (Right: Abdomen)     Patient location during evaluation: PACU Anesthesia Type: General Level of consciousness: awake and alert Pain management: pain level controlled Vital Signs Assessment: post-procedure vital signs reviewed and stable Respiratory status: spontaneous breathing, nonlabored ventilation and respiratory function stable Cardiovascular status: blood pressure returned to baseline and stable Postop Assessment: no apparent nausea or vomiting Anesthetic complications: no   No notable events documented.  Last Vitals:  Vitals:   07/08/21 1245 07/08/21 1250  BP: 114/78 122/80  Pulse: 76   Resp: 18   Temp:  36.6 C  SpO2: 98% 97%    Last Pain:  Vitals:   07/08/21 1250  TempSrc:   PainSc: 3                  Lynda Rainwater

## 2021-07-08 NOTE — Anesthesia Procedure Notes (Signed)
Procedure Name: LMA Insertion Date/Time: 07/08/2021 10:40 AM Performed by: Tawni Millers, CRNA Pre-anesthesia Checklist: Patient identified, Emergency Drugs available, Suction available and Patient being monitored Patient Re-evaluated:Patient Re-evaluated prior to induction Oxygen Delivery Method: Circle system utilized Preoxygenation: Pre-oxygenation with 100% oxygen Induction Type: IV induction Ventilation: Mask ventilation without difficulty LMA: LMA inserted LMA Size: 4.0 Number of attempts: 1 Airway Equipment and Method: Bite block Placement Confirmation: positive ETCO2 Tube secured with: Tape Dental Injury: Teeth and Oropharynx as per pre-operative assessment

## 2021-07-13 ENCOUNTER — Encounter (HOSPITAL_BASED_OUTPATIENT_CLINIC_OR_DEPARTMENT_OTHER): Payer: Self-pay | Admitting: Plastic Surgery

## 2021-07-16 ENCOUNTER — Telehealth: Payer: Self-pay | Admitting: *Deleted

## 2021-07-16 ENCOUNTER — Encounter: Payer: Self-pay | Admitting: Plastic Surgery

## 2021-07-16 ENCOUNTER — Other Ambulatory Visit: Payer: Self-pay

## 2021-07-16 ENCOUNTER — Ambulatory Visit (INDEPENDENT_AMBULATORY_CARE_PROVIDER_SITE_OTHER): Payer: Commercial Managed Care - PPO | Admitting: Plastic Surgery

## 2021-07-16 DIAGNOSIS — N6489 Other specified disorders of breast: Secondary | ICD-10-CM

## 2021-07-16 DIAGNOSIS — Z170 Estrogen receptor positive status [ER+]: Secondary | ICD-10-CM

## 2021-07-16 DIAGNOSIS — C50411 Malignant neoplasm of upper-outer quadrant of right female breast: Secondary | ICD-10-CM

## 2021-07-16 NOTE — Progress Notes (Signed)
   Subjective:    Patient ID: [name], female    DOB: 1980/10/21, 40 y.o.   MRN: [id number]  The patient is a 40 year old female here for follow-up after undergoing surgery on her breast.  Overall she is doing really well.  She had fat filling of both breasts.  She has better contour.  Her stomach is looking really good.  She has a little bit of swelling and bruising as expected.  It will take approximately 3 months to see how much fat survives.  No sign of infection.     Review of Systems  Constitutional: Negative.   Eyes: Negative.   Respiratory: Negative.    Cardiovascular: Negative.   Gastrointestinal: Negative.   Endocrine: Negative.   Genitourinary: Negative.       Objective:   Physical Exam Vitals and nursing note reviewed.  Constitutional:      Appearance: Normal appearance.  Cardiovascular:     Rate and Rhythm: Normal rate.     Pulses: Normal pulses.  Pulmonary:     Effort: Pulmonary effort is normal.  Neurological:     Mental Status: She is alert. Mental status is at baseline.       Assessment & Plan:     ICD-10-CM   1. Malignant neoplasm of upper-outer quadrant of right breast in female, estrogen receptor positive ([place])  C50.411    Z17.0     2. Breast asymmetry  N64.89        Pictures were obtained of the patient and placed in the chart with the patient's or guardian's permission.  She can go into a sports bra and spanks.  I would recommend continuing with the Lipo foam for another week.

## 2021-07-16 NOTE — Telephone Encounter (Signed)
I confirmed that pt has pain clinic appt and pt was told the date and time by [facility]

## 2021-07-26 ENCOUNTER — Other Ambulatory Visit: Payer: Self-pay | Admitting: Nurse Practitioner

## 2021-07-27 ENCOUNTER — Other Ambulatory Visit: Payer: Self-pay | Admitting: *Deleted

## 2021-07-27 MED ORDER — ALPRAZOLAM 0.5 MG PO TABS: 0.5000 mg | ORAL_TABLET | Freq: Three times a day (TID) | ORAL | 0 refills | Status: AC | PRN

## 2021-07-27 NOTE — Telephone Encounter (Signed)
Ok to refill. Will address in December when I see her

## 2021-07-29 ENCOUNTER — Encounter: Payer: Self-pay | Admitting: Oncology

## 2021-08-03 ENCOUNTER — Encounter: Payer: Commercial Managed Care - PPO | Admitting: Physician Assistant

## 2021-08-03 NOTE — Progress Notes (Signed)
Patient is a 40 year old female with PMH of breast cancer s/p reconstruction 12/2020 and recently bilateral Lipo filling for improved contour performed 07/08/2021 by Dr. Marla Roe who presents to clinic for postop evaluation.  Patient was last seen here in clinic on 07/16/2021.  At that time, her contour appeared improved.  She had swelling and bruising involving her abdomen, as anticipated given site of liposuction.  Plan was for transition into sports bra and spanks and continued Lipo foam for another week.  Today, she complains of drainage from site of the sebaceous cyst excision.  She states that she has been applying Band-Aids over it to help with drainage.  She also expressed that she would like to return in a few months for consult for repeat Lipo filling given that she has persistent depressed region over her left medial breast.  She has mild generalized abdominal "soreness" from her liposuction, but denies any severe pain symptoms or swelling.  On physical exam, her 1 cm sebaceous cyst excision site has small wound, pinpoint hole with no significant drainage on my exam.  She has a reddish outline of a Band-Aid, likely adhesive dermatitis, but no redness or induration immediately along the excision site/wound.  No cellulitic findings.  Abdomen without any significant ecchymoses.  Breasts appear healthy, no redness or tenderness to palpation on exam.  She does have mild depression over medial aspect left breast that would likely benefit from additional Lipo filling.  No other significant derangement in contour and symmetry.  Overall they look good.  She states that she was initially scheduled for nipple areolar tattooing 08/16/2021, but it was canceled due to that provider leaving the practice.  She still expressed continued interest of having it performed by [place] of [place] here in clinic when the time is right.  Plan is for her to follow-up here in clinic in 2 to 3 months for consult for Lipo filling as well  as for nipple areolar tattooing.  In the interim, recommending continued gauze to the small wound followed by paper tape.  No need for antibiotics.  She can call the clinic should she develop any new questions or concerns.

## 2021-08-06 ENCOUNTER — Ambulatory Visit (INDEPENDENT_AMBULATORY_CARE_PROVIDER_SITE_OTHER): Payer: Commercial Managed Care - PPO | Admitting: Physician Assistant

## 2021-08-06 ENCOUNTER — Other Ambulatory Visit: Payer: Self-pay

## 2021-08-06 DIAGNOSIS — N6489 Other specified disorders of breast: Secondary | ICD-10-CM

## 2021-08-06 DIAGNOSIS — M722 Plantar fascial fibromatosis: Secondary | ICD-10-CM | POA: Insufficient documentation

## 2021-08-06 DIAGNOSIS — M2392 Unspecified internal derangement of left knee: Secondary | ICD-10-CM | POA: Insufficient documentation

## 2021-08-11 ENCOUNTER — Encounter (INDEPENDENT_AMBULATORY_CARE_PROVIDER_SITE_OTHER): Payer: Self-pay

## 2021-08-12 ENCOUNTER — Other Ambulatory Visit: Payer: Self-pay | Admitting: Oncology

## 2021-08-16 ENCOUNTER — Ambulatory Visit: Payer: Commercial Managed Care - PPO

## 2021-08-22 IMAGING — US US ABDOMEN LIMITED
1 series · 13 of 25 positions shown · non-contrast
Comparison: CT abdomen/pelvis 01/02/2020, abdominal ultrasound
01/11/2012.

CLINICAL DATA: Biliary colic. Additional history provided by
scanning technologist: Patient reports right-sided pain for 2 weeks.

EXAM:
ULTRASOUND ABDOMEN LIMITED RIGHT UPPER QUADRANT

[Series 1: us abdomen limited · 0.23mm/px · 13 of 49 slices shown]
[im 1/49]
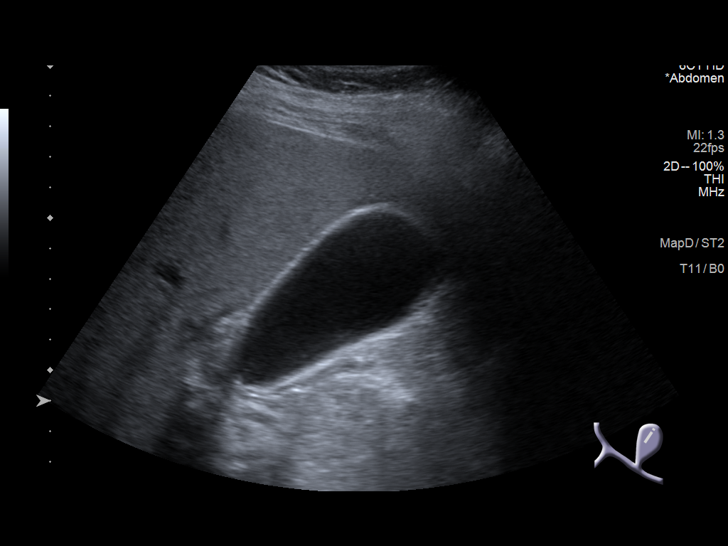
[im 5/49]
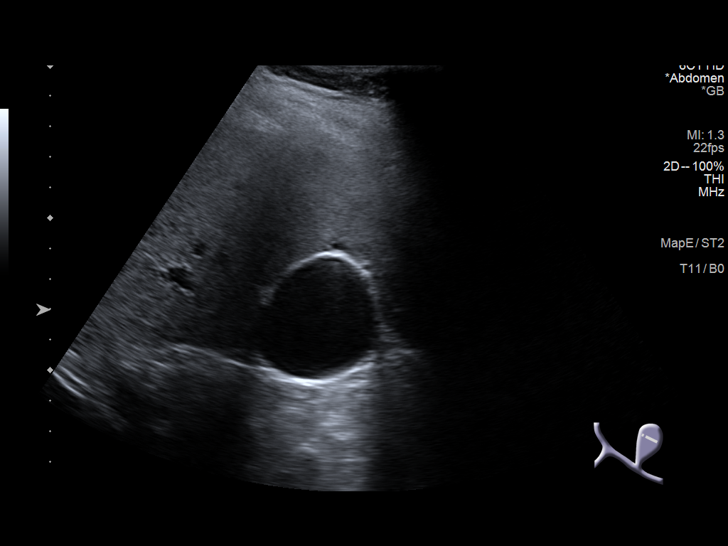
[im 9/49]
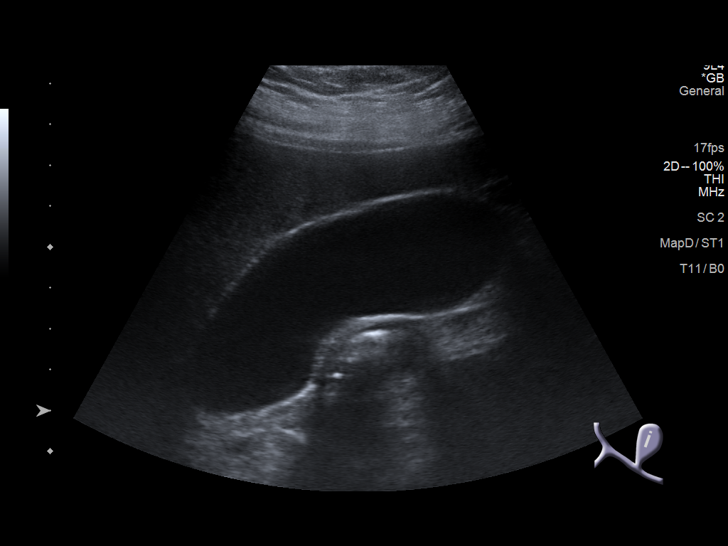
[im 13/49]
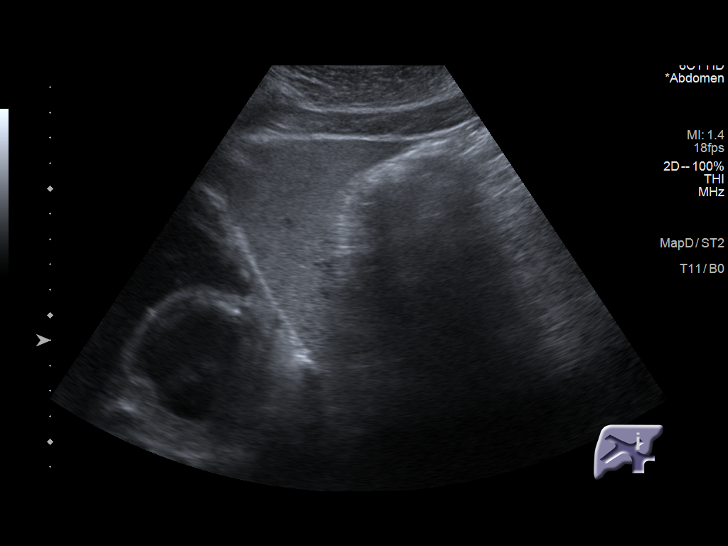
[im 17/49]
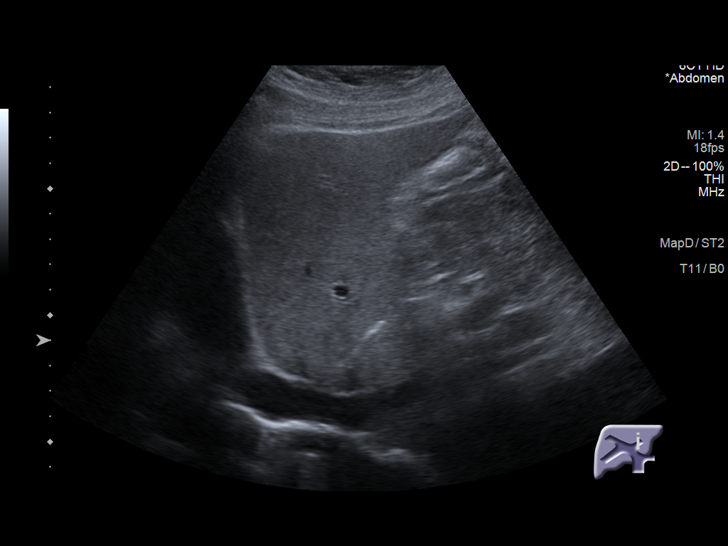
[im 21/49]
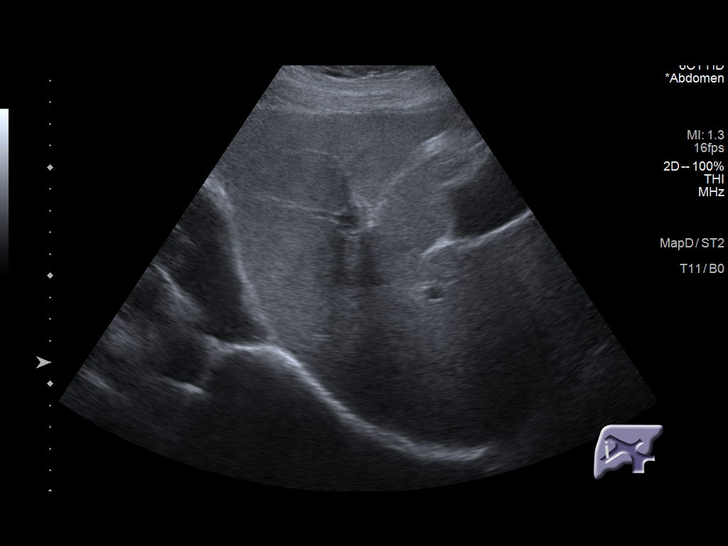
[im 25/49]
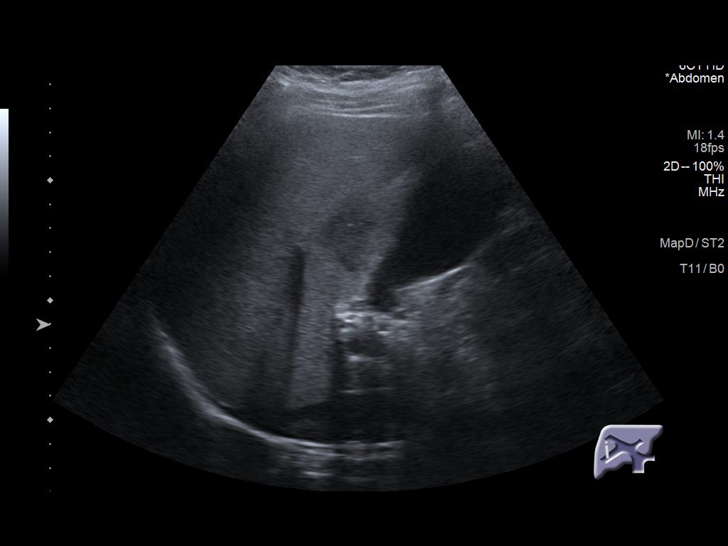
[im 29/49]
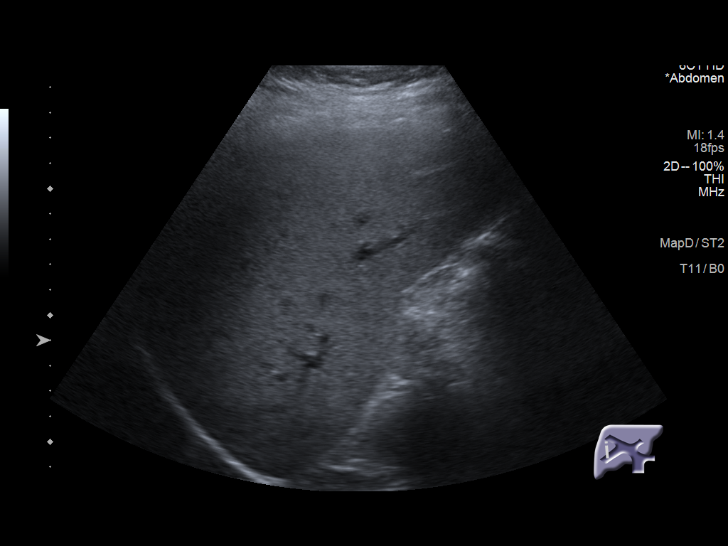
[im 33/49]
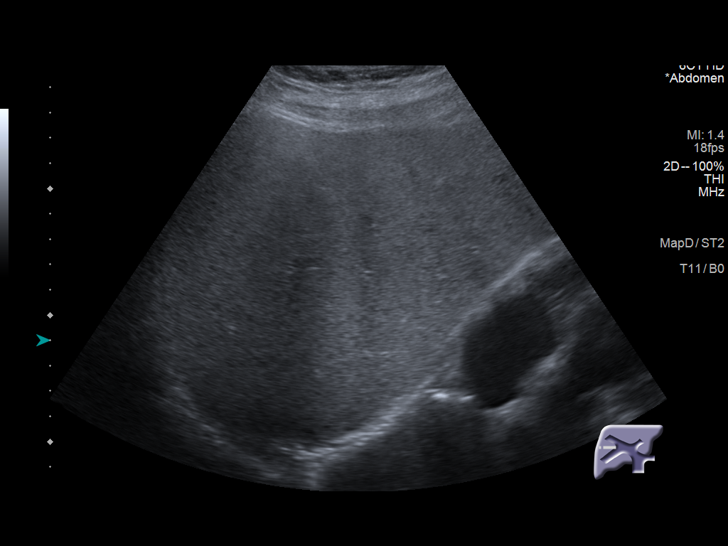
[im 37/49]
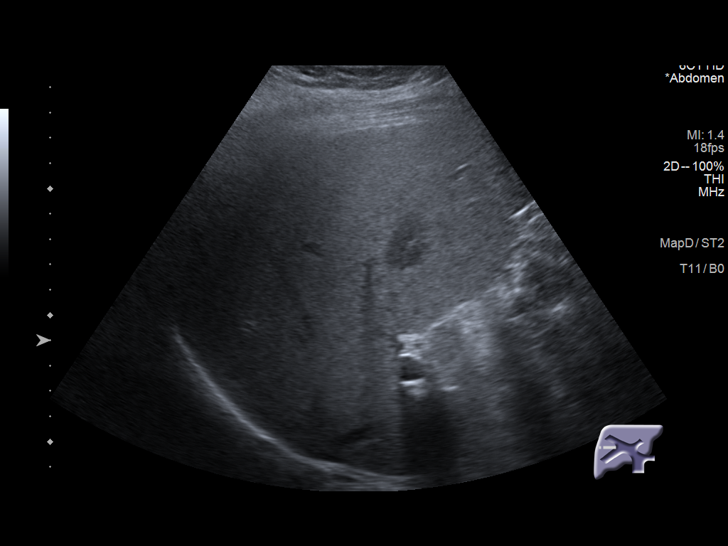
[im 41/49]
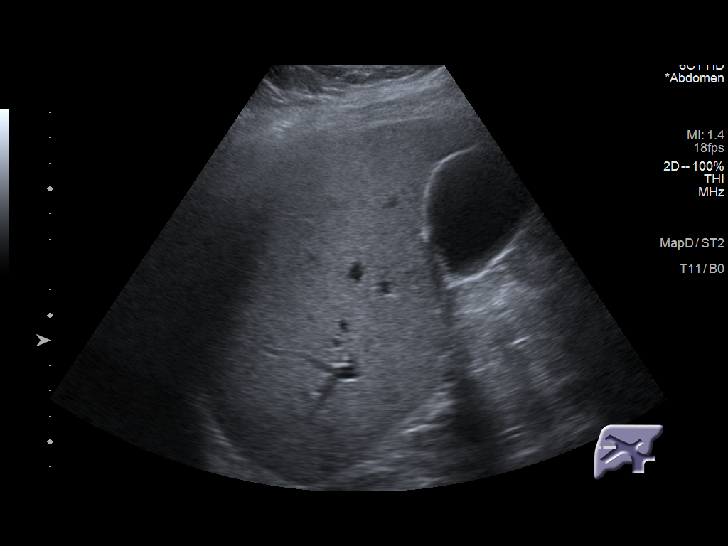
[im 45/49]
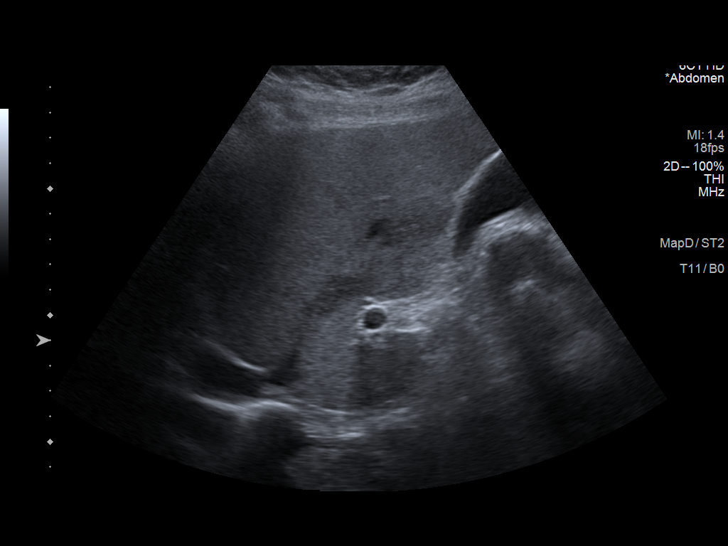
[im 49/49]
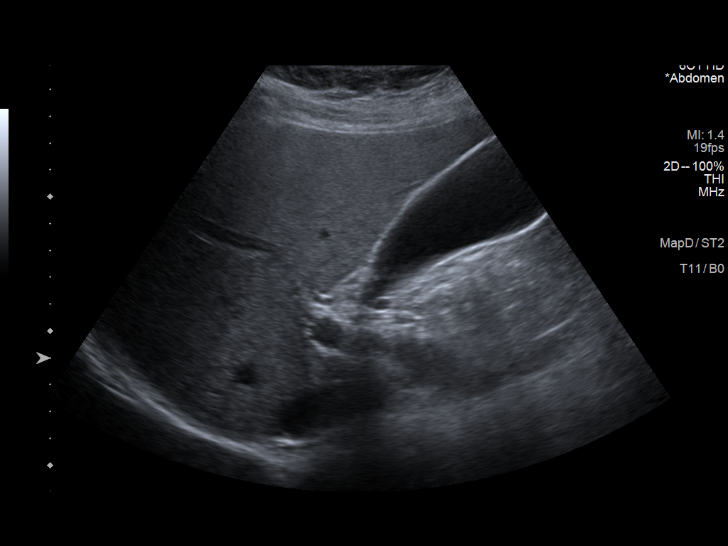

[13 of 25 positions shown; findings below may reference images not displayed]

FINDINGS: Gallbladder:

Sludge and questionable tiny gallstones within the gallbladder.
There is no appreciable gallbladder wall thickening. No sonographic
Murphy sign noted by sonographer.

Common bile duct:

Diameter: 4 mm, within normal limits.

Liver:

Diffusely increased hepatic parenchymal echogenicity. There are
several hypoechoic foci within the right hepatic lobe. Portal vein
is patent on color Doppler imaging with normal direction of blood
flow towards the liver.
IMPRESSION: Sludge and questionable tiny gallstones within the gallbladder. No
sonographic evidence of acute cholecystitis. The common duct is
normal in caliber.

There are several hypoechoic foci within the right hepatic lobe.
Primary differential considerations include liver masses versus
areas of focal fatty sparing. Contrast-enhanced abdominal MRI is
recommended for further evaluation.

Generalized hyperechogenicity of the hepatic parenchyma. This is a
nonspecific finding, which may be seen in the setting of hepatic
steatosis or other chronic hepatic parenchymal disease.

## 2021-08-30 ENCOUNTER — Other Ambulatory Visit: Payer: Self-pay

## 2021-08-30 ENCOUNTER — Inpatient Hospital Stay: Payer: Commercial Managed Care - PPO | Attending: Oncology

## 2021-08-30 ENCOUNTER — Ambulatory Visit: Payer: Commercial Managed Care - PPO

## 2021-08-30 DIAGNOSIS — Z5111 Encounter for antineoplastic chemotherapy: Secondary | ICD-10-CM | POA: Diagnosis present

## 2021-08-30 DIAGNOSIS — C50411 Malignant neoplasm of upper-outer quadrant of right female breast: Secondary | ICD-10-CM | POA: Insufficient documentation

## 2021-08-30 DIAGNOSIS — Z170 Estrogen receptor positive status [ER+]: Secondary | ICD-10-CM | POA: Insufficient documentation

## 2021-08-30 MED ORDER — LEUPROLIDE ACETATE (3 MONTH) 11.25 MG IM KIT
11.2500 mg | PACK | INTRAMUSCULAR | Status: DC
  Administered 2021-08-30: 11.25 mg via INTRAMUSCULAR
  Filled 2021-08-30: qty 11.25

## 2021-09-01 ENCOUNTER — Ambulatory Visit: Payer: Commercial Managed Care - PPO

## 2021-09-04 ENCOUNTER — Other Ambulatory Visit: Payer: Self-pay | Admitting: Oncology

## 2021-09-07 ENCOUNTER — Encounter: Payer: Commercial Managed Care - PPO | Admitting: Plastic Surgery

## 2021-09-07 DIAGNOSIS — G43009 Migraine without aura, not intractable, without status migrainosus: Secondary | ICD-10-CM | POA: Insufficient documentation

## 2021-09-08 ENCOUNTER — Encounter: Payer: Self-pay | Admitting: Oncology

## 2021-09-14 IMAGING — US US BREAST*R* LIMITED INC AXILLA
1 series · 12 of 12 positions shown · non-contrast
Comparison: Previous exam(s).

CLINICAL DATA: 38-year-old female presenting for evaluation of a
palpable lump in the right breast which she has noticed for about a
year. She had an abdominal MRI in Saturday February, 2020 showing some
enhancement in the right breast. She has family history of breast
cancer in a maternal aunt diagnosed at about age 42.

EXAM:
DIGITAL DIAGNOSTIC BILATERAL MAMMOGRAM WITH CAD AND TOMO
ULTRASOUND RIGHT BREAST

[Series 1: us breast*right* limited inc axilla · 0.06mm/px · 12 of 12 slices shown]
[im 1/12]
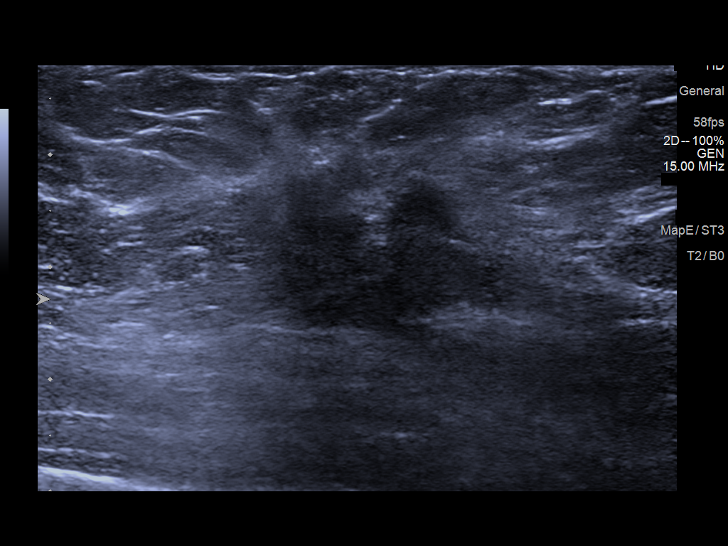
[im 2/12]
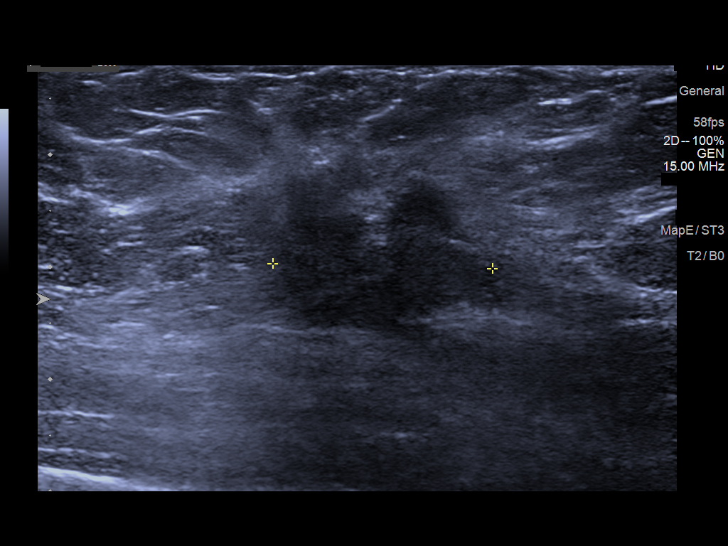
[im 3/12]
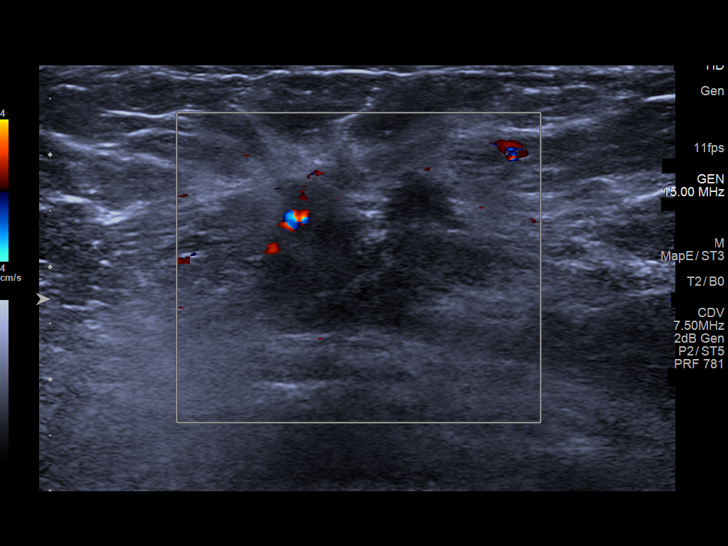
[im 4/12]
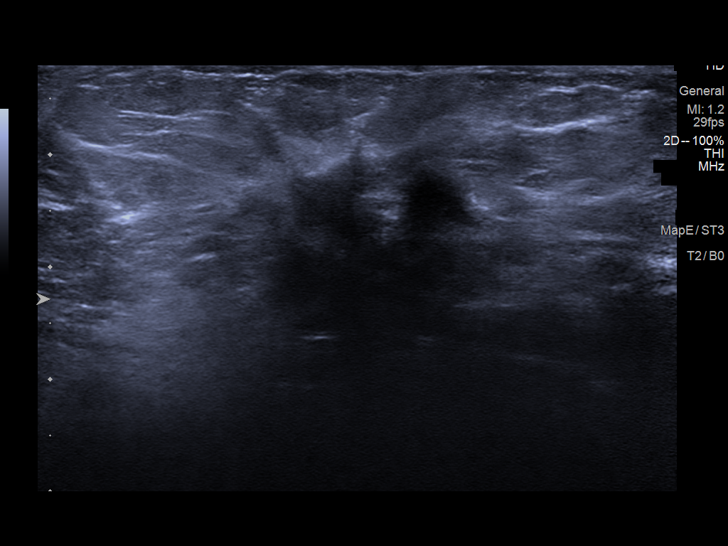
[im 5/12]
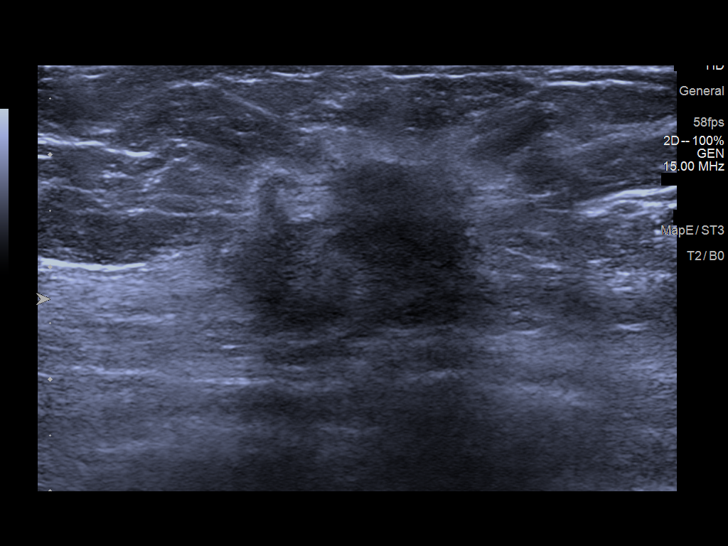
[im 6/12]
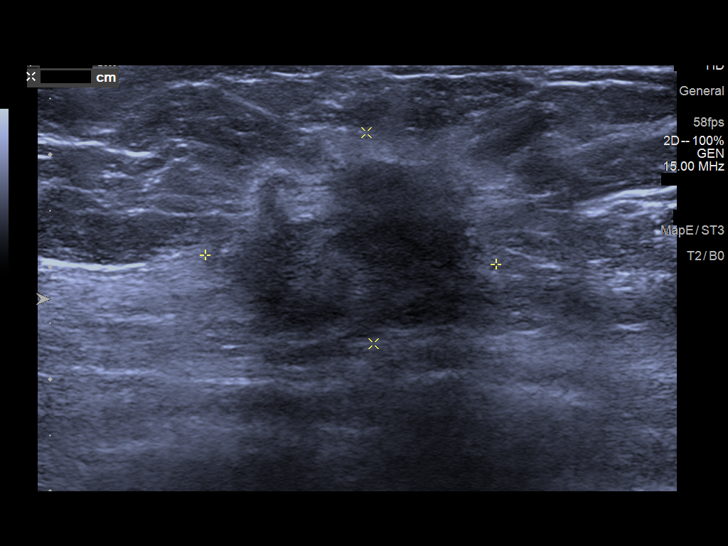
[im 7/12]
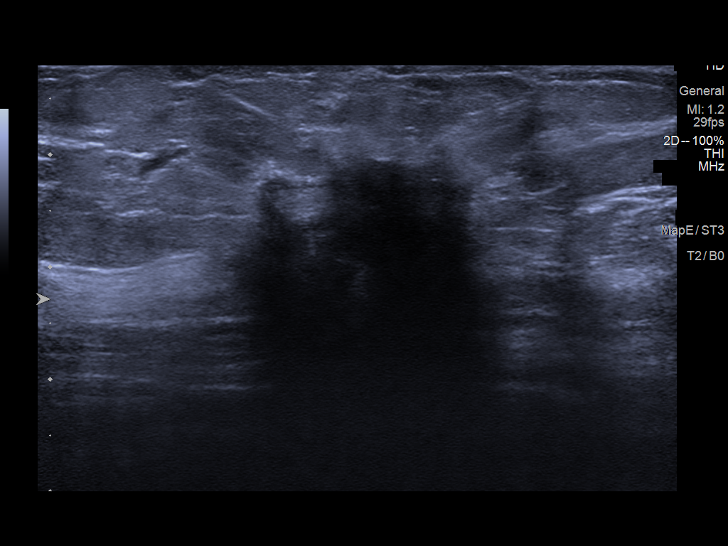
[im 8/12]
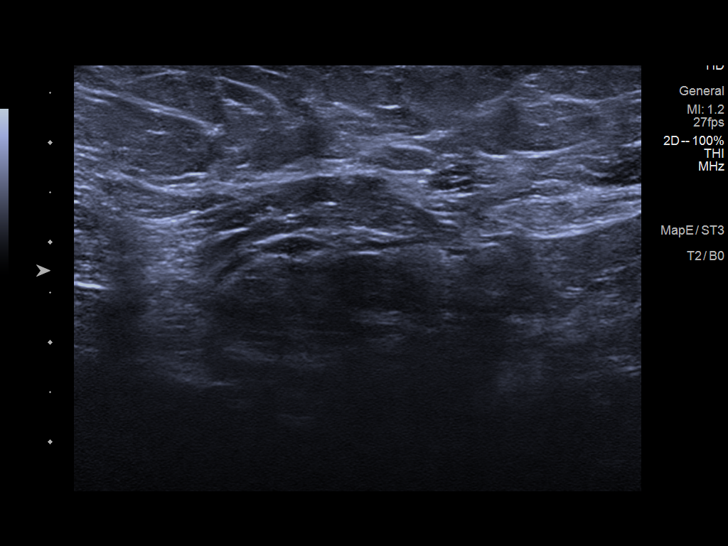
[im 9/12]
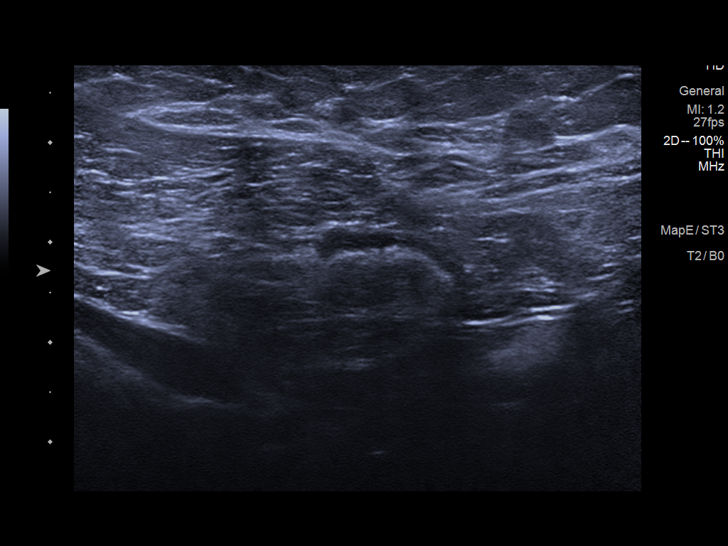
[im 10/12]
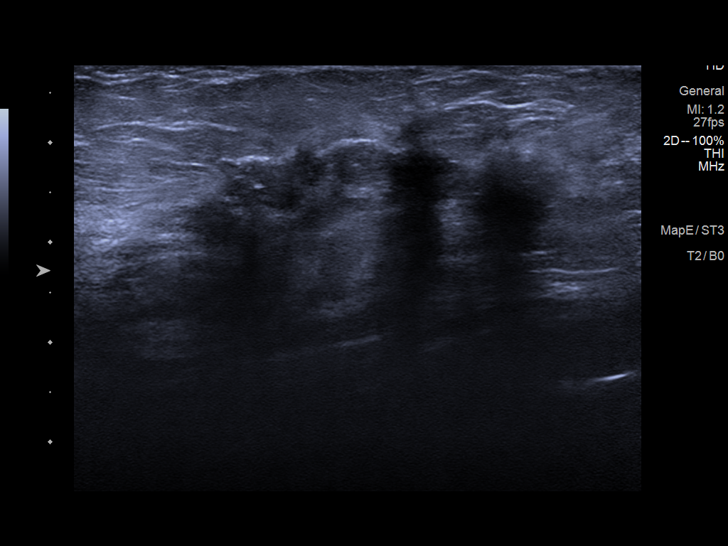
[im 11/12]
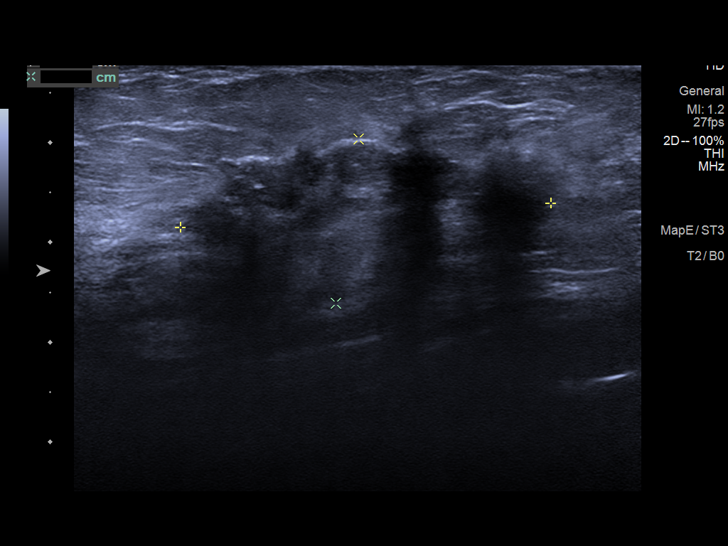
[im 12/12]
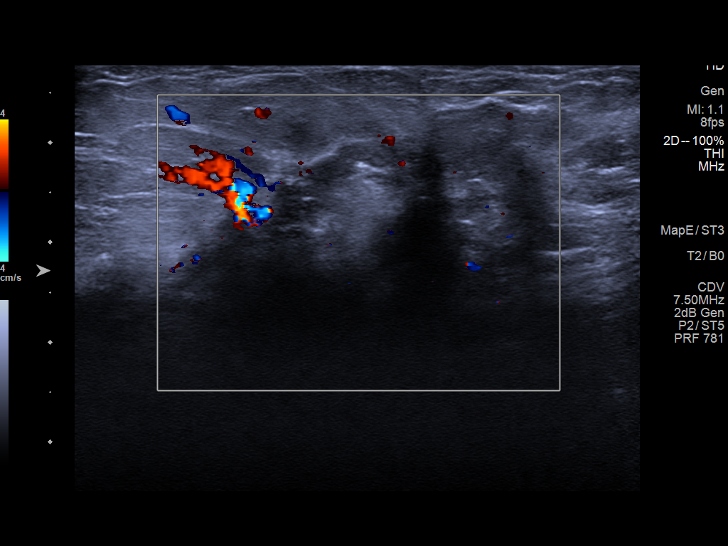

[12 of 12 positions shown; findings below may reference images not displayed]

ACR Breast Density Category c: The breast tissue is heterogeneously
dense, which may obscure small masses.
FINDINGS: Spot compression tomosynthesis images over the palpable site of
concern in the lateral posterior right breast demonstrates a broad
area of distortion with probable underlying associated masses. The
area spans approximately 3.8 cm mammographically. No other
suspicious calcifications, masses or areas of distortion are seen in
the bilateral breasts.

Mammographic images were processed with CAD.

On physical exam, there is an easily palpable firm protruding mass
in the lateral aspect of the right breast.

Ultrasound targeted to the right breast at 9 o'clock, 6 cm from the
nipple demonstrates a heterogeneous ill-defined irregular shadowing
mass measuring 3.7 x 1.7 x 2.0 cm. Ultrasound of the right axilla
demonstrates multiple normal-appearing lymph nodes.
IMPRESSION: 1. There is a highly suspicious mass in the lateral right breast
measuring approximately 3.7 cm.

2.  No evidence of right axillary lymphadenopathy.

3.  No evidence of left breast malignancy.

RECOMMENDATION:
1. Ultrasound-guided biopsy is recommended for the right breast mass
at 9 o'clock.

2. Presuming that the results demonstrate malignancy, consider
breast MRI to determine extent of disease given the density of the
patient's breast tissue, age and ill-defined appearance of the mass.

I have discussed the findings and recommendations with the patient.
If applicable, a reminder letter will be sent to the patient
regarding the next appointment.

BI-RADS CATEGORY  5: Highly suggestive of malignancy.

## 2021-09-17 ENCOUNTER — Encounter: Payer: Commercial Managed Care - PPO | Admitting: Physician Assistant

## 2021-09-19 IMAGING — MG US  BREAST BX W/ LOC DEV 1ST LESION IMG BX SPEC US GUIDE*R*
1 series · 8 of 8 positions shown · non-contrast
Comparison: Previous exam(s).
COMPARISON: Previous exam(s).

Addendum:
CLINICAL DATA: 30-year-old female with a suspicious palpable mass
in the right breast at the approximate 9 o'clock position.

EXAM:
ULTRASOUND GUIDED RIGHT BREAST CORE NEEDLE BIOPSY

[Series 1: MG view · 0.06mm/px · 8 of 14 slices shown]
[im 1/14]
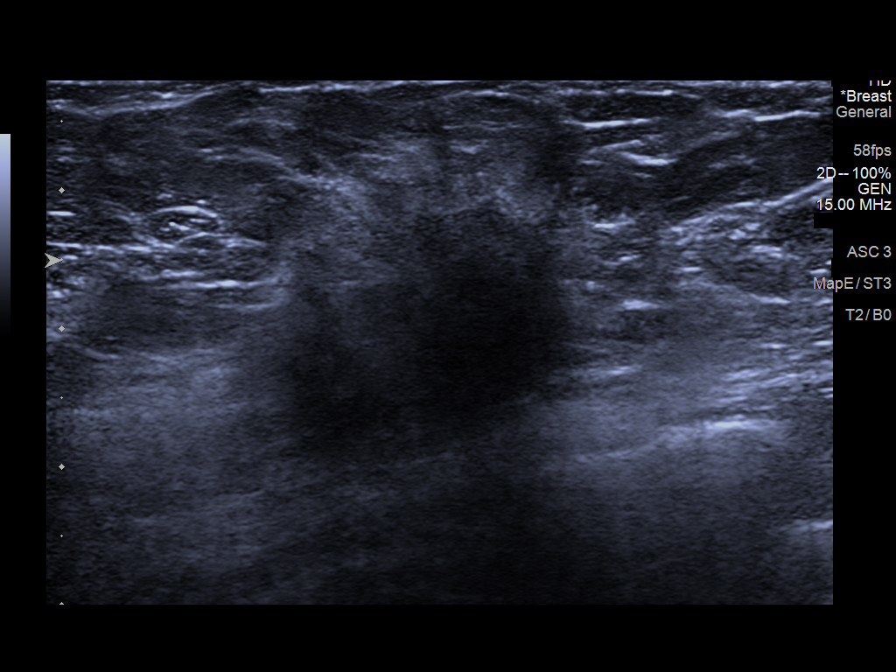
[im 2/14]
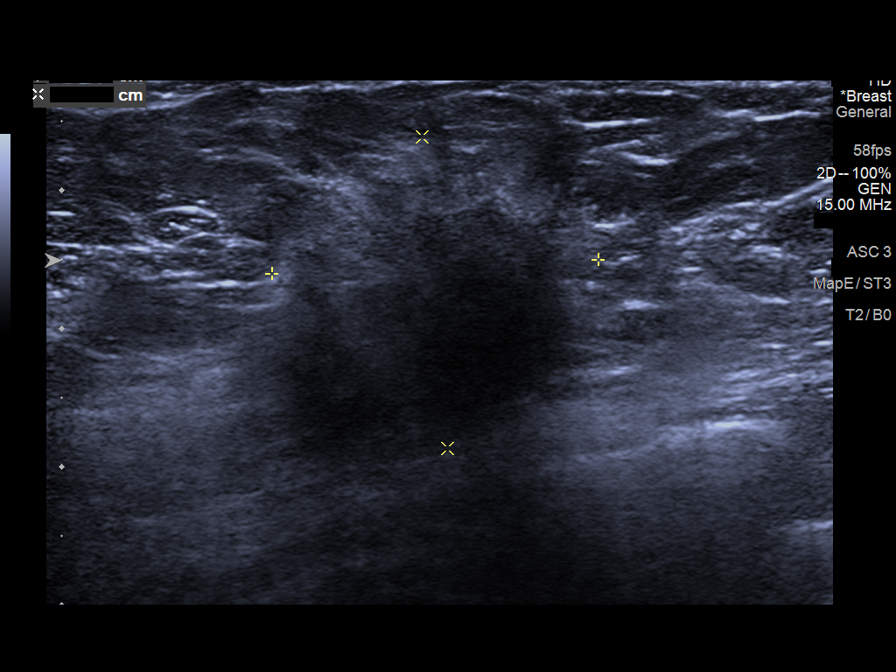
[im 4/14]
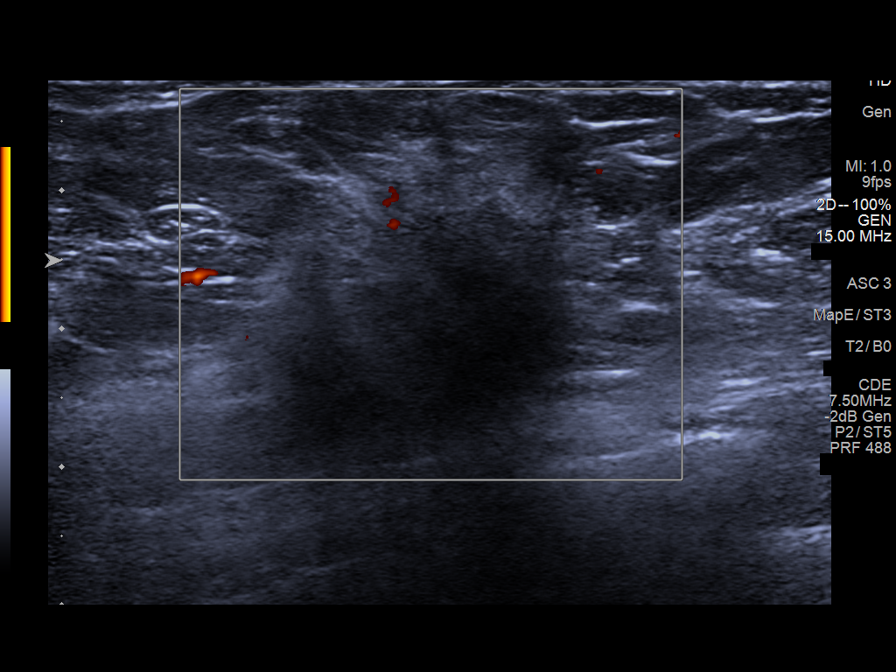
[im 6/14]
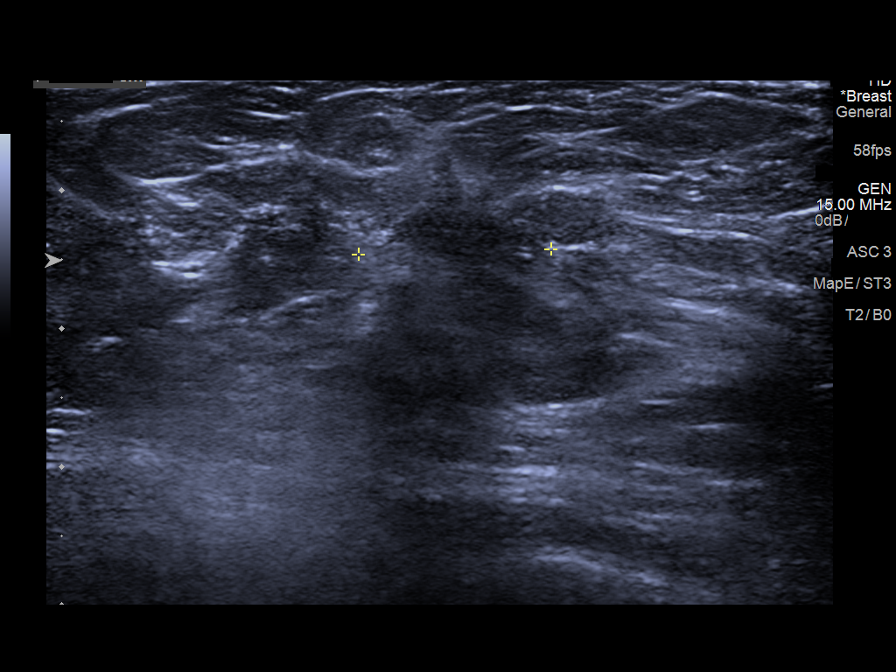
[im 8/14]
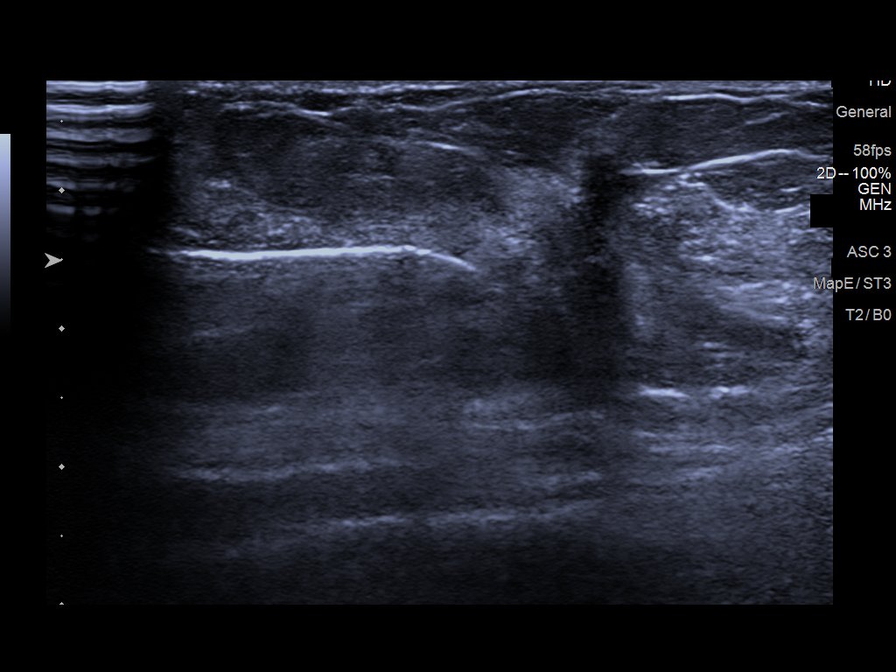
[im 10/14]
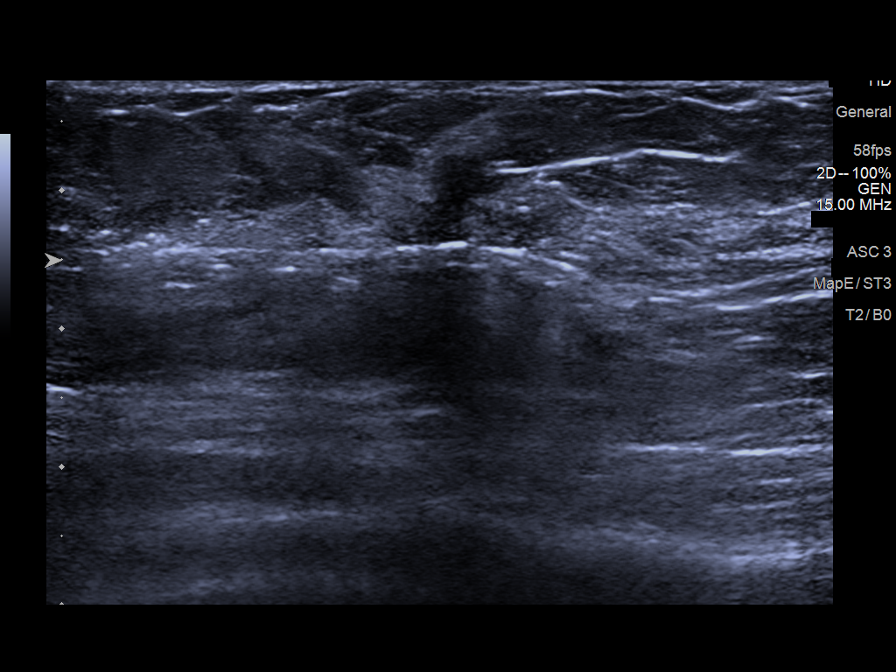
[im 12/14]
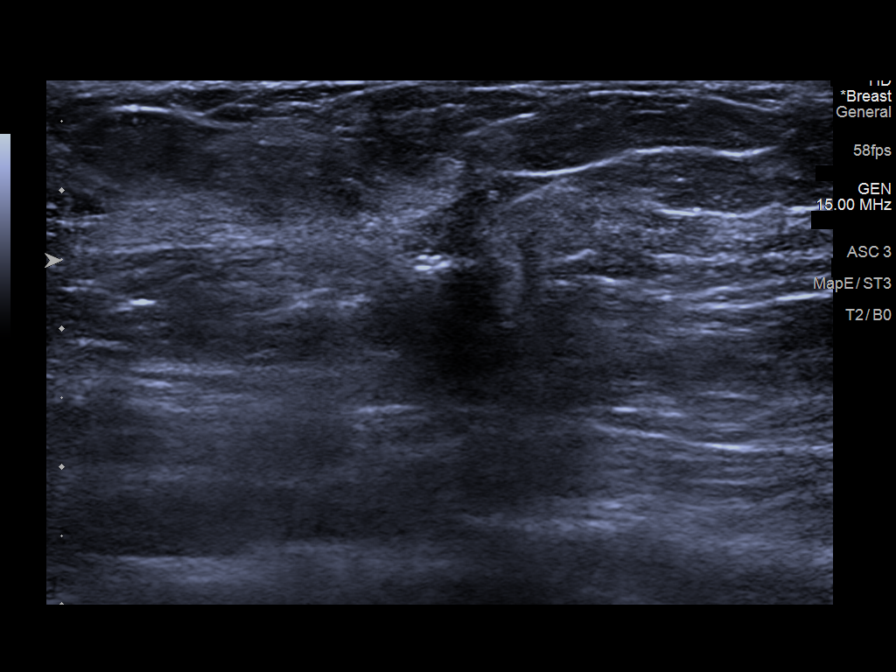
[im 14/14]
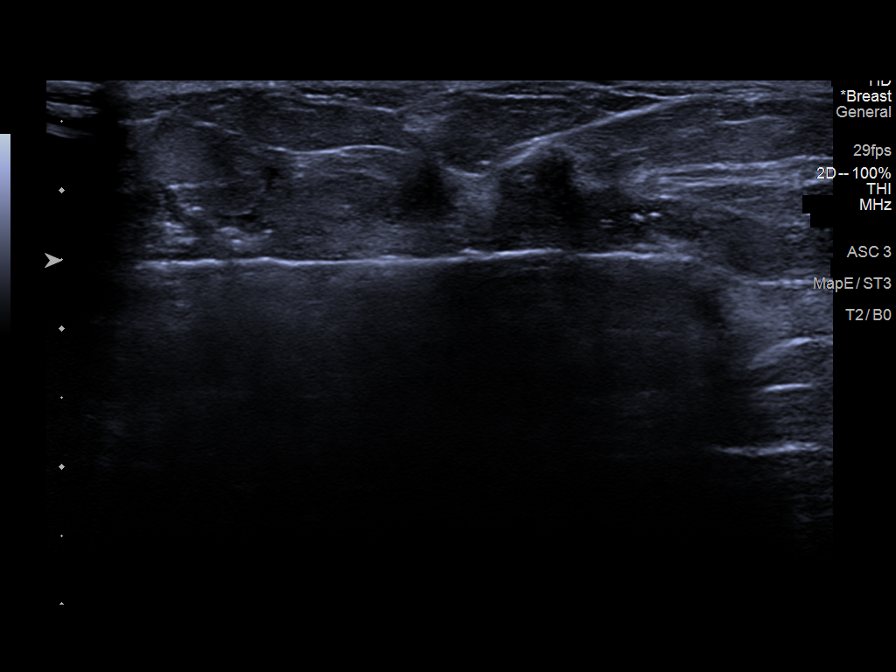

[8 of 8 positions shown; findings below may reference images not displayed]



Lesion quadrant: Upper-outer

Using sterile technique and 1% Lidocaine as local anesthetic, under
direct ultrasound visualization, a 14 gauge Kezia device was
used to perform biopsy of the heterogeneous ill-defined mass in the
right breast at the 9 o'clock position using a lateral to medial
approach. At the conclusion of the procedure wing shaped tissue
marker clip was deployed into the biopsy cavity. Follow up 2 view
mammogram was performed and dictated separately.
IMPRESSION: Ultrasound guided biopsy of the mass in the right breast at the 9
o'clock position. No apparent complications.

ADDENDUM:
PATHOLOGY revealed: A. BREAST, RIGHT AT [DATE], 6 CM FROM THE NIPPLE;
ULTRASOUND-GUIDED CORE NEEDLE BIOPSY: - INVASIVE MAMMARY CARCINOMA,
NO SPECIAL TYPE. At least 11 mm in this sample. Grade 2. Ductal
carcinoma in situ: Not identified. Lymphovascular invasion: Not
identified. Final grading will be assessed on the final resection.

Pathology results are CONCORDANT with imaging findings, per Dr.
Vefxo Kranhold.

Pathology results and recommendations below were discussed with
patient by telephone on 04/01/2020. Patient reported biopsy site
doing well with slight tenderness at the site. Post biopsy care
instructions were reviewed and questions were answered. Patient was
instructed to call [HOSPITAL] if any concerns or
questions arise related to the biopsy.

Recommendation:

1. Surgical referral. Request for surgical referral was relayed to
Parkourboys Nodapera RN and Magy Hey RN at [HOSPITAL] [REDACTED] by Patnam Tent RN on [DATE]/ 6461.

2. Recommend bilateral breast MRI given patient's age, dense breast
tissue and ill defined appearance of mass.

Addendum by Patnam Tent RN on 04/02/2020.



Lesion quadrant: Upper-outer

Using sterile technique and 1% Lidocaine as local anesthetic, under
direct ultrasound visualization, a 14 gauge Kezia device was
used to perform biopsy of the heterogeneous ill-defined mass in the
right breast at the 9 o'clock position using a lateral to medial
approach. At the conclusion of the procedure wing shaped tissue
marker clip was deployed into the biopsy cavity. Follow up 2 view
mammogram was performed and dictated separately.
IMPRESSION: Ultrasound guided biopsy of the mass in the right breast at the 9
o'clock position. No apparent complications.

## 2021-10-06 ENCOUNTER — Ambulatory Visit (INDEPENDENT_AMBULATORY_CARE_PROVIDER_SITE_OTHER): Payer: Commercial Managed Care - PPO | Admitting: Surgical

## 2021-10-06 ENCOUNTER — Other Ambulatory Visit: Payer: Self-pay

## 2021-10-06 DIAGNOSIS — Z170 Estrogen receptor positive status [ER+]: Secondary | ICD-10-CM

## 2021-10-06 DIAGNOSIS — N6489 Other specified disorders of breast: Secondary | ICD-10-CM

## 2021-10-06 DIAGNOSIS — C50411 Malignant neoplasm of upper-outer quadrant of right female breast: Secondary | ICD-10-CM

## 2021-10-06 DIAGNOSIS — Z9013 Acquired absence of bilateral breasts and nipples: Secondary | ICD-10-CM

## 2021-10-06 NOTE — Progress Notes (Signed)
40 year old female here for follow-up status post bilateral breast reconstruction in March 2022.  She subsequently underwent bilateral breast Lipo filling for improved symmetry and contour on 07/08/2021 with Dr. Marla Roe.  She reports that overall she is doing well.  She reports that she previously discussed with Dr. Marla Roe the possibility of needing additional Lipo filling of left breast for continued contour abnormality.  She is bothered by the appearance of the left medial breast and left central breast where she has some volume loss, especially volume loss worsened with animation.  She reports some tenderness in the right lateral breast.  Chaperone present on exam On exam bilateral breast incisions are intact and healing well.  No subcutaneous fluid collections noted with palpation.  Overall she has good shape and symmetry, some slight volume loss noted along the medial left breast and the central left breast.  The volume loss medially is worsened with animation.  In the right lateral breast she has some tenderness just lateral to the implant, feels consistent with some scarring.  No distinct masses noted with palpation.  There is no overlying skin changes.  Plan: 41 year old female status post bilateral breast reconstruction as well as bilateral breast Lipo filling.  She is interested in additional Lipo filling of left breast for improved shape and contour.  After additional lipophilic she is still interested in nipple areolar tattooing, we will further discuss this at subsequent appointments.  She would like to have surgery prior to April.  I will route patient's note to surgical scheduling team and Dr. Marla Roe to plan for additional fat grafting to left breast.  Discussed with patient we may need to schedule an additional follow-up with Dr. Marla Roe as well to further discuss.   Recommend continuing to monitor right lateral breast tenderness, she is seeing oncology October 20, 2020 and  would like to discuss this with them as well.  Depending on improvement in the area or any changes we may consider ultrasound for further evaluation.  Pictures were taken and placed in the patient's chart with patient's permission.

## 2021-10-13 ENCOUNTER — Ambulatory Visit: Payer: Commercial Managed Care - PPO | Admitting: Oncology

## 2021-10-13 ENCOUNTER — Ambulatory Visit: Payer: Commercial Managed Care - PPO

## 2021-10-13 ENCOUNTER — Other Ambulatory Visit: Payer: Commercial Managed Care - PPO

## 2021-10-20 ENCOUNTER — Other Ambulatory Visit: Payer: Self-pay

## 2021-10-20 ENCOUNTER — Encounter: Payer: Self-pay | Admitting: Oncology

## 2021-10-20 ENCOUNTER — Inpatient Hospital Stay: Payer: Commercial Managed Care - PPO

## 2021-10-20 ENCOUNTER — Inpatient Hospital Stay: Payer: Commercial Managed Care - PPO | Attending: Oncology

## 2021-10-20 ENCOUNTER — Inpatient Hospital Stay (HOSPITAL_BASED_OUTPATIENT_CLINIC_OR_DEPARTMENT_OTHER): Payer: Commercial Managed Care - PPO | Admitting: Oncology

## 2021-10-20 VITALS — BP 126/77 | HR 98 | Temp 99.0°F | Resp 18 | Wt 210.0 lb

## 2021-10-20 DIAGNOSIS — Z170 Estrogen receptor positive status [ER+]: Secondary | ICD-10-CM

## 2021-10-20 DIAGNOSIS — Z853 Personal history of malignant neoplasm of breast: Secondary | ICD-10-CM | POA: Diagnosis not present

## 2021-10-20 DIAGNOSIS — G620 Drug-induced polyneuropathy: Secondary | ICD-10-CM | POA: Diagnosis not present

## 2021-10-20 DIAGNOSIS — Z5181 Encounter for therapeutic drug level monitoring: Secondary | ICD-10-CM

## 2021-10-20 DIAGNOSIS — Z08 Encounter for follow-up examination after completed treatment for malignant neoplasm: Secondary | ICD-10-CM | POA: Diagnosis not present

## 2021-10-20 DIAGNOSIS — N644 Mastodynia: Secondary | ICD-10-CM

## 2021-10-20 DIAGNOSIS — Z9882 Breast implant status: Secondary | ICD-10-CM

## 2021-10-20 DIAGNOSIS — C50511 Malignant neoplasm of lower-outer quadrant of right female breast: Secondary | ICD-10-CM | POA: Insufficient documentation

## 2021-10-20 DIAGNOSIS — T451X5A Adverse effect of antineoplastic and immunosuppressive drugs, initial encounter: Secondary | ICD-10-CM

## 2021-10-20 DIAGNOSIS — Z79899 Other long term (current) drug therapy: Secondary | ICD-10-CM | POA: Diagnosis not present

## 2021-10-20 DIAGNOSIS — Z79811 Long term (current) use of aromatase inhibitors: Secondary | ICD-10-CM | POA: Insufficient documentation

## 2021-10-20 DIAGNOSIS — Z7983 Long term (current) use of bisphosphonates: Secondary | ICD-10-CM

## 2021-10-20 LAB — CBC WITH DIFFERENTIAL/PLATELET
Abs Immature Granulocytes: 0.01 10*3/uL (ref 0.00–0.07)
Basophils Absolute: 0 10*3/uL (ref 0.0–0.1)
Basophils Relative: 0 %
Eosinophils Absolute: 0.1 10*3/uL (ref 0.0–0.5)
Eosinophils Relative: 1 %
HCT: 41 % (ref 36.0–46.0)
Hemoglobin: 14.1 g/dL (ref 12.0–15.0)
Immature Granulocytes: 0 %
Lymphocytes Relative: 39 %
Lymphs Abs: 2.4 10*3/uL (ref 0.7–4.0)
MCH: 29.4 pg (ref 26.0–34.0)
MCHC: 34.4 g/dL (ref 30.0–36.0)
MCV: 85.6 fL (ref 80.0–100.0)
Monocytes Absolute: 0.4 10*3/uL (ref 0.1–1.0)
Monocytes Relative: 7 %
Neutro Abs: 3.3 10*3/uL (ref 1.7–7.7)
Neutrophils Relative %: 53 %
Platelets: 251 10*3/uL (ref 150–400)
RBC: 4.79 MIL/uL (ref 3.87–5.11)
RDW: 12.6 % (ref 11.5–15.5)
WBC: 6.2 10*3/uL (ref 4.0–10.5)
nRBC: 0 % (ref 0.0–0.2)

## 2021-10-20 LAB — COMPREHENSIVE METABOLIC PANEL
ALT: 27 U/L (ref 0–44)
AST: 36 U/L (ref 15–41)
Albumin: 4.4 g/dL (ref 3.5–5.0)
Alkaline Phosphatase: 69 U/L (ref 38–126)
Anion gap: 6 (ref 5–15)
BUN: 19 mg/dL (ref 6–20)
CO2: 27 mmol/L (ref 22–32)
Calcium: 9.7 mg/dL (ref 8.9–10.3)
Chloride: 104 mmol/L (ref 98–111)
Creatinine, Ser: 1.06 mg/dL — ABNORMAL HIGH (ref 0.44–1.00)
GFR, Estimated: >60 mL/min (ref >60)
Glucose, Bld: 126 mg/dL — ABNORMAL HIGH (ref 70–99)
Potassium: 3.6 mmol/L (ref 3.5–5.1)
Sodium: 137 mmol/L (ref 135–145)
Total Bilirubin: 0.5 mg/dL (ref 0.3–1.2)
Total Protein: 7.4 g/dL (ref 6.5–8.1)

## 2021-10-20 MED ORDER — ZOLEDRONIC ACID 4 MG/100ML IV SOLN
4.0000 mg | Freq: Once | INTRAVENOUS | Status: AC
Start: 2021-10-20 — End: 2021-10-20
  Administered 2021-10-20: 4 mg via INTRAVENOUS
  Filled 2021-10-20: qty 100

## 2021-10-20 MED ORDER — SODIUM CHLORIDE 0.9 % IV SOLN
INTRAVENOUS | Status: DC
Start: 2021-10-20 — End: 2021-10-20
  Filled 2021-10-20 (×2): qty 250

## 2021-10-20 NOTE — Progress Notes (Signed)
Patient here for oncology follow-up appointment, concerns of neuropathy and occasional SOB
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
## 2021-10-27 ENCOUNTER — Other Ambulatory Visit: Payer: Self-pay

## 2021-10-27 ENCOUNTER — Ambulatory Visit
Admission: RE | Admit: 2021-10-27 | Discharge: 2021-10-27 | Disposition: A | Discharge status: Home / Self Care | Payer: Commercial Managed Care - PPO | Source: Ambulatory Visit | Attending: Oncology | Admitting: Oncology

## 2021-10-27 DIAGNOSIS — Z170 Estrogen receptor positive status [ER+]: Secondary | ICD-10-CM | POA: Insufficient documentation

## 2021-10-27 DIAGNOSIS — C50411 Malignant neoplasm of upper-outer quadrant of right female breast: Secondary | ICD-10-CM | POA: Diagnosis not present

## 2021-10-27 DIAGNOSIS — N644 Mastodynia: Secondary | ICD-10-CM | POA: Insufficient documentation

## 2021-10-27 DIAGNOSIS — Z9882 Breast implant status: Secondary | ICD-10-CM | POA: Diagnosis present

## 2021-10-28 ENCOUNTER — Encounter: Payer: Self-pay | Admitting: Oncology

## 2021-11-02 ENCOUNTER — Encounter: Payer: Self-pay | Admitting: *Deleted

## 2021-11-16 IMAGING — DX DG CHEST 1V PORT
2 series · 2 of 2 positions shown · non-contrast
Comparison: None.

CLINICAL DATA: Status post port placement

EXAM:
PORTABLE CHEST 1 VIEW

[chest ap (1 of 2)]
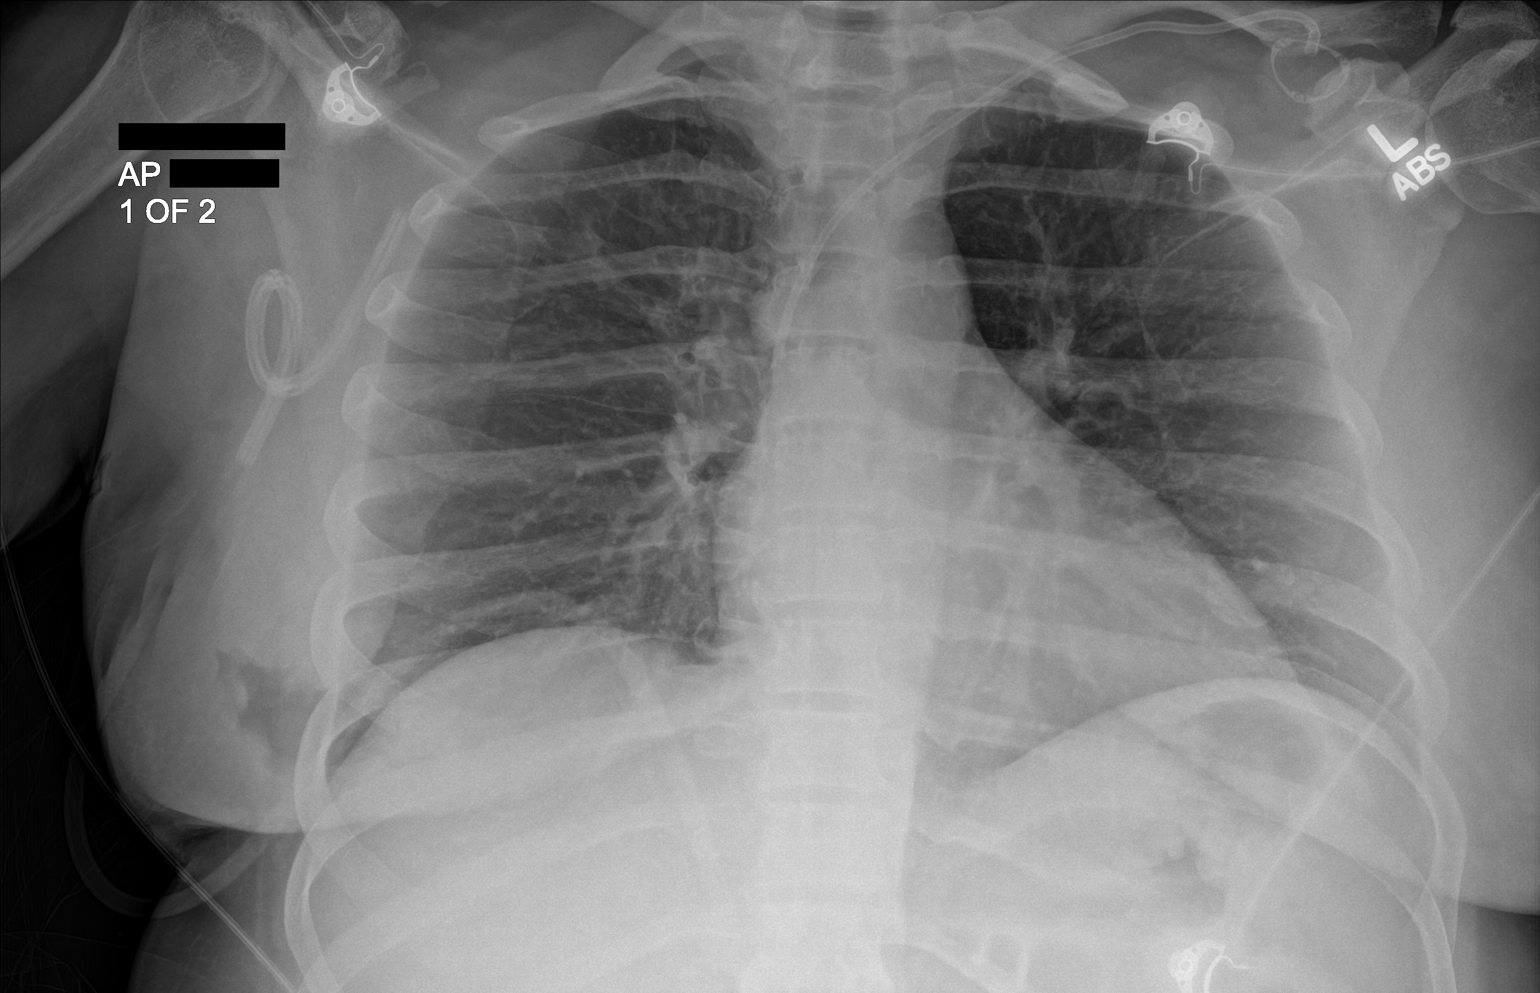

[chest ap (2 of 2)]
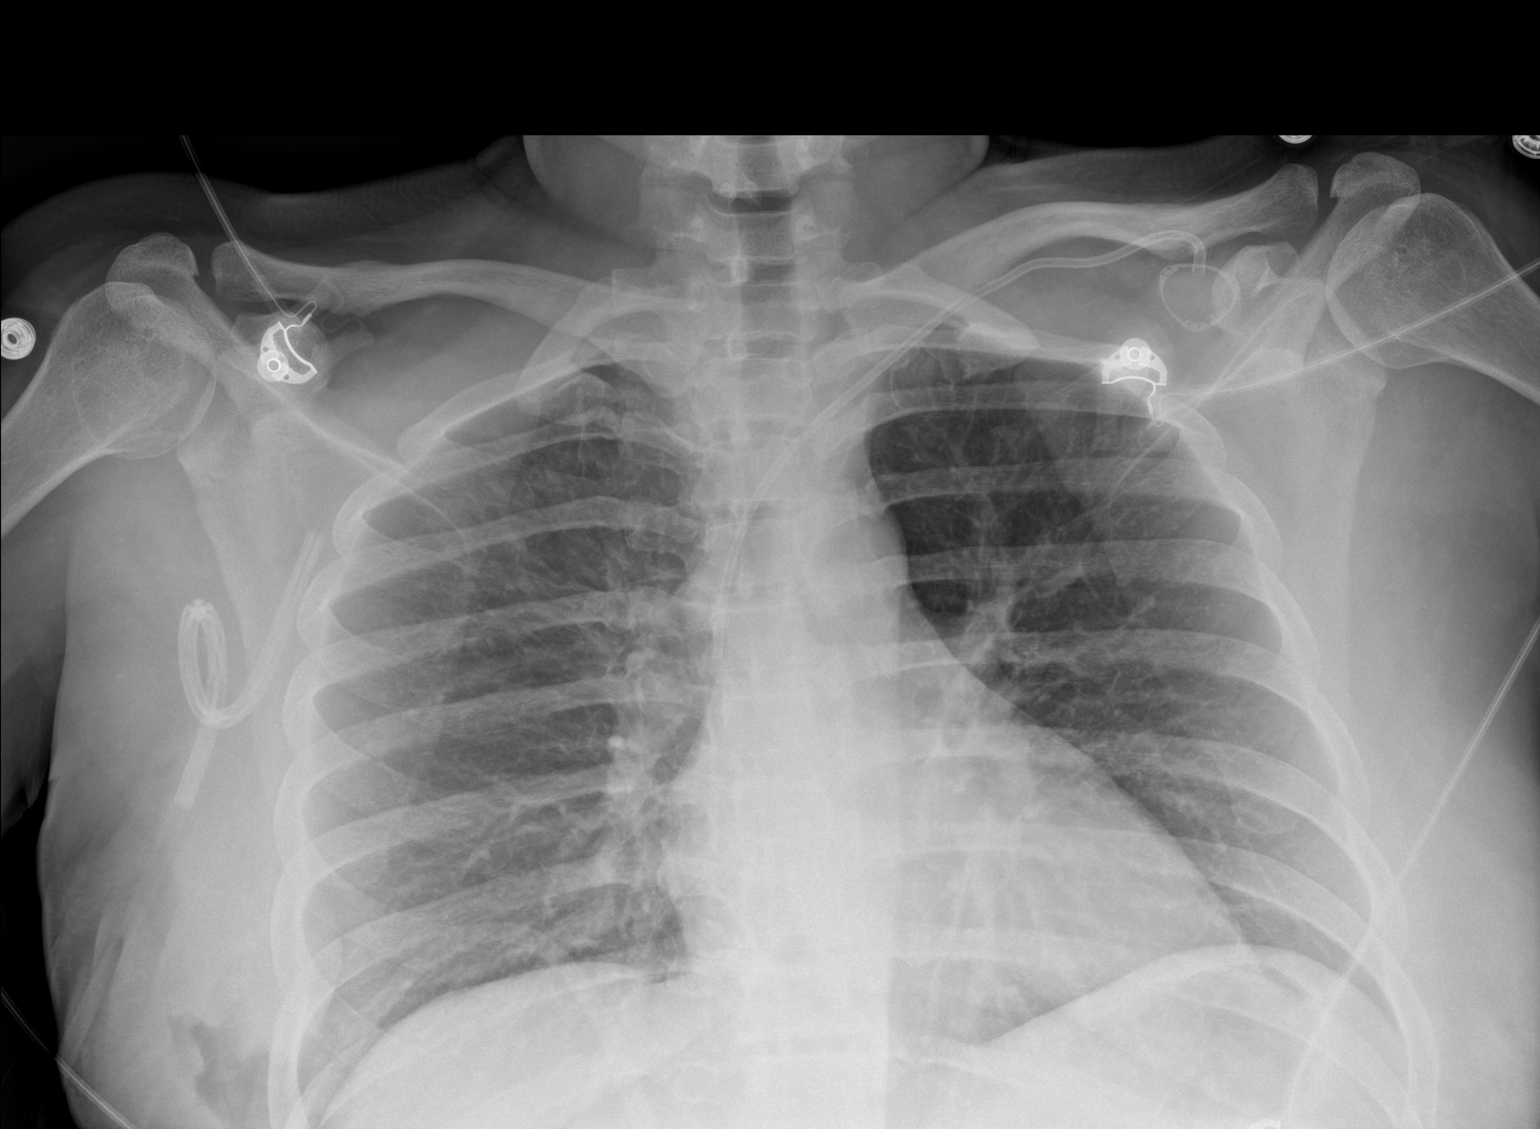

[2 of 2 positions shown; findings below may reference images not displayed]

FINDINGS: Postsurgical changes are noted on the right consistent with partial
mastectomy. Left-sided chest wall port is seen with catheter tip in
the mid superior vena cava. No focal infiltrate is seen. No
pneumothorax is noted. No sizable effusion is seen.
IMPRESSION: New left chest port as described.  No pneumothorax is noted.

## 2021-11-18 ENCOUNTER — Telehealth: Payer: Commercial Managed Care - PPO

## 2021-11-18 ENCOUNTER — Telehealth: Payer: Commercial Managed Care - PPO | Admitting: Physician Assistant

## 2021-11-18 DIAGNOSIS — B9689 Other specified bacterial agents as the cause of diseases classified elsewhere: Secondary | ICD-10-CM

## 2021-11-18 DIAGNOSIS — J019 Acute sinusitis, unspecified: Secondary | ICD-10-CM | POA: Diagnosis not present

## 2021-11-18 MED ORDER — PROMETHAZINE-DM 6.25-15 MG/5ML PO SYRP: 5.0000 mL | ORAL_SOLUTION | Freq: Four times a day (QID) | ORAL | 0 refills | Status: DC | PRN

## 2021-11-18 MED ORDER — BENZONATATE 100 MG PO CAPS
100.0000 mg | ORAL_CAPSULE | Freq: Three times a day (TID) | ORAL | 0 refills | Status: DC | PRN
Start: 2021-11-18 — End: 2021-11-18

## 2021-11-18 MED ORDER — DOXYCYCLINE HYCLATE 100 MG PO TABS: 100.0000 mg | ORAL_TABLET | Freq: Two times a day (BID) | ORAL | 0 refills | Status: DC

## 2021-11-18 MED ORDER — FLUTICASONE PROPIONATE 50 MCG/ACT NA SUSP
2.0000 | Freq: Every day | NASAL | 0 refills | Status: DC
Start: 2021-11-18 — End: 2022-09-02

## 2021-11-18 NOTE — Patient Instructions (Addendum)
[name], thank you for joining Leeanne Rio, PA-C for today's virtual visit.  While this provider is not your primary care provider (PCP), if your PCP is located in our provider database this encounter information will be shared with them immediately following your visit.  Consent: (Patient) [name] provided verbal consent for this virtual visit at the beginning of the encounter.  Current Medications:  Current Outpatient Medications:    doxycycline (VIBRA-TABS) 100 MG tablet, Take 1 tablet (100 mg total) by mouth 2 (two) times daily., Disp: 20 tablet, Rfl: 0   fluticasone (FLONASE) 50 MCG/ACT nasal spray, Place 2 sprays into both nostrils daily., Disp: 16 g, Rfl: 0   promethazine-dextromethorphan (PROMETHAZINE-DM) 6.25-15 MG/5ML syrup, Take 5 mLs by mouth 4 (four) times daily as needed for cough., Disp: 118 mL, Rfl: 0   albuterol (VENTOLIN HFA) 108 (90 Base) MCG/ACT inhaler, Inhale 2 puffs into the lungs every 6 (six) hours as needed., Disp: , Rfl:    ALPRAZolam (XANAX) 0.5 MG tablet, Take 1 tablet (0.5 mg total) by mouth 3 (three) times daily as needed for anxiety., Disp: 60 tablet, Rfl: 0   Biotin w/ Vitamins C & E (HAIR/SKIN/NAILS PO), Take 1 tablet by mouth daily., Disp: , Rfl:    celecoxib (CELEBREX) 100 MG capsule, Take 100 mg by mouth as needed., Disp: , Rfl:    celecoxib (CELEBREX) 100 MG capsule, Take by mouth., Disp: , Rfl:    chlorhexidine (PERIDEX) 0.12 % solution, , Disp: , Rfl:    diclofenac Sodium (VOLTAREN) 1 % GEL, Apply 4 g topically 4 (four) times daily., Disp: , Rfl:    EPINEPHrine 0.3 mg/0.3 mL IJ SOAJ injection, Inject 0.3 mg into the muscle once as needed., Disp: , Rfl:    exemestane (AROMASIN) 25 MG tablet, TAKE 1 TABLET (25 MG TOTAL) BY MOUTH DAILY AFTER BREAKFAST., Disp: 90 tablet, Rfl: 3   fluconazole (DIFLUCAN) 150 MG tablet, Take 150 mg by mouth every morning., Disp: , Rfl:    gabapentin (NEURONTIN) 300 MG capsule, TAKE 1 CAPSULE (300 MG  TOTAL) BY MOUTH AT BEDTIME AS NEEDED (FOR CHEMOTHERAPY INDUCED NEUROPATHY). (Patient taking differently: Take 300 mg by mouth 2 (two) times daily.), Disp: 90 capsule, Rfl: 2   hydrochlorothiazide (HYDRODIURIL) 25 MG tablet, Take 25 mg by mouth daily., Disp: , Rfl:    methocarbamol (ROBAXIN) 500 MG tablet, 750 mg., Disp: , Rfl:    methocarbamol (ROBAXIN) 750 MG tablet, Take by mouth., Disp: , Rfl:    Multiple Vitamins-Minerals (MULTIVITAMIN WITH MINERALS) tablet, Take 1 tablet by mouth daily., Disp: , Rfl:    prochlorperazine (COMPAZINE) 10 MG tablet, Take 1 tablet (10 mg total) by mouth every 6 (six) hours as needed (Nausea or vomiting)., Disp: 30 tablet, Rfl: 1   promethazine (PHENERGAN) 25 MG tablet, Take 0.5-1 tablets (12.5-25 mg total) by mouth every 6 (six) hours as needed for nausea or vomiting., Disp: 30 tablet, Rfl: 0   sertraline (ZOLOFT) 100 MG tablet, TAKE 1.5 TABLETS (150MG  TOTAL) BY MOUTH DAILY, Disp: 135 tablet, Rfl: 4   sertraline (ZOLOFT) 100 MG tablet, Take 1 tablet by mouth at bedtime., Disp: , Rfl:    sodium fluoride (FLUORISHIELD) 1.1 % GEL dental gel, See admin instructions., Disp: , Rfl:    traZODone (DESYREL) 50 MG tablet, TAKE 1-2 TABLETS BY MOUTH AT BEDTIME AS NEEDED FOR SLEEP., Disp: 180 tablet, Rfl: 0   zoledronic acid (ZOMETA) 4 MG/5ML injection, Inject into the vein., Disp: , Rfl:  No current facility-administered medications for this visit.  Facility-Administered Medications Ordered in Other Visits:    leuprolide (LUPRON) injection 11.25 mg, 11.25 mg, Intramuscular, Q90 days, Sindy Guadeloupe, MD, 11.25 mg at 02/25/21 1532   Medications ordered in this encounter:  Meds ordered this encounter  Medications   fluticasone (FLONASE) 50 MCG/ACT nasal spray    Sig: Place 2 sprays into both nostrils daily.    Dispense:  16 g    Refill:  0    Order Specific Question:   Supervising Provider    Answer:   MILLER, BRIAN [[id number]]   doxycycline (VIBRA-TABS) 100 MG tablet    Sig:  Take 1 tablet (100 mg total) by mouth 2 (two) times daily.    Dispense:  20 tablet    Refill:  0    Order Specific Question:   Supervising Provider    Answer:   Noemi Chapel [[id number]]   DISCONTD: benzonatate (TESSALON) 100 MG capsule    Sig: Take 1 capsule (100 mg total) by mouth 3 (three) times daily as needed for cough.    Dispense:  30 capsule    Refill:  0    Order Specific Question:   Supervising Provider    Answer:   Noemi Chapel [[id number]]   promethazine-dextromethorphan (PROMETHAZINE-DM) 6.25-15 MG/5ML syrup    Sig: Take 5 mLs by mouth 4 (four) times daily as needed for cough.    Dispense:  118 mL    Refill:  0    Order Specific Question:   Supervising Provider    Answer:   Sabra Heck, BRIAN [[id number]]     *If you need refills on other medications prior to your next appointment, please contact your pharmacy*  Follow-Up: Call back or seek an in-person evaluation if the symptoms worsen or if the condition fails to improve as anticipated.  Other Instructions Please take antibiotic as directed.  Increase fluid intake.  Use Saline nasal spray.  Take a daily multivitamin. Use the Flonase daily as directed. The Tessalon is for daytime cough and the promethazine-dm is for nighttime cough.  Place a humidifier in the bedroom.  Please call or return clinic if symptoms are not improving.  Sinusitis Sinusitis is redness, soreness, and swelling (inflammation) of the paranasal sinuses. Paranasal sinuses are air pockets within the bones of your face (beneath the eyes, the middle of the forehead, or above the eyes). In healthy paranasal sinuses, mucus is able to drain out, and air is able to circulate through them by way of your nose. However, when your paranasal sinuses are inflamed, mucus and air can become trapped. This can allow bacteria and other germs to grow and cause infection. Sinusitis can develop quickly and last only a short time (acute) or continue over a long period (chronic). Sinusitis that  lasts for more than 12 weeks is considered chronic.  CAUSES  Causes of sinusitis include: Allergies. Structural abnormalities, such as displacement of the cartilage that separates your nostrils (deviated septum), which can decrease the air flow through your nose and sinuses and affect sinus drainage. Functional abnormalities, such as when the small hairs (cilia) that line your sinuses and help remove mucus do not work properly or are not present. SYMPTOMS  Symptoms of acute and chronic sinusitis are the same. The primary symptoms are pain and pressure around the affected sinuses. Other symptoms include: Upper toothache. Earache. Headache. Bad breath. Decreased sense of smell and taste. A cough, which worsens when you are lying flat. Fatigue. Fever. Thick drainage  from your nose, which often is green and may contain pus (purulent). Swelling and warmth over the affected sinuses. DIAGNOSIS  Your caregiver will perform a physical exam. During the exam, your caregiver may: Look in your nose for signs of abnormal growths in your nostrils (nasal polyps). Tap over the affected sinus to check for signs of infection. View the inside of your sinuses (endoscopy) with a special imaging device with a light attached (endoscope), which is inserted into your sinuses. If your caregiver suspects that you have chronic sinusitis, one or more of the following tests may be recommended: Allergy tests. Nasal culture A sample of mucus is taken from your nose and sent to a lab and screened for bacteria. Nasal cytology A sample of mucus is taken from your nose and examined by your caregiver to determine if your sinusitis is related to an allergy. TREATMENT  Most cases of acute sinusitis are related to a viral infection and will resolve on their own within 10 days. Sometimes medicines are prescribed to help relieve symptoms (pain medicine, decongestants, nasal steroid sprays, or saline sprays).  However, for  sinusitis related to a bacterial infection, your caregiver will prescribe antibiotic medicines. These are medicines that will help kill the bacteria causing the infection.  Rarely, sinusitis is caused by a fungal infection. In theses cases, your caregiver will prescribe antifungal medicine. For some cases of chronic sinusitis, surgery is needed. Generally, these are cases in which sinusitis recurs more than 3 times per year, despite other treatments. HOME CARE INSTRUCTIONS  Drink plenty of water. Water helps thin the mucus so your sinuses can drain more easily. Use a humidifier. Inhale steam 3 to 4 times a day (for example, sit in the bathroom with the shower running). Apply a warm, moist washcloth to your face 3 to 4 times a day, or as directed by your caregiver. Use saline nasal sprays to help moisten and clean your sinuses. Take over-the-counter or prescription medicines for pain, discomfort, or fever only as directed by your caregiver. SEEK IMMEDIATE MEDICAL CARE IF: You have increasing pain or severe headaches. You have nausea, vomiting, or drowsiness. You have swelling around your face. You have vision problems. You have a stiff neck. You have difficulty breathing. MAKE SURE YOU:  Understand these instructions. Will watch your condition. Will get help right away if you are not doing well or get worse. Document Released: 10/03/2005 Document Revised: 12/26/2011 Document Reviewed: 10/18/2011 [facility] Patient Information 2014 [place], [place].    If you have been instructed to have an in-person evaluation today at a local Urgent Care facility, please use the link below. It will take you to a list of all of our available [facility], including address, phone number and hours of operation. Please do not delay care.  [facility]  If you or a family member do not have a primary care provider, use the link below to schedule a visit and establish care. When you  choose a [facility] primary care physician or advanced practice provider, you gain a long-term partner in health. Find a Primary Care Provider  Learn more about [facility]'s in-office and virtual care options: [facility]

## 2021-11-18 NOTE — Progress Notes (Signed)
Virtual Visit Consent   [name], you are scheduled for a virtual visit with a [facility] provider today.     Just as with appointments in the office, your consent must be obtained to participate.  Your consent will be active for this visit and any virtual visit you may have with one of our providers in the next 365 days.     If you have a MyChart account, a copy of this consent can be sent to you electronically.  All virtual visits are billed to your insurance company just like a traditional visit in the office.    As this is a virtual visit, video technology does not allow for your provider to perform a traditional examination.  This may limit your provider's ability to fully assess your condition.  If your provider identifies any concerns that need to be evaluated in person or the need to arrange testing (such as labs, EKG, etc.), we will make arrangements to do so.     Although advances in technology are sophisticated, we cannot ensure that it will always work on either your end or our end.  If the connection with a video visit is poor, the visit may have to be switched to a telephone visit.  With either a video or telephone visit, we are not always able to ensure that we have a secure connection.     I need to obtain your verbal consent now.   Are you willing to proceed with your visit today?    [name] has provided verbal consent on 11/18/2021 for a virtual visit (video or telephone).   [name], [place]   Date: 11/18/2021 11:54 AM   Virtual Visit via Video Note   I, [name], connected with  [name]  ([id number], 11/28/1980) on 11/18/21 at 11:45 AM EST by a video-enabled telemedicine application and verified that I am speaking with the correct person using two identifiers.  Location: Patient: Virtual Visit Location Patient: Work Provider: Scientist, research (medical) Provider: Home Office   I discussed the limitations of evaluation and  management by telemedicine and the availability of in person appointments. The patient expressed understanding and agreed to proceed.    History of Present Illness: [name] is a 41 y.o. who identifies as a female who was assigned female at birth, and is being seen today for possible sinusitis. Notes some ongoing headache, nasal and head congestion and sinus pressure, now with left sided ear pain and maxillary discomfort. Notes symptoms started last week and have worsened since that time. Denies fever, chills, aches. Some mild positional dizziness with sudden positional changes. Took COVID and strep tests which were negative. Has taken OTC Mucinex, cough drops, Nyquil Cold/Sore Throat for symptoms with only slight relief.  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   RUQ pain 03/03/2020   Impingement syndrome of left shoulder region 12/14/2017   Asthma, stable, mild intermittent 03/22/2017   B12 deficiency  09/19/2016    Allergies:  Allergies  Allergen Reactions   Bee Venom Shortness Of Breath and Swelling    Swelling at site    Fish Allergy Anaphylaxis and Shortness Of Breath   Fish-Derived Products Itching, Rash, Shortness Of Breath and Swelling   Latex Hives, Shortness Of Breath, Swelling and Rash   Penicillins Hives and Itching    Has patient had a PCN reaction causing immediate rash, facial/tongue/throat swelling, SOB or lightheadedness with hypotension: Yes Has patient had a PCN reaction  causing severe rash involving mucus membranes or skin necrosis: Yes Has patient had a PCN reaction that required hospitalization No Has patient had a PCN reaction occurring within the last 10 years: No If all of the above answers are "NO", then may proceed with Cephalosporin use.    Shellfish Allergy Hives   Adhesive [Tape] Other (See Comments)    Removes skin even after only 24 hours Takes off skin   Ciprofloxacin Rash    Arm redness and swelling within mins of starting IV Cipro. Treated with benadryl   Tapentadol Other (See Comments)    Removes skin even after only 24 hours Takes off skin   Medications:  Current Outpatient Medications:    doxycycline (VIBRA-TABS) 100 MG tablet, Take 1 tablet (100 mg total) by mouth 2 (two) times daily., Disp: 20 tablet, Rfl: 0   fluticasone (FLONASE) 50 MCG/ACT nasal spray, Place 2 sprays into both nostrils daily., Disp: 16 g, Rfl: 0   promethazine-dextromethorphan (PROMETHAZINE-DM) 6.25-15 MG/5ML syrup, Take 5 mLs by mouth 4 (four) times daily as needed for cough., Disp: 118 mL, Rfl: 0   albuterol (VENTOLIN HFA) 108 (90 Base) MCG/ACT inhaler, Inhale 2 puffs into the lungs every 6 (six) hours as needed., Disp: , Rfl:    ALPRAZolam (XANAX) 0.5 MG tablet, Take 1 tablet (0.5 mg total) by mouth 3 (three) times daily as needed for anxiety., Disp: 60 tablet, Rfl: 0   Biotin w/ Vitamins C & E (HAIR/SKIN/NAILS PO), Take 1 tablet by mouth daily., Disp: , Rfl:    celecoxib (CELEBREX) 100 MG capsule, Take 100 mg by mouth as needed., Disp: , Rfl:    celecoxib (CELEBREX) 100 MG capsule, Take by mouth., Disp: , Rfl:    chlorhexidine (PERIDEX) 0.12 % solution, , Disp: , Rfl:    diclofenac Sodium (VOLTAREN) 1 % GEL, Apply 4 g topically 4 (four) times daily., Disp: , Rfl:    EPINEPHrine 0.3 mg/0.3 mL IJ SOAJ injection, Inject 0.3 mg into the muscle once as needed., Disp: , Rfl:    exemestane (AROMASIN) 25 MG tablet, TAKE 1 TABLET (25 MG TOTAL) BY MOUTH DAILY AFTER  BREAKFAST., Disp: 90 tablet, Rfl: 3   fluconazole (DIFLUCAN) 150 MG tablet, Take 150 mg by mouth every morning., Disp: , Rfl:    gabapentin (NEURONTIN) 300 MG capsule, TAKE 1 CAPSULE (300 MG TOTAL) BY MOUTH AT BEDTIME AS NEEDED (FOR CHEMOTHERAPY INDUCED NEUROPATHY). (Patient taking differently: Take 300 mg by mouth 2 (two) times daily.), Disp: 90 capsule, Rfl: 2   hydrochlorothiazide (HYDRODIURIL) 25 MG tablet, Take 25 mg by mouth daily., Disp: , Rfl:    methocarbamol (ROBAXIN) 500 MG tablet, 750 mg., Disp: , Rfl:    methocarbamol (ROBAXIN) 750 MG tablet, Take by mouth., Disp: , Rfl:    Multiple Vitamins-Minerals (MULTIVITAMIN WITH MINERALS) tablet, Take 1 tablet by mouth daily., Disp: , Rfl:    prochlorperazine (COMPAZINE) 10 MG tablet, Take 1 tablet (10 mg total) by mouth every 6 (six)  hours as needed (Nausea or vomiting)., Disp: 30 tablet, Rfl: 1   promethazine (PHENERGAN) 25 MG tablet, Take 0.5-1 tablets (12.5-25 mg total) by mouth every 6 (six) hours as needed for nausea or vomiting., Disp: 30 tablet, Rfl: 0   sertraline (ZOLOFT) 100 MG tablet, TAKE 1.5 TABLETS (150MG  TOTAL) BY MOUTH DAILY, Disp: 135 tablet, Rfl: 4   sertraline (ZOLOFT) 100 MG tablet, Take 1 tablet by mouth at bedtime., Disp: , Rfl:    sodium fluoride (FLUORISHIELD) 1.1 % GEL dental gel, See admin instructions., Disp: , Rfl:    traZODone (DESYREL) 50 MG tablet, TAKE 1-2 TABLETS BY MOUTH AT BEDTIME AS NEEDED FOR SLEEP., Disp: 180 tablet, Rfl: 0   zoledronic acid (ZOMETA) 4 MG/5ML injection, Inject into the vein., Disp: , Rfl:  No current facility-administered medications for this visit.  Facility-Administered Medications Ordered in Other Visits:    leuprolide (LUPRON) injection 11.25 mg, 11.25 mg, Intramuscular, Q90 days, Sindy Guadeloupe, MD, 11.25 mg at 02/25/21 1532  Observations/Objective: Patient is well-developed, well-nourished in no acute distress.  Resting comfortably at work.  Head is normocephalic, atraumatic.  No  labored breathing. Speech is clear and coherent with logical content.  Patient is alert and oriented at baseline.   Assessment and Plan: 1. Acute bacterial sinusitis - fluticasone (FLONASE) 50 MCG/ACT nasal spray; Place 2 sprays into both nostrils daily.  Dispense: 16 g; Refill: 0 - doxycycline (VIBRA-TABS) 100 MG tablet; Take 1 tablet (100 mg total) by mouth 2 (two) times daily.  Dispense: 20 tablet; Refill: 0 - promethazine-dextromethorphan (PROMETHAZINE-DM) 6.25-15 MG/5ML syrup; Take 5 mLs by mouth 4 (four) times daily as needed for cough.  Dispense: 118 mL; Refill: 0  Rx Doxycycline.  Increase fluids.  Rest.  Saline nasal spray.  Probiotic.  Mucinex as directed.  Humidifier in bedroom. Will refill Tessalon for daytime cough and promethazine-dm for nighttime cough. Flonase per orders.  Call or return to clinic if symptoms are not improving.   Follow Up Instructions: I discussed the assessment and treatment plan with the patient. The patient was provided an opportunity to ask questions and all were answered. The patient agreed with the plan and demonstrated an understanding of the instructions.  A copy of instructions were sent to the patient via MyChart unless otherwise noted below.   The patient was advised to call back or seek an in-person evaluation if the symptoms worsen or if the condition fails to improve as anticipated.  Time:  I spent 13 minutes with the patient via telehealth technology discussing the above problems/concerns.    [name] Rio, PA-C

## 2021-11-26 ENCOUNTER — Encounter: Payer: Self-pay | Admitting: Plastic Surgery

## 2021-11-26 ENCOUNTER — Other Ambulatory Visit: Payer: Self-pay

## 2021-11-26 ENCOUNTER — Ambulatory Visit (INDEPENDENT_AMBULATORY_CARE_PROVIDER_SITE_OTHER): Payer: Commercial Managed Care - PPO | Admitting: Plastic Surgery

## 2021-11-26 VITALS — Wt 208.0 lb

## 2021-11-26 DIAGNOSIS — Z719 Counseling, unspecified: Secondary | ICD-10-CM

## 2021-11-26 DIAGNOSIS — C50411 Malignant neoplasm of upper-outer quadrant of right female breast: Secondary | ICD-10-CM | POA: Diagnosis not present

## 2021-11-26 DIAGNOSIS — Z170 Estrogen receptor positive status [ER+]: Secondary | ICD-10-CM | POA: Diagnosis not present

## 2021-11-26 DIAGNOSIS — N6489 Other specified disorders of breast: Secondary | ICD-10-CM

## 2021-11-26 DIAGNOSIS — Z9013 Acquired absence of bilateral breasts and nipples: Secondary | ICD-10-CM | POA: Diagnosis not present

## 2021-11-26 NOTE — Addendum Note (Signed)
Addended by: Tresa Moore on: 11/26/2021 09:45 AM   Modules accepted: Orders

## 2021-11-26 NOTE — Progress Notes (Addendum)
° °  Subjective:  ° ° Patient ID: [name], female    DOB: 05/02/1981, 40 y.o.   MRN: [id number] ° °The patient is a 40-year-old female here for follow-up after undergoing bilateral breast reconstruction.  She was originally seen in the emergency room for a flareup of her gallbladder when imaging revealed abnormalities in her breast tissue.  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  She had 2 lumpectomies and still had positive margins so the decision was made for bilateral mastectomies.  Her other surgical history includes appendectomy, C-section, hysterectomy, cholecystectomy.  She has a history of anxiety, depression and seizure disorder.  She was genetic negative.  She had the mastectomies in November 2021.  She then had the implants placed in March 2022 with [employer] smooth round ultrahigh profile 750 cc implants.  She then had fat grafting in September 2022.  She would like nipple areola tattooing.  Her breasts are soft and there is no concern for capsular contracture.  She would like a little better symmetry and some fat grafting.  She is 5 feet 7 inches tall and weighs 208 pounds. ° ° °Review of Systems  °Constitutional: Negative.   °Eyes: Negative.   °Respiratory: Negative.    °Cardiovascular: Negative.   °Gastrointestinal: Negative.   °Endocrine: Negative.   °Genitourinary: Negative.   °Musculoskeletal: Negative.   °Skin: Negative.   ° °   °Objective:  ° Physical Exam °Constitutional:   °   Appearance: Normal appearance.  °HENT:  °   Head: Normocephalic and atraumatic.  °Cardiovascular:  °   Rate and Rhythm: Normal rate.  °   Pulses: Normal pulses.  °Pulmonary:  °   Effort: Pulmonary effort is normal.  °Abdominal:  °   General: There is no distension.  °   Palpations: Abdomen is soft.  °Skin: °   General: Skin is warm.  °   Capillary Refill: Capillary refill takes less than 2 seconds.  °   Coloration: Skin is not  jaundiced.  °   Findings: No bruising.  °Neurological:  °   Mental Status: She is alert and oriented to person, place, and time.  °Psychiatric:     °   Mood and Affect: Mood normal.     °   Behavior: Behavior normal.     °   Thought Content: Thought content normal.  ° ° °   °Assessment & Plan:  ° °  ICD-10-CM   °1. Malignant neoplasm of upper-outer quadrant of right breast in female, estrogen receptor positive (HCC)  C50.411   ° Z17.0   °  °2. Breast asymmetry  N64.89   °  °3. Absence of breast, acquired, bilateral  Z90.13   °  °  °Plan on fat grafting.  Perhaps April or May would be a good time.  This will be for improved symmetry of bilateral breasts. ° °We will then get her set up for tattooing for nipple areolar reconstruction. °

## 2021-11-30 ENCOUNTER — Inpatient Hospital Stay: Payer: Commercial Managed Care - PPO | Attending: Oncology

## 2021-11-30 ENCOUNTER — Other Ambulatory Visit: Payer: Self-pay

## 2021-11-30 DIAGNOSIS — Z170 Estrogen receptor positive status [ER+]: Secondary | ICD-10-CM | POA: Diagnosis not present

## 2021-11-30 DIAGNOSIS — C50411 Malignant neoplasm of upper-outer quadrant of right female breast: Secondary | ICD-10-CM | POA: Diagnosis not present

## 2021-11-30 MED ORDER — LEUPROLIDE ACETATE (3 MONTH) 11.25 MG IM KIT
11.2500 mg | PACK | INTRAMUSCULAR | Status: DC
  Administered 2021-11-30: 11.25 mg via INTRAMUSCULAR
  Filled 2021-11-30: qty 11.25

## 2021-12-10 ENCOUNTER — Encounter: Payer: Self-pay | Admitting: Plastic Surgery

## 2021-12-15 ENCOUNTER — Ambulatory Visit: Payer: Commercial Managed Care - PPO | Admitting: Physician Assistant

## 2021-12-20 ENCOUNTER — Telehealth: Payer: Self-pay

## 2021-12-20 NOTE — Telephone Encounter (Signed)
[name] LVM stating that [employer] will deny the PA for the surgery due to dx codes. [name] said per insurance that it needs to be "history of breast cancer" and once it is changed then they will cover it.  ?

## 2021-12-22 ENCOUNTER — Ambulatory Visit: Payer: Commercial Managed Care - PPO | Admitting: Physician Assistant

## 2022-01-04 ENCOUNTER — Encounter: Payer: Self-pay | Admitting: Oncology

## 2022-01-28 ENCOUNTER — Encounter: Payer: Self-pay | Admitting: Surgical

## 2022-01-28 ENCOUNTER — Ambulatory Visit (INDEPENDENT_AMBULATORY_CARE_PROVIDER_SITE_OTHER): Payer: Commercial Managed Care - PPO | Admitting: Surgical

## 2022-01-28 VITALS — BP 118/81 | HR 102 | Ht 67.0 in

## 2022-01-28 DIAGNOSIS — N6489 Other specified disorders of breast: Secondary | ICD-10-CM

## 2022-01-28 DIAGNOSIS — C50411 Malignant neoplasm of upper-outer quadrant of right female breast: Secondary | ICD-10-CM

## 2022-01-28 DIAGNOSIS — Z9013 Acquired absence of bilateral breasts and nipples: Secondary | ICD-10-CM

## 2022-01-28 DIAGNOSIS — Z170 Estrogen receptor positive status [ER+]: Secondary | ICD-10-CM

## 2022-01-28 MED ORDER — DOXYCYCLINE HYCLATE 100 MG PO TABS: 100.0000 mg | ORAL_TABLET | Freq: Two times a day (BID) | ORAL | 0 refills | Status: AC

## 2022-01-28 MED ORDER — OXYCODONE HCL 5 MG PO TABS: 5.0000 mg | ORAL_TABLET | Freq: Four times a day (QID) | ORAL | 0 refills | Status: AC | PRN

## 2022-01-28 NOTE — H&P (View-Only) (Signed)
? ?  Patient ID: [name], female    DOB: September 02, 1981, 41 y.o.   MRN: [id number] ? ?Chief Complaint  ?Patient presents with  ? Pre-op Exam  ? ? ?  ICD-10-CM   ?1. Malignant neoplasm of upper-outer quadrant of right breast in female, estrogen receptor positive ([place])  C50.411   ? Z17.0   ?  ?2. Breast asymmetry  N64.89   ?  ?3. Absence of breast, acquired, bilateral  Z90.13   ?  ? ? ?History of Present Illness: ?[name] is a 41 y.o.  female  with a history of bilateral breast reconstruction.  She presents for preoperative evaluation for upcoming procedure, liposuction for lipo filling of bilateral breast, scheduled for 02/16/2022 with Dr. Marla Roe. ? ?The patient has not had problems with anesthesia. She reports she is a natural redhead, so wakes up easily. ? ?No history of DVT/PE.  No family history of DVT/PE.  No family or personal history of bleeding or clotting disorders.  Patient is not currently taking any blood thinners.  No history of CVA/MI.  ? ?Summary of Previous Visit: History of right breast invasive carcinoma, had 2 lumpectomies with positive margins and subsequent bilateral mastectomies.  She had mastectomy in November 2021 and then implants placed March 2022.  She has [place] smooth round ultrahigh profile 750 cc implants.  She previously had fat grafting in September 2022.  She would like better symmetry and some fat grafting. ? 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 She will let us know if she needs a note for out of work. ? ?PMH Significant for: Breast cancer status post bilateral breast reconstruction, palpitations -evaluated by cardiology 11/05/2021. ? ?She was seen by cardiology on 11/10/2021 for evaluation of palpitations.  She had an echocardiogram which showed normal left ventricle, left ventricle systolic function normal, normal right ventricle.  She had EKG on 10/25/2021 with normal sinus rhythm.  She reports she wore a Zio patch for about 4  days and there were no abnormal rhythms noted. ? ?She is doing well.  She is hopeful to have enough fat volume placed to improve contour and size, specifically the left side. ? ?She reports she has 2 cysts of her back that have been bothersome to her, reports the upper mid cyst she has had for many years and it seems to be draining intermittently and reports it "fills up" more frequently now.  The one on her right mid back is newer and has been bothering her.  They are both tender.  They are approximately 0.5 cm in size  ? ?Past Medical History: ?Allergies: ?Allergies  ?Allergen Reactions  ? Bee Venom Shortness Of Breath and Swelling  ?  Swelling at site   ? Fish Allergy Anaphylaxis and Shortness Of Breath  ? Fish-Derived Products Itching, Rash, Shortness Of Breath and Swelling  ? Latex Hives, Shortness Of Breath, Swelling and Rash  ? Penicillins Hives and Itching  ?  Has patient had a PCN reaction causing immediate rash, facial/tongue/throat swelling, SOB or lightheadedness with hypotension: Yes ?Has patient had a PCN reaction causing severe rash involving mucus membranes or skin necrosis: Yes ?Has patient had a PCN reaction that required hospitalization No ?Has patient had a PCN reaction occurring within the last 10 years: No ?If all of the above answers are "NO", then may proceed with Cephalosporin use. ?  ? Shellfish Allergy Hives  ? Adhesive [Tape] Other (See Comments)  ?  Removes skin even after only 24 hours ?Takes off skin  ? Ciprofloxacin Rash  ?  Arm redness and swelling within mins of starting IV Cipro. ?Treated with benadryl  ? Tapentadol Other (See Comments)  ?  Removes skin even after only 24 hours ?Takes off skin  ? ? ?Current Medications: ? ?Current Outpatient Medications:  ?  albuterol (VENTOLIN HFA) 108 (90 Base) MCG/ACT inhaler, Inhale 2 puffs into the lungs every 6 (six) hours as needed., Disp: , Rfl:  ?  ALPRAZolam (XANAX) 0.5 MG tablet, Take 1 tablet (0.5 mg total) by mouth 3 (three) times  daily as needed for anxiety., Disp: 60 tablet, Rfl: 0 ?  Biotin w/ Vitamins C & E (HAIR/SKIN/NAILS PO), Take 1 tablet by mouth daily., Disp: , Rfl:  ?  celecoxib (CELEBREX) 100 MG capsule, Take 100 mg by mouth 2 (two) times daily., Disp: , Rfl:  ?  chlorhexidine (PERIDEX) 0.12 % solution, , Disp: , Rfl:  ?  diclofenac Sodium (VOLTAREN) 1 % GEL, Apply 4 g topically 4 (four) times daily., Disp: , Rfl:  ?  doxycycline (VIBRA-TABS) 100 MG tablet, Take 1 tablet (100 mg total) by mouth 2 (two) times daily., Disp: 20 tablet, Rfl: 0 ?  doxycycline (VIBRA-TABS) 100 MG tablet, Take 1 tablet (100 mg total) by mouth 2 (two) times daily for 5 days., Disp: 10 tablet, Rfl: 0 ?  EPINEPHrine 0.3 mg/0.3 mL IJ SOAJ injection, Inject 0.3 mg into the muscle once as needed., Disp: , Rfl:  ?  exemestane (AROMASIN) 25 MG tablet, TAKE 1 TABLET (25 MG TOTAL) BY MOUTH DAILY AFTER BREAKFAST., Disp: 90 tablet, Rfl: 3 ?  fluconazole (DIFLUCAN) 150 MG tablet, Take 150 mg by mouth every morning., Disp: , Rfl:  ?  fluticasone (FLONASE) 50 MCG/ACT nasal spray, Place 2 sprays into both nostrils daily., Disp: 16 g, Rfl: 0 ?  gabapentin (NEURONTIN) 300 MG capsule, TAKE 1 CAPSULE (300 MG TOTAL) BY MOUTH AT BEDTIME AS NEEDED (FOR CHEMOTHERAPY INDUCED NEUROPATHY). (Patient taking differently: Take 300 mg by mouth 3 (three) times daily. Pt reports 1 qam - 2 qhs), Disp: 90 capsule, Rfl: 2 ?  hydrochlorothiazide (HYDRODIURIL) 25 MG tablet, Take 25 mg by mouth daily., Disp: , Rfl:  ?  methocarbamol (ROBAXIN) 500 MG tablet, 750 mg., Disp: , Rfl:  ?  methocarbamol (ROBAXIN) 750 MG tablet, Take by mouth., Disp: , Rfl:  ?  Multiple Vitamins-Minerals (MULTIVITAMIN WITH MINERALS) tablet, Take 1 tablet by mouth daily., Disp: , Rfl:  ?  oxyCODONE (OXY IR/ROXICODONE) 5 MG immediate release tablet, Take 1 tablet (5 mg total) by mouth every 6 (six) hours as needed for up to 5 days for severe pain., Disp: 20 tablet, Rfl: 0 ?  prochlorperazine (COMPAZINE) 10 MG tablet,  Take 1 tablet (10 mg total) by mouth every 6 (six) hours as needed (Nausea or vomiting)., Disp: 30 tablet, Rfl: 1 ?  promethazine (PHENERGAN) 25 MG tablet, Take 0.5-1 tablets (12.5-25 mg total) by mouth every 6 (six) hours as needed for nausea or vomiting., Disp: 30 tablet, Rfl: 0 ?  promethazine-dextromethorphan (PROMETHAZINE-DM) 6.25-15 MG/5ML syrup, Take 5 mLs by mouth 4 (four) times daily as needed for cough., Disp: 118 mL, Rfl: 0 ?  sertraline (ZOLOFT) 100 MG tablet, TAKE 1.5 TABLETS ('150MG'$  TOTAL) BY MOUTH DAILY, Disp: 135 tablet, Rfl: 4 ?  sodium fluoride (FLUORISHIELD) 1.1 % GEL dental gel, See admin instructions., Disp: , Rfl:  ?  traZODone (DESYREL) 50 MG tablet, TAKE 1-2 TABLETS BY MOUTH  AT BEDTIME AS NEEDED FOR SLEEP., Disp: 180 tablet, Rfl: 0 ?  zoledronic acid (ZOMETA) 4 MG/5ML injection, Inject into the vein., Disp: , Rfl:  ?No current facility-administered medications for this visit. ? ?Facility-Administered Medications Ordered in Other Visits:  ?  leuprolide (LUPRON) injection 11.25 mg, 11.25 mg, Intramuscular, Q90 days, Sindy Guadeloupe, MD, 11.25 mg at 02/25/21 1532 ? ?Past Medical Problems: ?Past Medical History:  ?Diagnosis Date  ? Anemia   ? due to chemo  ? Anxiety   ? Asthma   ? Atrophic pancreas   ? Breast cancer ([place]) 04/2020  ? Chronic back pain   ? Depression   ? Family history of bladder cancer   ? Family history of breast cancer   ? Family history of colon cancer   ? GERD (gastroesophageal reflux disease) 04/2020  ? probably due to stress with cancer diagnosis  ? History of kidney stones   ? right kidney currently  ? Insomnia   ? Seizure ([place]) 1999  ? 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 and no further episodes  ? ? ?Past Surgical History: ?Past Surgical History:  ?Procedure Laterality Date  ? ABDOMINAL HYSTERECTOMY    ? APPENDECTOMY    ? BREAST BIOPSY Right 04/02/2020  ? [place] bx, ribbon, marker, invasive mamm  ? BREAST RECONSTRUCTION WITH PLACEMENT OF TISSUE EXPANDER AND  FLEX HD (ACELLULAR HYDRATED DERMIS) Bilateral 09/14/2020  ? Procedure: IMMEDIATE BILATERAL BREAST RECONSTRUCTION WITH PLACEMENT OF TISSUE EXPANDER AND FLEX HD (ACELLULAR HYDRATED DERMIS) ORDERED 10/22;  [facility]

## 2022-01-28 NOTE — Progress Notes (Signed)
? ?  Patient ID: [name], female    DOB: [birth year], 41 y.o.   MRN: [id number] ? ?Chief Complaint  ?Patient presents with  ? Pre-op Exam  ? ? ?  ICD-10-CM   ?1. Malignant neoplasm of upper-outer quadrant of right breast in female, estrogen receptor positive ([place])  C50.411   ? Z17.0   ?  ?2. Breast asymmetry  N64.89   ?  ?3. Absence of breast, acquired, bilateral  Z90.13   ?  ? ? ?History of Present Illness: ?LUNDON ROSIER is a 41 y.o.  female  with a history of bilateral breast reconstruction.  She presents for preoperative evaluation for upcoming procedure, liposuction for lipo filling of bilateral breast, scheduled for 02/16/2022 with Dr. Marla Roe. ? ?The patient has not had problems with anesthesia. She reports she is a natural redhead, so wakes up easily. ? ?No history of DVT/PE.  No family history of DVT/PE.  No family or personal history of bleeding or clotting disorders.  Patient is not currently taking any blood thinners.  No history of CVA/MI.  ? ?Summary of Previous Visit: History of right breast invasive carcinoma, had 2 lumpectomies with positive margins and subsequent bilateral mastectomies.  She had mastectomy in November 2021 and then implants placed March 2022.  She has [place] smooth round ultrahigh profile 750 cc implants.  She previously had fat grafting in September 2022.  She would like better symmetry and some fat grafting. ? 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 She will let us know if she needs a note for out of work. ? ?PMH Significant for: Breast cancer status post bilateral breast reconstruction, palpitations -evaluated by cardiology 11/05/2021. ? ?She was seen by cardiology on 11/10/2021 for evaluation of palpitations.  She had an echocardiogram which showed normal left ventricle, left ventricle systolic function normal, normal right ventricle.  She had EKG on 10/25/2021 with normal sinus rhythm.  She reports she wore a Zio patch for about 4  days and there were no abnormal rhythms noted. ? ?She is doing well.  She is hopeful to have enough fat volume placed to improve contour and size, specifically the left side. ? ?She reports she has 2 cysts of her back that have been bothersome to her, reports the upper mid cyst she has had for many years and it seems to be draining intermittently and reports it "fills up" more frequently now.  The one on her right mid back is newer and has been bothering her.  They are both tender.  They are approximately 0.5 cm in size  ? ?Past Medical History: ?Allergies: ?Allergies  ?Allergen Reactions  ? Bee Venom Shortness Of Breath and Swelling  ?  Swelling at site   ? Fish Allergy Anaphylaxis and Shortness Of Breath  ? Fish-Derived Products Itching, Rash, Shortness Of Breath and Swelling  ? Latex Hives, Shortness Of Breath, Swelling and Rash  ? Penicillins Hives and Itching  ?  Has patient had a PCN reaction causing immediate rash, facial/tongue/throat swelling, SOB or lightheadedness with hypotension: Yes ?Has patient had a PCN reaction causing severe rash involving mucus membranes or skin necrosis: Yes ?Has patient had a PCN reaction that required hospitalization No ?Has patient had a PCN reaction occurring within the last 10 years: No ?If all of the above answers are "NO", then may proceed with Cephalosporin use. ?  ? Shellfish Allergy Hives  ? Adhesive [Tape] Other (See Comments)  ?  Removes skin even after only 24 hours ?Takes off skin  ? Ciprofloxacin Rash  ?  Arm redness and swelling within mins of starting IV Cipro. ?Treated with benadryl  ? Tapentadol Other (See Comments)  ?  Removes skin even after only 24 hours ?Takes off skin  ? ? ?Current Medications: ? ?Current Outpatient Medications:  ?  albuterol (VENTOLIN HFA) 108 (90 Base) MCG/ACT inhaler, Inhale 2 puffs into the lungs every 6 (six) hours as needed., Disp: , Rfl:  ?  ALPRAZolam (XANAX) 0.5 MG tablet, Take 1 tablet (0.5 mg total) by mouth 3 (three) times  daily as needed for anxiety., Disp: 60 tablet, Rfl: 0 ?  Biotin w/ Vitamins C & E (HAIR/SKIN/NAILS PO), Take 1 tablet by mouth daily., Disp: , Rfl:  ?  celecoxib (CELEBREX) 100 MG capsule, Take 100 mg by mouth 2 (two) times daily., Disp: , Rfl:  ?  chlorhexidine (PERIDEX) 0.12 % solution, , Disp: , Rfl:  ?  diclofenac Sodium (VOLTAREN) 1 % GEL, Apply 4 g topically 4 (four) times daily., Disp: , Rfl:  ?  doxycycline (VIBRA-TABS) 100 MG tablet, Take 1 tablet (100 mg total) by mouth 2 (two) times daily., Disp: 20 tablet, Rfl: 0 ?  doxycycline (VIBRA-TABS) 100 MG tablet, Take 1 tablet (100 mg total) by mouth 2 (two) times daily for 5 days., Disp: 10 tablet, Rfl: 0 ?  EPINEPHrine 0.3 mg/0.3 mL IJ SOAJ injection, Inject 0.3 mg into the muscle once as needed., Disp: , Rfl:  ?  exemestane (AROMASIN) 25 MG tablet, TAKE 1 TABLET (25 MG TOTAL) BY MOUTH DAILY AFTER BREAKFAST., Disp: 90 tablet, Rfl: 3 ?  fluconazole (DIFLUCAN) 150 MG tablet, Take 150 mg by mouth every morning., Disp: , Rfl:  ?  fluticasone (FLONASE) 50 MCG/ACT nasal spray, Place 2 sprays into both nostrils daily., Disp: 16 g, Rfl: 0 ?  gabapentin (NEURONTIN) 300 MG capsule, TAKE 1 CAPSULE (300 MG TOTAL) BY MOUTH AT BEDTIME AS NEEDED (FOR CHEMOTHERAPY INDUCED NEUROPATHY). (Patient taking differently: Take 300 mg by mouth 3 (three) times daily. Pt reports 1 qam - 2 qhs), Disp: 90 capsule, Rfl: 2 ?  hydrochlorothiazide (HYDRODIURIL) 25 MG tablet, Take 25 mg by mouth daily., Disp: , Rfl:  ?  methocarbamol (ROBAXIN) 500 MG tablet, 750 mg., Disp: , Rfl:  ?  methocarbamol (ROBAXIN) 750 MG tablet, Take by mouth., Disp: , Rfl:  ?  Multiple Vitamins-Minerals (MULTIVITAMIN WITH MINERALS) tablet, Take 1 tablet by mouth daily., Disp: , Rfl:  ?  oxyCODONE (OXY IR/ROXICODONE) 5 MG immediate release tablet, Take 1 tablet (5 mg total) by mouth every 6 (six) hours as needed for up to 5 days for severe pain., Disp: 20 tablet, Rfl: 0 ?  prochlorperazine (COMPAZINE) 10 MG tablet,  Take 1 tablet (10 mg total) by mouth every 6 (six) hours as needed (Nausea or vomiting)., Disp: 30 tablet, Rfl: 1 ?  promethazine (PHENERGAN) 25 MG tablet, Take 0.5-1 tablets (12.5-25 mg total) by mouth every 6 (six) hours as needed for nausea or vomiting., Disp: 30 tablet, Rfl: 0 ?  promethazine-dextromethorphan (PROMETHAZINE-DM) 6.25-15 MG/5ML syrup, Take 5 mLs by mouth 4 (four) times daily as needed for cough., Disp: 118 mL, Rfl: 0 ?  sertraline (ZOLOFT) 100 MG tablet, TAKE 1.5 TABLETS ('150MG'$  TOTAL) BY MOUTH DAILY, Disp: 135 tablet, Rfl: 4 ?  sodium fluoride (FLUORISHIELD) 1.1 % GEL dental gel, See admin instructions., Disp: , Rfl:  ?  traZODone (DESYREL) 50 MG tablet, TAKE 1-2 TABLETS BY MOUTH  AT BEDTIME AS NEEDED FOR SLEEP., Disp: 180 tablet, Rfl: 0 ?  zoledronic acid (ZOMETA) 4 MG/5ML injection, Inject into the vein., Disp: , Rfl:  ?No current facility-administered medications for this visit. ? ?Facility-Administered Medications Ordered in Other Visits:  ?  leuprolide (LUPRON) injection 11.25 mg, 11.25 mg, Intramuscular, Q90 days, Sindy Guadeloupe, MD, 11.25 mg at 02/25/21 1532 ? ?Past Medical Problems: ?Past Medical History:  ?Diagnosis Date  ? Anemia   ? due to chemo  ? Anxiety   ? Asthma   ? Atrophic pancreas   ? Breast cancer ([place]) 04/2020  ? Chronic back pain   ? Depression   ? Family history of bladder cancer   ? Family history of breast cancer   ? Family history of colon cancer   ? GERD (gastroesophageal reflux disease) 04/2020  ? probably due to stress with cancer diagnosis  ? History of kidney stones   ? right kidney currently  ? Insomnia   ? Seizure ([place]) 1999  ? 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 and no further episodes  ? ? ?Past Surgical History: ?Past Surgical History:  ?Procedure Laterality Date  ? ABDOMINAL HYSTERECTOMY    ? APPENDECTOMY    ? BREAST BIOPSY Right 04/02/2020  ? [place] bx, ribbon, marker, invasive mamm  ? BREAST RECONSTRUCTION WITH PLACEMENT OF TISSUE EXPANDER AND  FLEX HD (ACELLULAR HYDRATED DERMIS) Bilateral 09/14/2020  ? Procedure: IMMEDIATE BILATERAL BREAST RECONSTRUCTION WITH PLACEMENT OF TISSUE EXPANDER AND FLEX HD (ACELLULAR HYDRATED DERMIS) ORDERED 10/22;  [facility]

## 2022-02-07 ENCOUNTER — Encounter (HOSPITAL_BASED_OUTPATIENT_CLINIC_OR_DEPARTMENT_OTHER): Payer: Self-pay | Admitting: Plastic Surgery

## 2022-02-07 ENCOUNTER — Other Ambulatory Visit: Payer: Self-pay

## 2022-02-11 ENCOUNTER — Encounter (HOSPITAL_BASED_OUTPATIENT_CLINIC_OR_DEPARTMENT_OTHER)
Admission: RE | Admit: 2022-02-11 | Discharge: 2022-02-11 | Disposition: A | Discharge status: Home / Self Care | Payer: Commercial Managed Care - PPO | Source: Ambulatory Visit | Attending: Plastic Surgery | Admitting: Plastic Surgery

## 2022-02-11 DIAGNOSIS — Z01812 Encounter for preprocedural laboratory examination: Secondary | ICD-10-CM | POA: Diagnosis present

## 2022-02-11 DIAGNOSIS — Z79899 Other long term (current) drug therapy: Secondary | ICD-10-CM | POA: Insufficient documentation

## 2022-02-11 LAB — BASIC METABOLIC PANEL
Anion gap: 7 (ref 5–15)
BUN: 13 mg/dL (ref 6–20)
CO2: 24 mmol/L (ref 22–32)
Calcium: 9.8 mg/dL (ref 8.9–10.3)
Chloride: 108 mmol/L (ref 98–111)
Creatinine, Ser: 0.75 mg/dL (ref 0.44–1.00)
GFR, Estimated: >60 mL/min (ref >60)
Glucose, Bld: 103 mg/dL — ABNORMAL HIGH (ref 70–99)
Potassium: 4 mmol/L (ref 3.5–5.1)
Sodium: 139 mmol/L (ref 135–145)

## 2022-02-15 NOTE — Anesthesia Preprocedure Evaluation (Addendum)
Anesthesia Evaluation  ?Patient identified by MRN, date of birth, ID band ?Patient awake ? ? ? ?Reviewed: ?Allergy & Precautions, NPO status , Patient's Chart, lab work & pertinent test results ? ?Airway ?Mallampati: I ? ?TM Distance: >3 FB ?Neck ROM: Full ? ? ? Dental ? ?(+) Teeth Intact, Dental Advisory Given ?  ?Pulmonary ?asthma ,  ?  ?Pulmonary exam normal ?breath sounds clear to auscultation ? ? ? ? ? ? Cardiovascular ?hypertension, Pt. on medications ?Normal cardiovascular exam ?Rhythm:Regular Rate:Normal ? ? ?  ?Neuro/Psych ?Seizures -,  PSYCHIATRIC DISORDERS Anxiety Depression   ? GI/Hepatic ?Neg liver ROS, GERD  ,  ?Endo/Other  ?Obesity ? ? Renal/GU ?negative Renal ROS  ? ?  ?Musculoskeletal ? ?(+) Arthritis ,  ? Abdominal ?  ?Peds ? Hematology ?negative hematology ROS ?(+)   ?Anesthesia Other Findings ?H/o breast cancer ? ? Reproductive/Obstetrics ? ?  ? ? ? ? ? ? ? ? ? ? ? ? ? ?  ?  ? ? ? ? ? ? ? ?Anesthesia Physical ?Anesthesia Plan ? ?ASA: 2 ? ?Anesthesia Plan: General  ? ?Post-op Pain Management: Tylenol PO (pre-op)*  ? ?Induction: Intravenous ? ?PONV Risk Score and Plan: 3 and Midazolam, Dexamethasone, Ondansetron and Scopolamine patch - Pre-op ? ?Airway Management Planned: LMA ? ?Additional Equipment:  ? ?Intra-op Plan:  ? ?Post-operative Plan: Extubation in OR ? ?Informed Consent: I have reviewed the patients History and Physical, chart, labs and discussed the procedure including the risks, benefits and alternatives for the proposed anesthesia with the patient or authorized representative who has indicated his/her understanding and acceptance.  ? ? ? ?Dental advisory given ? ?Plan Discussed with: CRNA ? ?Anesthesia Plan Comments:   ? ? ? ? ?Anesthesia Quick Evaluation ? ?

## 2022-02-16 ENCOUNTER — Encounter: Payer: Self-pay | Admitting: Oncology

## 2022-02-16 ENCOUNTER — Ambulatory Visit (HOSPITAL_BASED_OUTPATIENT_CLINIC_OR_DEPARTMENT_OTHER): Payer: Medicaid Other | Admitting: Anesthesiology

## 2022-02-16 ENCOUNTER — Other Ambulatory Visit: Payer: Self-pay

## 2022-02-16 ENCOUNTER — Ambulatory Visit (HOSPITAL_BASED_OUTPATIENT_CLINIC_OR_DEPARTMENT_OTHER)
Admission: RE | Admit: 2022-02-16 | Discharge: 2022-02-16 | Disposition: A | Discharge status: Home / Self Care | Payer: Medicaid Other | Source: Ambulatory Visit | Attending: Plastic Surgery | Admitting: Plastic Surgery

## 2022-02-16 ENCOUNTER — Encounter (HOSPITAL_BASED_OUTPATIENT_CLINIC_OR_DEPARTMENT_OTHER): Payer: Self-pay | Admitting: Plastic Surgery

## 2022-02-16 ENCOUNTER — Encounter (HOSPITAL_BASED_OUTPATIENT_CLINIC_OR_DEPARTMENT_OTHER)
Admission: RE | Disposition: A | Discharge status: Home / Self Care | Payer: Self-pay | Source: Ambulatory Visit | Attending: Plastic Surgery

## 2022-02-16 DIAGNOSIS — F419 Anxiety disorder, unspecified: Secondary | ICD-10-CM | POA: Diagnosis not present

## 2022-02-16 DIAGNOSIS — Z9013 Acquired absence of bilateral breasts and nipples: Secondary | ICD-10-CM | POA: Diagnosis not present

## 2022-02-16 DIAGNOSIS — Z6834 Body mass index (BMI) 34.0-34.9, adult: Secondary | ICD-10-CM | POA: Diagnosis not present

## 2022-02-16 DIAGNOSIS — E669 Obesity, unspecified: Secondary | ICD-10-CM | POA: Insufficient documentation

## 2022-02-16 DIAGNOSIS — F32A Depression, unspecified: Secondary | ICD-10-CM | POA: Insufficient documentation

## 2022-02-16 DIAGNOSIS — I10 Essential (primary) hypertension: Secondary | ICD-10-CM | POA: Diagnosis not present

## 2022-02-16 DIAGNOSIS — J45909 Unspecified asthma, uncomplicated: Secondary | ICD-10-CM | POA: Insufficient documentation

## 2022-02-16 DIAGNOSIS — Z79899 Other long term (current) drug therapy: Secondary | ICD-10-CM | POA: Insufficient documentation

## 2022-02-16 DIAGNOSIS — N6489 Other specified disorders of breast: Secondary | ICD-10-CM | POA: Diagnosis not present

## 2022-02-16 DIAGNOSIS — N651 Disproportion of reconstructed breast: Secondary | ICD-10-CM

## 2022-02-16 DIAGNOSIS — Z853 Personal history of malignant neoplasm of breast: Secondary | ICD-10-CM | POA: Insufficient documentation

## 2022-02-16 HISTORY — PX: LIPOSUCTION WITH LIPOFILLING: SHX6436

## 2022-02-16 HISTORY — DX: Essential (primary) hypertension: I10

## 2022-02-16 SURGERY — LIPOSUCTION, WITH FAT TRANSFER
Anesthesia: General | Site: Breast | Laterality: Bilateral

## 2022-02-16 MED ORDER — ACETAMINOPHEN 500 MG PO TABS
ORAL_TABLET | ORAL | Status: AC
  Filled 2022-02-16: qty 2

## 2022-02-16 MED ORDER — SCOPOLAMINE 1 MG/3DAYS TD PT72
MEDICATED_PATCH | TRANSDERMAL | Status: AC
Start: 2022-02-16 — End: ?
  Filled 2022-02-16: qty 1

## 2022-02-16 MED ORDER — SODIUM CHLORIDE 0.9% FLUSH: 3.0000 mL | Freq: Two times a day (BID) | INTRAVENOUS | Status: DC

## 2022-02-16 MED ORDER — CEFAZOLIN SODIUM-DEXTROSE 2-4 GM/100ML-% IV SOLN
2.0000 g | INTRAVENOUS | Status: AC
  Administered 2022-02-16: 2 g via INTRAVENOUS

## 2022-02-16 MED ORDER — FENTANYL CITRATE (PF) 100 MCG/2ML IJ SOLN
INTRAMUSCULAR | Status: AC
  Filled 2022-02-16: qty 2

## 2022-02-16 MED ORDER — LIDOCAINE 2% (20 MG/ML) 5 ML SYRINGE
INTRAMUSCULAR | Status: AC
  Filled 2022-02-16: qty 5

## 2022-02-16 MED ORDER — PROPOFOL 500 MG/50ML IV EMUL
INTRAVENOUS | Status: AC
  Filled 2022-02-16: qty 50

## 2022-02-16 MED ORDER — FENTANYL CITRATE (PF) 100 MCG/2ML IJ SOLN: 25.0000 ug | INTRAMUSCULAR | Status: DC | PRN

## 2022-02-16 MED ORDER — BUPIVACAINE-EPINEPHRINE (PF) 0.25% -1:200000 IJ SOLN
INTRAMUSCULAR | Status: AC
  Filled 2022-02-16: qty 120

## 2022-02-16 MED ORDER — ONDANSETRON HCL 4 MG/2ML IJ SOLN: 4.0000 mg | Freq: Once | INTRAMUSCULAR | Status: DC | PRN

## 2022-02-16 MED ORDER — MIDAZOLAM HCL 5 MG/5ML IJ SOLN
INTRAMUSCULAR | Status: DC | PRN
  Administered 2022-02-16: 2 mg via INTRAVENOUS

## 2022-02-16 MED ORDER — CEFAZOLIN SODIUM-DEXTROSE 2-4 GM/100ML-% IV SOLN
INTRAVENOUS | Status: AC
  Filled 2022-02-16: qty 100

## 2022-02-16 MED ORDER — LIDOCAINE HCL (CARDIAC) PF 100 MG/5ML IV SOSY
PREFILLED_SYRINGE | INTRAVENOUS | Status: DC | PRN
  Administered 2022-02-16: 80 mg via INTRAVENOUS

## 2022-02-16 MED ORDER — PROPOFOL 10 MG/ML IV BOLUS
INTRAVENOUS | Status: DC | PRN
  Administered 2022-02-16: 150 mg via INTRAVENOUS

## 2022-02-16 MED ORDER — DEXAMETHASONE SODIUM PHOSPHATE 4 MG/ML IJ SOLN
INTRAMUSCULAR | Status: DC | PRN
  Administered 2022-02-16: 10 mg via INTRAVENOUS

## 2022-02-16 MED ORDER — LACTATED RINGERS IV SOLN: INTRAVENOUS | Status: DC

## 2022-02-16 MED ORDER — FENTANYL CITRATE (PF) 100 MCG/2ML IJ SOLN
INTRAMUSCULAR | Status: DC | PRN
  Administered 2022-02-16 (×2): 50 ug via INTRAVENOUS

## 2022-02-16 MED ORDER — ACETAMINOPHEN 500 MG PO TABS
1000.0000 mg | ORAL_TABLET | Freq: Once | ORAL | Status: AC
Start: 2022-02-16 — End: 2022-02-16
  Administered 2022-02-16: 1000 mg via ORAL

## 2022-02-16 MED ORDER — EPINEPHRINE PF 1 MG/ML IJ SOLN
INTRAMUSCULAR | Status: AC
  Filled 2022-02-16: qty 1

## 2022-02-16 MED ORDER — ACETAMINOPHEN 325 MG RE SUPP: 650.0000 mg | RECTAL | Status: DC | PRN

## 2022-02-16 MED ORDER — OXYCODONE HCL 5 MG PO TABS: 5.0000 mg | ORAL_TABLET | ORAL | Status: DC | PRN

## 2022-02-16 MED ORDER — LIDOCAINE-EPINEPHRINE 1 %-1:100000 IJ SOLN
INTRAMUSCULAR | Status: AC
  Filled 2022-02-16: qty 1

## 2022-02-16 MED ORDER — MIDAZOLAM HCL 2 MG/2ML IJ SOLN
INTRAMUSCULAR | Status: AC
  Filled 2022-02-16: qty 2

## 2022-02-16 MED ORDER — ONDANSETRON HCL 4 MG/2ML IJ SOLN
INTRAMUSCULAR | Status: DC | PRN
  Administered 2022-02-16: 4 mg via INTRAVENOUS

## 2022-02-16 MED ORDER — LIDOCAINE HCL (PF) 1 % IJ SOLN
INTRAMUSCULAR | Status: AC
  Filled 2022-02-16: qty 60

## 2022-02-16 MED ORDER — ACETAMINOPHEN 325 MG PO TABS: 650.0000 mg | ORAL_TABLET | ORAL | Status: DC | PRN

## 2022-02-16 MED ORDER — LIDOCAINE HCL 1 % IJ SOLN
INTRAVENOUS | Status: DC | PRN
  Administered 2022-02-16: 1000 mL

## 2022-02-16 MED ORDER — DEXAMETHASONE SODIUM PHOSPHATE 10 MG/ML IJ SOLN
INTRAMUSCULAR | Status: AC
  Filled 2022-02-16: qty 1

## 2022-02-16 MED ORDER — SCOPOLAMINE 1 MG/3DAYS TD PT72SCOPOLAMINE 1 MG/3DAYS
1.0000 | MEDICATED_PATCH | Freq: Once | TRANSDERMAL | Status: DC
Start: 2022-02-16 — End: 2022-02-16
  Administered 2022-02-16: 1.5 mg via TRANSDERMAL

## 2022-02-16 MED ORDER — FENTANYL CITRATE (PF) 100 MCG/2ML IJ SOLN
25.0000 ug | INTRAMUSCULAR | Status: DC | PRN
  Administered 2022-02-16 (×2): 50 ug via INTRAVENOUS

## 2022-02-16 MED ORDER — ONDANSETRON HCL 4 MG/2ML IJ SOLN
INTRAMUSCULAR | Status: AC
  Filled 2022-02-16: qty 2

## 2022-02-16 MED ORDER — SODIUM CHLORIDE 0.9% FLUSH: 3.0000 mL | INTRAVENOUS | Status: DC | PRN

## 2022-02-16 MED ORDER — SODIUM CHLORIDE 0.9 % IV SOLN: 250.0000 mL | INTRAVENOUS | Status: DC | PRN

## 2022-02-16 MED ORDER — CHLORHEXIDINE GLUCONATE CLOTH 2 % EX PADS: 6.0000 | MEDICATED_PAD | Freq: Once | CUTANEOUS | Status: DC

## 2022-02-16 SURGICAL SUPPLY — 58 items
ADH SKN CLS APL DERMABOND .7 (GAUZE/BANDAGES/DRESSINGS) ×1
BINDER ABDOMINAL  9 SM 30-45 (SOFTGOODS)
BINDER ABDOMINAL 10 UNV 27-48 (MISCELLANEOUS) IMPLANT
BINDER ABDOMINAL 12 SM 30-45 (SOFTGOODS) ×1 IMPLANT
BINDER ABDOMINAL 9 SM 30-45 (SOFTGOODS) IMPLANT
BINDER BREAST LRG (GAUZE/BANDAGES/DRESSINGS) IMPLANT
BINDER BREAST MEDIUM (GAUZE/BANDAGES/DRESSINGS) IMPLANT
BINDER BREAST XLRG (GAUZE/BANDAGES/DRESSINGS) IMPLANT
BINDER BREAST XXLRG (GAUZE/BANDAGES/DRESSINGS) ×1 IMPLANT
BLADE HEX COATED 2.75 (ELECTRODE) IMPLANT
BLADE SURG 15 STRL LF DISP TIS (BLADE) ×1 IMPLANT
BLADE SURG 15 STRL SS (BLADE) ×2
COVER BACK TABLE 60X90IN (DRAPES) ×2 IMPLANT
COVER MAYO STAND STRL (DRAPES) ×2 IMPLANT
DERMABOND ADVANCED (GAUZE/BANDAGES/DRESSINGS) ×2
DERMABOND ADVANCED .7 DNX12 (GAUZE/BANDAGES/DRESSINGS) ×1 IMPLANT
DRAPE LAPAROSCOPIC ABDOMINAL (DRAPES) ×2 IMPLANT
DRSG PAD ABDOMINAL 8X10 ST (GAUZE/BANDAGES/DRESSINGS) ×6 IMPLANT
ELECT REM PT RETURN 9FT ADLT (ELECTROSURGICAL) ×2
ELECTRODE REM PT RTRN 9FT ADLT (ELECTROSURGICAL) ×1 IMPLANT
EXTRACTOR CANIST REVOLVE STRL (CANNISTER) ×2 IMPLANT
GLOVE BIO SURGEON STRL SZ 6.5 (GLOVE) ×2 IMPLANT
GLOVE BIOGEL PI IND STRL 7.0 (GLOVE) IMPLANT
GLOVE BIOGEL PI IND STRL 7.5 (GLOVE) IMPLANT
GLOVE BIOGEL PI INDICATOR 7.0 (GLOVE) ×2
GLOVE BIOGEL PI INDICATOR 7.5 (GLOVE) ×2
GLOVE SURG SS PI 6.5 STRL IVOR (GLOVE) ×3 IMPLANT
GLOVE SURG SYN 7.5  E (GLOVE) ×2
GLOVE SURG SYN 7.5 E (GLOVE) ×1 IMPLANT
GLOVE SURG SYN 7.5 PF PI (GLOVE) IMPLANT
GOWN STRL REUS W/ TWL LRG LVL3 (GOWN DISPOSABLE) ×2 IMPLANT
GOWN STRL REUS W/ TWL XL LVL3 (GOWN DISPOSABLE) IMPLANT
GOWN STRL REUS W/TWL LRG LVL3 (GOWN DISPOSABLE) ×4
GOWN STRL REUS W/TWL XL LVL3 (GOWN DISPOSABLE) ×2
IV LACTATED RINGERS 1000ML (IV SOLUTION) ×4 IMPLANT
LINER CANISTER 1000CC FLEX (MISCELLANEOUS) ×4 IMPLANT
NDL HYPO 25X1 1.5 SAFETY (NEEDLE) ×1 IMPLANT
NEEDLE HYPO 25X1 1.5 SAFETY (NEEDLE) ×2 IMPLANT
PACK BASIN DAY SURGERY FS (CUSTOM PROCEDURE TRAY) ×2 IMPLANT
PAD ALCOHOL SWAB (MISCELLANEOUS) ×2 IMPLANT
PAD FOAM SILICONE BACKED (GAUZE/BANDAGES/DRESSINGS) ×1 IMPLANT
PENCIL SMOKE EVACUATOR (MISCELLANEOUS) ×1 IMPLANT
SLEEVE SCD COMPRESS KNEE MED (STOCKING) ×2 IMPLANT
SPIKE FLUID TRANSFER (MISCELLANEOUS) IMPLANT
SPONGE T-LAP 18X18 ~~LOC~~+RFID (SPONGE) ×2 IMPLANT
STRIP CLOSURE SKIN 1/2X4 (GAUZE/BANDAGES/DRESSINGS) ×1 IMPLANT
SUT MNCRL AB 4-0 PS2 18 (SUTURE) IMPLANT
SUT MON AB 5-0 PS2 18 (SUTURE) ×4 IMPLANT
SYR 10ML LL (SYRINGE) ×9 IMPLANT
SYR 3ML 18GX1 1/2 (SYRINGE) IMPLANT
SYR 50ML LL SCALE MARK (SYRINGE) ×2 IMPLANT
SYR CONTROL 10ML LL (SYRINGE) ×2 IMPLANT
SYR TOOMEY 50ML (SYRINGE) IMPLANT
TOWEL GREEN STERILE FF (TOWEL DISPOSABLE) ×4 IMPLANT
TRAY DSU PREP LF (CUSTOM PROCEDURE TRAY) ×2 IMPLANT
TUBING INFILTRATION IT-10001 (TUBING) ×1 IMPLANT
TUBING SET GRADUATE ASPIR 12FT (MISCELLANEOUS) ×2 IMPLANT
UNDERPAD 30X36 HEAVY ABSORB (UNDERPADS AND DIAPERS) ×4 IMPLANT

## 2022-02-16 NOTE — Addendum Note (Signed)
Addendum  created 02/16/22 1223 by Maryella Shivers, CRNA  ? Charge Capture section accepted  ?  ?

## 2022-02-16 NOTE — Anesthesia Procedure Notes (Signed)
Procedure Name: LMA Insertion ?Date/Time: 02/16/2022 8:40 AM ?Performed by: Maryella Shivers, CRNA ?Pre-anesthesia Checklist: Patient identified, Emergency Drugs available, Suction available and Patient being monitored ?Patient Re-evaluated:Patient Re-evaluated prior to induction ?Oxygen Delivery Method: Circle system utilized ?Preoxygenation: Pre-oxygenation with 100% oxygen ?Induction Type: IV induction ?Ventilation: Mask ventilation without difficulty ?LMA: LMA inserted ?LMA Size: 4.0 ?Number of attempts: 1 ?Airway Equipment and Method: Bite block ?Placement Confirmation: positive ETCO2 ?Tube secured with: Tape ?Dental Injury: Teeth and Oropharynx as per pre-operative assessment  ? ? ? ? ?

## 2022-02-16 NOTE — Anesthesia Postprocedure Evaluation (Signed)
Anesthesia Post Note ? ?Patient: [name] ? ?Procedure(s) Performed: LIPOSUCTION WITH LIPOFILLING TO BILATERAL BREASTS (Bilateral: Breast) ? ?  ? ?Patient location during evaluation: PACU ?Anesthesia Type: General ?Level of consciousness: awake and alert ?Pain management: pain level controlled ?Vital Signs Assessment: post-procedure vital signs reviewed and stable ?Respiratory status: spontaneous breathing, nonlabored ventilation, respiratory function stable and patient connected to nasal cannula oxygen ?Cardiovascular status: blood pressure returned to baseline and stable ?Postop Assessment: no apparent nausea or vomiting ?Anesthetic complications: no ? ? ?No notable events documented. ? ?Last Vitals:  ?Vitals:  ? 02/16/22 1019 02/16/22 1030  ?BP:  134/83  ?Pulse: 94 69  ?Resp: 12 10  ?Temp:    ?SpO2: 100% 96%  ?  ?Last Pain:  ?Vitals:  ? 02/16/22 1030  ?TempSrc:   ?PainSc: 4   ? ? ?  ?  ?  ?  ?  ?  ? ?[place] ? ? ? ? ?

## 2022-02-16 NOTE — Transfer of Care (Signed)
Immediate Anesthesia Transfer of Care Note ? ?Patient: [name] ? ?Procedure(s) Performed: LIPOSUCTION WITH LIPOFILLING TO BILATERAL BREASTS (Bilateral: Breast) ? ?Patient Location: PACU ? ?Anesthesia Type:General ? ?Level of Consciousness: sedated ? ?Airway & Oxygen Therapy: Patient Spontanous Breathing and Patient connected to face mask oxygen ? ?Post-op Assessment: Report given to RN and Post -op Vital signs reviewed and stable ? ?Post vital signs: Reviewed and stable ? ?Last Vitals:  ?Vitals Value Taken Time  ?BP 126/81 02/16/22 1003  ?Temp    ?Pulse 81 02/16/22 1005  ?Resp 16 02/16/22 1005  ?SpO2 99 % 02/16/22 1005  ?Vitals shown include unvalidated device data. ? ?Last Pain:  ?Vitals:  ? 02/16/22 0725  ?TempSrc: Oral  ?PainSc: 6   ?   ? ?Patients Stated Pain Goal: 3 (02/16/22 0725) ? ?Complications: No notable events documented. ?

## 2022-02-16 NOTE — Op Note (Signed)
DATE OF OPERATION: 02/16/2022 ? ?LOCATION: [facility] ? ?PREOPERATIVE DIAGNOSIS: Breast asymmetry after reconstruction for breast cancer ? ?POSTOPERATIVE DIAGNOSIS: Same ? ?PROCEDURE: Lipofilling of bilateral breasts for symmetry ? ?SURGEON: Brandace Cargle Sanger Asako Saliba, DO ? ?ASSISTANT: Donnamarie Rossetti, PA ? ?EBL: none ? ?CONDITION: Stable ? ?COMPLICATIONS: None ? ?INDICATION: The patient, [name], is a 41 y.o. female born on [birth year], is here for treatment of breast asymmetry.  ? ?PROCEDURE DETAILS:  ?The patient was seen prior to surgery and marked.  The IV antibiotics were given. The patient was taken to the operating room and given a general anesthetic. A standard time out was performed and all information was confirmed by those in the room. SCDs were placed.   The abdomen and breast were prepped and draped.  Lidocaine with epinephrine was injected into the tumescent infusion sites of the abdomen.  The tumescent was infused.  After waiting for the tumescent to take effect the liposuction was performed.   The Revolve was used to collect the adipose tissue.  The manufacture guidelines were used to prepare the adipose. The #15 blade was used to make an incision on the medial aspect of the right breast and the lateral aspect of the left breast.  The fat was placed into the medial aspect of the right breast 50 cc.  The adipose was placed in the medial and superior aspect of the left breast superficial to the muscle 150 cc.  The incision was closed with the 5-0 Monocryl.  Derma bond and steri strips were applied.  The patient was placed in a breast binder and abdominal binder.  The patient was allowed to wake up and taken to recovery room in stable condition at the end of the case. The family was notified at the end of the case.  ? ?The advanced practice practitioner (APP) assisted throughout the case.  The APP was essential in retraction and counter traction when needed to make the case progress  smoothly.  This retraction and assistance made it possible to see the tissue plans for the procedure.  The assistance was needed for blood control, tissue re-approximation and assisted with closure of the incision site. ? ?

## 2022-02-16 NOTE — Interval H&P Note (Signed)
History and Physical Interval Note: ? ?02/16/2022 ?7:27 AM ? ?[name]  has presented today for surgery, with the diagnosis of Malignant neoplasm of upper-outer quadrant of right breast in female.  The various methods of treatment have been discussed with the patient and family. After consideration of risks, benefits and other options for treatment, the patient has consented to  Procedure(s): ?LIPOSUCTION WITH LIPOFILLING (Bilateral) as a surgical intervention.  The patient's history has been reviewed, patient examined, no change in status, stable for surgery.  I have reviewed the patient's chart and labs.  Questions were answered to the patient's satisfaction.   ? ? ?[name] ? ? ?

## 2022-02-16 NOTE — Discharge Instructions (Addendum)
INSTRUCTIONS FOR AFTER BREAST SURGERY   You will likely have some questions about what to expect following your operation.  The following information will help you and your family understand what to expect when you are discharged from the hospital.  Following these guidelines will help ensure a smooth recovery and reduce risks of complications.  Postoperative instructions include information on: diet, wound care, medications and physical activity.  AFTER SURGERY Expect to go home after the procedure.  In some cases, you may need to spend one night in the hospital for observation.  DIET Breast surgery does not require a specific diet.  However, the healthier you eat the better your body can start healing. It is important to increasing your protein intake.  This means limiting the foods with sugar and carbohydrates.  Focus on vegetables and some meat.  If you have any liposuction during your procedure be sure to drink water.  If your urine is bright yellow, then it is concentrated, and you need to drink more water.  As a general rule after surgery, you should have 8 ounces of water every hour while awake.  If you find you are persistently nauseated or unable to take in liquids let us know.  NO TOBACCO USE or EXPOSURE.  This will slow your healing process and increase the risk of a wound.  WOUND CARE Leave the ACE wrap or binder on for 3 days . Use fragrance free soap.   After 3 days you can remove the ACE wrap or binder to shower. Once dry apply ACE wrap, binder or sports bra.  Use a mild soap like Dial, Dove and Ivory. You may have Topifoam or Lipofoam on.  It is soft and spongy and helps keep you from getting creases if you have liposuction.  This can be removed before the shower and then replaced.  If you need more it is available on [employer] (Lipofoam). If you have steri-strips / tape directly attached to your skin leave them in place. It is OK to get these wet.   No baths, pools or hot tubs for four  weeks. We close your incision to leave the smallest and best-looking scar. No ointment or creams on your incisions until given the go ahead.  Especially not Neosporin (Too many skin reactions with this one).  A few weeks after surgery you can use Mederma and start massaging the scar. We ask you to wear your binder or sports bra for the first 6 weeks around the clock, including while sleeping. This provides added comfort and helps reduce the fluid accumulation at the surgery site.  ACTIVITY No heavy lifting until cleared by the doctor.  This usually means no more than a half-gallon of milk.  It is OK to walk and climb stairs. In fact, moving your legs is very important to decrease your risk of a blood clot.  It will also help keep you from getting deconditioned.  Every 1 to 2 hours get up and walk for 5 minutes. This will help with a quicker recovery back to normal.  Let pain be your guide so you don't do too much.  This is not the time for spring cleaning and don't plan on taking care of anyone else.  This time is for you to recover,  You will be more comfortable if you sleep and rest with your head elevated either with a few pillows under you or in a recliner.  No stomach sleeping for a three months.  WORK Everyone   returns to work at different times. As a rough guide, most people take at least 1 - 2 weeks off prior to returning to work. If you need documentation for your job, bring the forms to your postoperative follow up visit. ? ?DRIVING ?Arrange for someone to bring you home from the hospital.  You may be able to drive a few days after surgery but not while taking any narcotics or valium. ? ?BOWEL MOVEMENTS ?Constipation can occur after anesthesia and while taking pain medication.  It is important to stay ahead for your comfort.  We recommend taking Milk of Magnesia (2 tablespoons; twice a day) while taking the pain pills. ? ?MEDICATIONS You may be prescribed should start after surgery ?At your  preoperative visit for you history and physical you may have been given the following medications: ?An antibiotic: Start this medication when you get home and take according to the instructions on the bottle. ?Zofran 4 mg:  This is to treat nausea and vomiting.  You can take this every 6 hours as needed and only if needed. ?Valium 2 mg: This is for muscle tightness if you have an implant or expander. This will help relax your muscle which also helps with pain control.  This can be taken every 12 hours as needed. Don't drive after taking this medication. ?Norco (hydrocodone/acetaminophen) 5/325 mg:  This is only to be used after you have taken the motrin or the tylenol. Every 8 hours as needed. ? ? ?Over the counter Medication to take: ?Ibuprofen (Motrin) 600 mg:  Take this every 6 hours.  If you have additional pain then take 500 mg of the tylenol every 8 hours.  Only take the Norco after you have tried these two. No Tylenol until after 1:30pm today. ?Miralax or stool softener of choice: Take this according to the bottle if you take the Norco. ? ?WHEN TO CALL ?Call your surgeon's office if any of the following occur: ?Fever 101 degrees F or greater ?Excessive bleeding or fluid from the incision site. ?Pain that increases over time without aid from the medications ?Redness, warmth, or pus draining from incision sites ?Persistent nausea or inability to take in liquids ?Severe misshapen area that underwent the operation. ? ?Post Anesthesia Home Care Instructions ? ?Activity: ?Get plenty of rest for the remainder of the day. A responsible individual must stay with you for 24 hours following the procedure.  ?For the next 24 hours, DO NOT: ?-Drive a car ?-Paediatric nurse ?-Drink alcoholic beverages ?-Take any medication unless instructed by your physician ?-Make any legal decisions or sign important papers. ? ?Meals: ?Start with liquid foods such as gelatin or soup. Progress to regular foods as tolerated. Avoid greasy,  spicy, heavy foods. If nausea and/or vomiting occur, drink only clear liquids until the nausea and/or vomiting subsides. Call your physician if vomiting continues. ? ?Special Instructions/Symptoms: ?Your throat may feel dry or sore from the anesthesia or the breathing tube placed in your throat during surgery. If this causes discomfort, gargle with warm salt water. The discomfort should disappear within 24 hours. ? ?If you had a scopolamine patch placed behind your ear for the management of post- operative nausea and/or vomiting: ? ?1. The medication in the patch is effective for 72 hours, after which it should be removed.  Wrap patch in a tissue and discard in the trash. Wash hands thoroughly with soap and water. ?2. You may remove the patch earlier than 72 hours if you experience unpleasant side effects which  may include dry mouth, dizziness or visual disturbances. ?3. Avoid touching the patch. Wash your hands with soap and water after contact with the patch. ?    ?

## 2022-02-17 ENCOUNTER — Encounter (HOSPITAL_BASED_OUTPATIENT_CLINIC_OR_DEPARTMENT_OTHER): Payer: Self-pay | Admitting: Plastic Surgery

## 2022-02-17 ENCOUNTER — Encounter: Payer: Self-pay | Admitting: Plastic Surgery

## 2022-02-22 ENCOUNTER — Other Ambulatory Visit: Payer: Self-pay | Admitting: *Deleted

## 2022-02-22 MED ORDER — SERTRALINE HCL 100 MG PO TABS: ORAL_TABLET | ORAL | 4 refills | Status: DC

## 2022-02-24 ENCOUNTER — Encounter: Payer: Self-pay | Admitting: Oncology

## 2022-02-25 ENCOUNTER — Encounter: Payer: Self-pay | Admitting: Oncology

## 2022-02-25 ENCOUNTER — Ambulatory Visit (INDEPENDENT_AMBULATORY_CARE_PROVIDER_SITE_OTHER): Payer: BLUE CROSS/BLUE SHIELD | Admitting: Plastic Surgery

## 2022-02-25 ENCOUNTER — Encounter: Payer: Self-pay | Admitting: Plastic Surgery

## 2022-02-25 DIAGNOSIS — N651 Disproportion of reconstructed breast: Secondary | ICD-10-CM

## 2022-02-25 DIAGNOSIS — C50411 Malignant neoplasm of upper-outer quadrant of right female breast: Secondary | ICD-10-CM

## 2022-02-25 DIAGNOSIS — Z170 Estrogen receptor positive status [ER+]: Secondary | ICD-10-CM

## 2022-02-25 DIAGNOSIS — N6489 Other specified disorders of breast: Secondary | ICD-10-CM

## 2022-02-25 NOTE — Progress Notes (Signed)
? ?  Subjective:  ? ? Patient ID: [name], female    DOB: 1981/04/08, 41 y.o.   MRN: [id number] ? ?The patient is a 41 yrs old female here for follow up after undergoing fat grafting for breast asymmetry a week and a half ago.  She is doing well and looks great.  She has some bruising and swelling as expected.  Her pain is improving and is well managed.  No signs of redness or infection. ? ? ? ?Review of Systems  ?Constitutional: Negative.   ?Eyes: Negative.   ?Respiratory: Negative.    ?Cardiovascular: Negative.   ?Gastrointestinal: Negative.   ?Endocrine: Negative.   ?Genitourinary: Negative.   ? ?   ?Objective:  ? Physical Exam ?Vitals reviewed.  ?Constitutional:   ?   Appearance: Normal appearance.  ?HENT:  ?   Head: Normocephalic and atraumatic.  ?Cardiovascular:  ?   Rate and Rhythm: Normal rate.  ?   Pulses: Normal pulses.  ?Pulmonary:  ?   Effort: Pulmonary effort is normal.  ?Abdominal:  ?   General: There is no distension.  ?   Palpations: Abdomen is soft.  ?   Tenderness: There is abdominal tenderness.  ?Skin: ?   Capillary Refill: Capillary refill takes less than 2 seconds.  ?   Coloration: Skin is not jaundiced.  ?   Findings: Bruising present. No lesion.  ?Neurological:  ?   Mental Status: She is alert.  ?Psychiatric:     ?   Mood and Affect: Mood normal.     ?   Thought Content: Thought content normal.  ? ? ?   ?Assessment & Plan:  ? ?  ICD-10-CM   ?1. Malignant neoplasm of upper-outer quadrant of right breast in female, estrogen receptor positive ([place])  C50.411   ? Z17.0   ?  ?2. Breast asymmetry  N64.89   ?  ?  ?Continue with the sports bra and binder.  Follow-up in 2 weeks.  We will get a picture at her next appointment.  Call with any questions or concerns. ?

## 2022-03-02 ENCOUNTER — Inpatient Hospital Stay: Payer: BLUE CROSS/BLUE SHIELD

## 2022-03-02 ENCOUNTER — Encounter: Payer: Self-pay | Admitting: Oncology

## 2022-03-02 ENCOUNTER — Inpatient Hospital Stay: Payer: BLUE CROSS/BLUE SHIELD | Attending: Oncology

## 2022-03-02 ENCOUNTER — Inpatient Hospital Stay (HOSPITAL_BASED_OUTPATIENT_CLINIC_OR_DEPARTMENT_OTHER): Payer: BLUE CROSS/BLUE SHIELD | Admitting: Oncology

## 2022-03-02 VITALS — BP 119/86 | HR 100 | Temp 98.0°F | Resp 20 | Wt 220.5 lb

## 2022-03-02 DIAGNOSIS — Z9013 Acquired absence of bilateral breasts and nipples: Secondary | ICD-10-CM | POA: Insufficient documentation

## 2022-03-02 DIAGNOSIS — C50511 Malignant neoplasm of lower-outer quadrant of right female breast: Secondary | ICD-10-CM | POA: Diagnosis not present

## 2022-03-02 DIAGNOSIS — Z08 Encounter for follow-up examination after completed treatment for malignant neoplasm: Secondary | ICD-10-CM | POA: Diagnosis not present

## 2022-03-02 DIAGNOSIS — Z170 Estrogen receptor positive status [ER+]: Secondary | ICD-10-CM | POA: Diagnosis not present

## 2022-03-02 DIAGNOSIS — Z800 Family history of malignant neoplasm of digestive organs: Secondary | ICD-10-CM | POA: Insufficient documentation

## 2022-03-02 DIAGNOSIS — Z7983 Long term (current) use of bisphosphonates: Secondary | ICD-10-CM | POA: Diagnosis not present

## 2022-03-02 DIAGNOSIS — Z79818 Long term (current) use of other agents affecting estrogen receptors and estrogen levels: Secondary | ICD-10-CM | POA: Diagnosis not present

## 2022-03-02 DIAGNOSIS — Z79811 Long term (current) use of aromatase inhibitors: Secondary | ICD-10-CM | POA: Insufficient documentation

## 2022-03-02 DIAGNOSIS — R200 Anesthesia of skin: Secondary | ICD-10-CM | POA: Diagnosis not present

## 2022-03-02 DIAGNOSIS — Z8052 Family history of malignant neoplasm of bladder: Secondary | ICD-10-CM | POA: Insufficient documentation

## 2022-03-02 DIAGNOSIS — Z807 Family history of other malignant neoplasms of lymphoid, hematopoietic and related tissues: Secondary | ICD-10-CM | POA: Diagnosis not present

## 2022-03-02 DIAGNOSIS — Z5181 Encounter for therapeutic drug level monitoring: Secondary | ICD-10-CM | POA: Diagnosis not present

## 2022-03-02 DIAGNOSIS — Z9221 Personal history of antineoplastic chemotherapy: Secondary | ICD-10-CM | POA: Insufficient documentation

## 2022-03-02 DIAGNOSIS — Z853 Personal history of malignant neoplasm of breast: Secondary | ICD-10-CM

## 2022-03-02 DIAGNOSIS — Z5111 Encounter for antineoplastic chemotherapy: Secondary | ICD-10-CM | POA: Insufficient documentation

## 2022-03-02 LAB — CBC WITH DIFFERENTIAL/PLATELET
Abs Immature Granulocytes: 0.02 10*3/uL (ref 0.00–0.07)
Basophils Absolute: 0 10*3/uL (ref 0.0–0.1)
Basophils Relative: 1 %
Eosinophils Absolute: 0.1 10*3/uL (ref 0.0–0.5)
Eosinophils Relative: 2 %
HCT: 40.2 % (ref 36.0–46.0)
Hemoglobin: 13.8 g/dL (ref 12.0–15.0)
Immature Granulocytes: 0 %
Lymphocytes Relative: 39 %
Lymphs Abs: 2.3 10*3/uL (ref 0.7–4.0)
MCH: 29.7 pg (ref 26.0–34.0)
MCHC: 34.3 g/dL (ref 30.0–36.0)
MCV: 86.5 fL (ref 80.0–100.0)
Monocytes Absolute: 0.3 10*3/uL (ref 0.1–1.0)
Monocytes Relative: 5 %
Neutro Abs: 3.2 10*3/uL (ref 1.7–7.7)
Neutrophils Relative %: 53 %
Platelets: 292 10*3/uL (ref 150–400)
RBC: 4.65 MIL/uL (ref 3.87–5.11)
RDW: 12.3 % (ref 11.5–15.5)
WBC: 6 10*3/uL (ref 4.0–10.5)
nRBC: 0 % (ref 0.0–0.2)

## 2022-03-02 LAB — COMPREHENSIVE METABOLIC PANEL
ALT: 31 U/L (ref 0–44)
AST: 46 U/L — ABNORMAL HIGH (ref 15–41)
Albumin: 4.5 g/dL (ref 3.5–5.0)
Alkaline Phosphatase: 65 U/L (ref 38–126)
Anion gap: 11 (ref 5–15)
BUN: 14 mg/dL (ref 6–20)
CO2: 24 mmol/L (ref 22–32)
Calcium: 9.6 mg/dL (ref 8.9–10.3)
Chloride: 105 mmol/L (ref 98–111)
Creatinine, Ser: 0.9 mg/dL (ref 0.44–1.00)
GFR, Estimated: >60 mL/min (ref >60)
Glucose, Bld: 150 mg/dL — ABNORMAL HIGH (ref 70–99)
Potassium: 3.4 mmol/L — ABNORMAL LOW (ref 3.5–5.1)
Sodium: 140 mmol/L (ref 135–145)
Total Bilirubin: 0.6 mg/dL (ref 0.3–1.2)
Total Protein: 7.7 g/dL (ref 6.5–8.1)

## 2022-03-02 MED ORDER — ZOLEDRONIC ACID 4 MG/100ML IV SOLN
4.0000 mg | Freq: Once | INTRAVENOUS | Status: AC
  Administered 2022-03-02: 4 mg via INTRAVENOUS
  Filled 2022-03-02: qty 100

## 2022-03-02 MED ORDER — SODIUM CHLORIDE 0.9 % IV SOLN
INTRAVENOUS | Status: DC
  Filled 2022-03-02: qty 250

## 2022-03-02 MED ORDER — LEUPROLIDE ACETATE (3 MONTH) 11.25 MG IM KIT
11.2500 mg | PACK | INTRAMUSCULAR | Status: AC
  Administered 2022-03-02: 11.25 mg via INTRAMUSCULAR
  Filled 2022-03-02: qty 11.25

## 2022-03-05 ENCOUNTER — Encounter: Payer: Self-pay | Admitting: Oncology
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
## 2022-03-07 ENCOUNTER — Other Ambulatory Visit: Payer: Self-pay

## 2022-03-07 MED ORDER — PREGABALIN 75 MG PO CAPS: 75.0000 mg | ORAL_CAPSULE | Freq: Two times a day (BID) | ORAL | 1 refills | Status: DC

## 2022-03-07 NOTE — Telephone Encounter (Signed)
Lets switch her to lyrica 75 mg BID from gabapentin

## 2022-03-16 ENCOUNTER — Ambulatory Visit (INDEPENDENT_AMBULATORY_CARE_PROVIDER_SITE_OTHER): Payer: BLUE CROSS/BLUE SHIELD | Admitting: Surgical

## 2022-03-16 DIAGNOSIS — N6489 Other specified disorders of breast: Secondary | ICD-10-CM

## 2022-03-16 DIAGNOSIS — Z170 Estrogen receptor positive status [ER+]: Secondary | ICD-10-CM

## 2022-03-16 DIAGNOSIS — C50411 Malignant neoplasm of upper-outer quadrant of right female breast: Secondary | ICD-10-CM

## 2022-03-16 NOTE — Progress Notes (Unsigned)
Patient is a 41 year old female here for follow-up fat grafting to bilateral breasts with Dr. Marla Roe on 02/16/2022.  She reports overall she is doing well after fat grafting to bilateral breast, she has noticed an improvement in the shape of bilateral medial breast.  She reports that she has her tattoo scheduled for August, she is excited about this.  She is not having any complications at this time.  She is hopeful that the fat that was grafted will take and not dissolve as it has in the past.  She has some sutures that are bothering her.  Chaperone present on exam On exam bilateral breast incisions intact, abdominal incisions intact.  No bruising or swelling is noted.  No drainage is noted.  Medial breast with good volume noted, she does have some animation deformity, however much improved.  Recommend following up in August at her tattoo appointment, we did discuss that if she feels as if the fat reabsorbs prior to that appointment and she is interested in discussing additional options that we could schedule an appointment sooner.  We discussed some details about the tattoo such as timeline for each subsequent tattoo and number of sessions that may be required.  All of her questions were answered to her content.  No signs of infection.  Recommend calling with questions or concerns.  Pictures were obtained of the patient and placed in the chart with the patient's or guardian's permission.

## 2022-05-09 ENCOUNTER — Other Ambulatory Visit: Payer: Self-pay

## 2022-05-10 ENCOUNTER — Encounter: Payer: Self-pay | Admitting: Oncology

## 2022-05-10 NOTE — Telephone Encounter (Signed)
error 

## 2022-05-11 ENCOUNTER — Other Ambulatory Visit: Payer: Self-pay

## 2022-05-11 ENCOUNTER — Other Ambulatory Visit: Payer: Self-pay | Admitting: Oncology

## 2022-05-12 ENCOUNTER — Other Ambulatory Visit: Payer: Self-pay

## 2022-05-16 ENCOUNTER — Encounter: Payer: Self-pay | Admitting: Oncology

## 2022-05-16 NOTE — Progress Notes (Signed)
NIPPLE AREOLAR TATTOO PROCEDURE  PREOPERATIVE DIAGNOSIS:  Acquired absence of bilateral nipple areolar   POSTOPERATIVE DIAGNOSIS: Acquired absence of bilateral nipple areolar    PROCEDURES: Bilateral nipple areolar tattoo   ANESTHESIA:  EMLA, 4% Lidocaine with epinephrine  COMPLICATIONS: None.  JUSTIFICATION FOR PROCEDURE:  [name] is a 41 y.o. female with a history of breast cancer status post breast reconstruction. The patient presents for nipple areolar complex tattoo. Risks, benefits, indications, and alternatives of the above described procedures were discussed with the patient and all the patient's questions were answered.   She has a history of bilateral mastectomies with expander reconstruction and ultimate implant exchange performed 01/06/2021 by Dr. Marla Roe.  At the beginning of the encounter, we discussed her goals for nipple areolar tattooing.  She had considered possible options including non-areolar postmastectomy tattoos, but reports that she has been eagerly awaiting her opportunity for nipple areolar tattooing.  With regard to possible colors, she states that she would like to be similar to how she was prior to her bilateral mastectomies.  Reviewed images from 07/2020.  She states that her only preference is that the color is slightly less pink and more tan/darker than how she was preoperatively.  We also discussed positioning of her nipples and have decided that they would be higher and more centered on the breast than they were preoperatively.  She is more of a Fitzpatrick 1-2, but has recently dyed her hair dark brown/black and has been going to a tanning bed.    DESCRIPTION OF PROCEDURE: After verbal informed consent was obtained and proper identification of patient and surgical site was made, the patient was taken to the procedure room and placed supine on the clinic room reclining chair. A time out was performed to confirm patient's identity and surgical site. The  patient was prepped and draped in the usual sterile fashion. Alcohol swabs were used. Attention was turned to the selection of flesh colored permanent tattoo ink to produce the appropriate color for nipple areolar complex tattooing.  Nipple position was then chosen with patient.  Using a digital revo 7R tattoo head, pointillism pigment was instilled to the designed nipple areolar complex which was confirmed preoperatively with the patient using the Digital-Pop Deluxe machine by [employer]. Spiral technique was used to fill out the areola.  Nipple shading was then performed using 53F needle, half-circle underneath the nipple.  Once adequate pigment had been applied to the nipple areolar complex, Xeroform dressing was applied followed by 4 x 4 gauze and secured with Medipore tape. There were no complications and the procedure was tolerated without difficulty.    Areola-color: Bright Peach (2), Fair Honey(2), Tan Honey (2), and Tan Peach (1) in 2:2:2:1 ratio respectively. Additional: Dark Tan and Dark Honey in 1:1 ratio.   Nipples are each approximately 2.4 cm diameter at conclusion of visit. Return in 8-12 weeks for 2 hour scheduled follow-up tattoo session for repeat areolar tattooing as well as  Montgomery tubercles and possible nipple coloring.  She tells me that she is interested in having the nipples darker than the rest of the areolas, but will reassess after this initial session and with anticipated fading.  Picture(s) obtained of the patient and placed in the chart were with the patient's or guardian's permission.   Colors EXP: 10/2024

## 2022-05-18 ENCOUNTER — Encounter: Payer: Self-pay | Admitting: Oncology

## 2022-05-20 ENCOUNTER — Ambulatory Visit (INDEPENDENT_AMBULATORY_CARE_PROVIDER_SITE_OTHER): Payer: 59 | Admitting: Physician Assistant

## 2022-05-20 DIAGNOSIS — Z9013 Acquired absence of bilateral breasts and nipples: Secondary | ICD-10-CM

## 2022-05-20 DIAGNOSIS — Z170 Estrogen receptor positive status [ER+]: Secondary | ICD-10-CM

## 2022-05-24 ENCOUNTER — Other Ambulatory Visit: Payer: Self-pay

## 2022-05-31 ENCOUNTER — Other Ambulatory Visit: Payer: Self-pay

## 2022-06-01 ENCOUNTER — Encounter: Payer: Self-pay | Admitting: Oncology

## 2022-06-01 MED ORDER — PREGABALIN 75 MG PO CAPS: 75.0000 mg | ORAL_CAPSULE | Freq: Three times a day (TID) | ORAL | 1 refills | Status: DC

## 2022-06-02 ENCOUNTER — Telehealth: Payer: Self-pay | Admitting: *Deleted

## 2022-06-02 ENCOUNTER — Other Ambulatory Visit: Payer: Self-pay | Admitting: *Deleted

## 2022-06-02 ENCOUNTER — Inpatient Hospital Stay: Payer: Medicaid Other | Attending: Oncology

## 2022-06-02 DIAGNOSIS — Z5111 Encounter for antineoplastic chemotherapy: Secondary | ICD-10-CM | POA: Diagnosis present

## 2022-06-02 DIAGNOSIS — Z170 Estrogen receptor positive status [ER+]: Secondary | ICD-10-CM | POA: Diagnosis not present

## 2022-06-02 DIAGNOSIS — C50411 Malignant neoplasm of upper-outer quadrant of right female breast: Secondary | ICD-10-CM

## 2022-06-02 DIAGNOSIS — Z5181 Encounter for therapeutic drug level monitoring: Secondary | ICD-10-CM

## 2022-06-02 DIAGNOSIS — R002 Palpitations: Secondary | ICD-10-CM | POA: Insufficient documentation

## 2022-06-02 DIAGNOSIS — Z6834 Body mass index (BMI) 34.0-34.9, adult: Secondary | ICD-10-CM | POA: Insufficient documentation

## 2022-06-02 DIAGNOSIS — C50811 Malignant neoplasm of overlapping sites of right female breast: Secondary | ICD-10-CM | POA: Insufficient documentation

## 2022-06-02 DIAGNOSIS — M5416 Radiculopathy, lumbar region: Secondary | ICD-10-CM | POA: Insufficient documentation

## 2022-06-02 MED ORDER — GOSERELIN ACETATE 3.6 MG ~~LOC~~ IMPL
3.6000 mg | DRUG_IMPLANT | Freq: Once | SUBCUTANEOUS | Status: AC
  Administered 2022-06-02: 3.6 mg via SUBCUTANEOUS
  Filled 2022-06-02: qty 3.6

## 2022-06-02 MED ORDER — LEUPROLIDE ACETATE (3 MONTH) 11.25 MG IM KIT: 11.2500 mg | PACK | Freq: Once | INTRAMUSCULAR | Status: DC

## 2022-06-02 MED ORDER — PREGABALIN 75 MG PO CAPS: 75.0000 mg | ORAL_CAPSULE | Freq: Three times a day (TID) | ORAL | 1 refills | Status: DC

## 2022-06-02 MED ORDER — GOSERELIN ACETATE 3.6 MG ~~LOC~~ IMPL
3.6000 mg | DRUG_IMPLANT | Freq: Once | SUBCUTANEOUS | Status: DC
  Filled 2022-06-02: qty 3.6

## 2022-06-02 NOTE — Telephone Encounter (Signed)
The pt. Came in to the cancer center and the injection was for Zoladex. She has never had it and in the notes from her last visit with Dr. Janese Banks it states in 3 months to get next Lupron. Under the treatment plan had Zoladex. The lupron was history. We spoke to [facility] in pharmacy to help. He sees tthat Sonia Baller the pharmacist had put the order in 8/4 and Dr. Rogue Bussing was the MD that said it is ok.We added Seth Bake the authorization to see if we could give her the lupron and she said "no auth for lupron and then she said that lupron is no longer preferred for her insurance and it was changed to Zoldex byt Dr. Rogue Bussing and that is how the injection was put in. 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  Also she wanted a refill of the lyrica and I asked Dr. Grayland Ormond if he would fill it and he agreed and I put the order in and sent it to him for approval. Pt aware it was approved

## 2022-06-02 NOTE — Addendum Note (Signed)
Addended by: Charlyn Minerva on: 06/02/2022 03:50 PM   Modules accepted: Orders

## 2022-06-22 ENCOUNTER — Encounter: Payer: Self-pay | Admitting: Oncology

## 2022-06-24 DIAGNOSIS — M47816 Spondylosis without myelopathy or radiculopathy, lumbar region: Secondary | ICD-10-CM | POA: Diagnosis not present

## 2022-06-24 DIAGNOSIS — M47817 Spondylosis without myelopathy or radiculopathy, lumbosacral region: Secondary | ICD-10-CM | POA: Diagnosis not present

## 2022-06-29 DIAGNOSIS — M47816 Spondylosis without myelopathy or radiculopathy, lumbar region: Secondary | ICD-10-CM | POA: Diagnosis not present

## 2022-07-05 ENCOUNTER — Other Ambulatory Visit: Payer: Self-pay | Admitting: Geriatric Medicine

## 2022-07-05 DIAGNOSIS — R1032 Left lower quadrant pain: Secondary | ICD-10-CM

## 2022-07-06 ENCOUNTER — Encounter: Payer: Self-pay | Admitting: Oncology

## 2022-07-06 ENCOUNTER — Ambulatory Visit: Admission: RE | Admit: 2022-07-06 | Payer: Medicaid Other | Source: Ambulatory Visit

## 2022-07-11 ENCOUNTER — Other Ambulatory Visit: Payer: Self-pay | Admitting: Oncology

## 2022-07-13 ENCOUNTER — Ambulatory Visit
Admission: RE | Admit: 2022-07-13 | Discharge: 2022-07-13 | Disposition: A | Discharge status: Home / Self Care | Payer: BC Managed Care – PPO | Source: Ambulatory Visit | Attending: Geriatric Medicine | Admitting: Geriatric Medicine

## 2022-07-13 DIAGNOSIS — R1032 Left lower quadrant pain: Secondary | ICD-10-CM | POA: Diagnosis not present

## 2022-07-13 DIAGNOSIS — R1030 Lower abdominal pain, unspecified: Secondary | ICD-10-CM | POA: Diagnosis not present

## 2022-07-13 DIAGNOSIS — K760 Fatty (change of) liver, not elsewhere classified: Secondary | ICD-10-CM | POA: Diagnosis not present

## 2022-07-13 MED ORDER — IOHEXOL 300 MG/ML  SOLN
100.0000 mL | Freq: Once | INTRAMUSCULAR | Status: AC | PRN
  Administered 2022-07-13: 100 mL via INTRAVENOUS

## 2022-07-14 ENCOUNTER — Encounter: Payer: Self-pay | Admitting: Oncology

## 2022-07-28 ENCOUNTER — Encounter: Payer: Self-pay | Admitting: Oncology

## 2022-07-29 ENCOUNTER — Ambulatory Visit (INDEPENDENT_AMBULATORY_CARE_PROVIDER_SITE_OTHER): Payer: BC Managed Care – PPO | Admitting: Physician Assistant

## 2022-07-29 ENCOUNTER — Other Ambulatory Visit: Payer: Self-pay | Admitting: Geriatric Medicine

## 2022-07-29 DIAGNOSIS — Z170 Estrogen receptor positive status [ER+]: Secondary | ICD-10-CM

## 2022-07-29 DIAGNOSIS — C50411 Malignant neoplasm of upper-outer quadrant of right female breast: Secondary | ICD-10-CM | POA: Diagnosis not present

## 2022-07-29 DIAGNOSIS — Z9013 Acquired absence of bilateral breasts and nipples: Secondary | ICD-10-CM

## 2022-07-29 DIAGNOSIS — R1032 Left lower quadrant pain: Secondary | ICD-10-CM

## 2022-07-29 NOTE — Progress Notes (Signed)
NIPPLE AREOLAR TATTOO PROCEDURE   PREOPERATIVE DIAGNOSIS:  Acquired absence of bilateral nipple areolar    POSTOPERATIVE DIAGNOSIS: Acquired absence of bilateral nipple areolar     PROCEDURES: Bilateral nipple areolar tattoo    ANESTHESIA:  EMLA, 4% Lidocaine with epinephrine   COMPLICATIONS: None.   JUSTIFICATION FOR PROCEDURE:  [name] is a 41 y.o. female with a history of breast cancer status post breast reconstruction. The patient presents for nipple areolar complex tattoo. Risks, benefits, indications, and alternatives of the above described procedures were discussed with the patient and all the patient's questions were answered.   05/20/2022  She has a history of bilateral mastectomies with expander reconstruction and ultimate implant exchange performed 01/06/2021 by Dr. Marla Roe.  At the beginning of the encounter, we discussed her goals for nipple areolar tattooing.  She had considered possible options including non-areolar postmastectomy tattoos, but reports that she has been eagerly awaiting her opportunity for nipple areolar tattooing.    With regard to possible colors, she states that she would like to be similar to how she was prior to her bilateral mastectomies.  Reviewed images from 07/2020.  She states that her only preference is that the color is slightly less pink and more tan/darker than how she was preoperatively.  We also discussed positioning of her nipples and have decided that they would be higher and more centered on the breast than they were preoperatively.  She is more of a Fitzpatrick 1-2, but has recently dyed her hair dark brown/black and has been going to a tanning bed.    07/29/2022 Patient reports that she has had considerable fading of the areola, but the inferior shading remained prominent.  She was quite pleased with the color at the conclusion of her last visit and is not looking to make changes with color of areola.  She is hoping that she can have the  nipple color done today.  Discussed slightly darker shade than the areola to add contrast.  Additionally, discussed placement of Montgomery tubercles for added detail.  Given the prominence of inferior nipple shading from initial encounter, will not add any additional shading today.  Overall, she is pleased.    DESCRIPTION OF PROCEDURE: After verbal informed consent was obtained and proper identification of patient and surgical site was made, the patient was taken to the procedure room and placed supine on the clinic room reclining chair. A time out was performed to confirm patient's identity and surgical site. The patient was prepped and draped in the usual sterile fashion. Alcohol swabs were used. Attention was turned to the selection of flesh colored permanent tattoo ink to produce the appropriate color for nipple areolar complex tattooing.  Nipple position was then chosen with patient.  Using a digital revo 7R tattoo head, pointillism pigment was instilled to the designed nipple areolar complex which was confirmed preoperatively with the patient using the Digital-Pop Deluxe machine by [employer]. Spiral technique was used to fill out the areola.  Nipple shading was then performed using 55F needle, half-circle underneath the nipple.  Once adequate pigment had been applied to the nipple areolar complex, Xeroform dressing was applied followed by 4 x 4 gauze and secured with Medipore tape. There were no complications and the procedure was tolerated without difficulty.     Areola-color: Bright Peach (2), Fair Honey(2), Tan Honey (2), and Tan Peach (1) in 2:2:2:1 ratio respectively. Nipple color: Tan Honey, Bright Peach, and Tan Peach in 1:1:1 ratio. Montgomery Tubercles: Bright Peach (2),  Fair Honey (2), and Portrait White (1) in 2:2:1 ratio respectively. Inferior nipple shading: Dark Tan and Dark Honey in 1:1 ratio.    NACs are each approximately 4 cm diameter at conclusion of visit.  Return in  8-12 weeks for 1.5 hour scheduled follow-up tattoo session for likely additional areolar tattooing as well as repeating the nipple and Montgomery tubercle tattooing   Picture(s) obtained of the patient and placed in the chart were with the patient's or guardian's permission.     Colors EXP: 10/2024

## 2022-07-31 ENCOUNTER — Encounter: Payer: Self-pay | Admitting: Oncology

## 2022-08-01 ENCOUNTER — Other Ambulatory Visit: Payer: Self-pay

## 2022-08-01 MED ORDER — PREGABALIN 75 MG PO CAPS: 75.0000 mg | ORAL_CAPSULE | Freq: Three times a day (TID) | ORAL | 2 refills | Status: DC

## 2022-08-11 ENCOUNTER — Emergency Department
Admission: EM | Admit: 2022-08-11 | Discharge: 2022-08-11 | Disposition: A | Discharge status: Home / Self Care | Payer: BC Managed Care – PPO | Attending: Emergency Medicine | Admitting: Emergency Medicine

## 2022-08-11 ENCOUNTER — Encounter: Payer: Self-pay | Admitting: Emergency Medicine

## 2022-08-11 ENCOUNTER — Other Ambulatory Visit: Payer: Self-pay

## 2022-08-11 ENCOUNTER — Emergency Department: Payer: BC Managed Care – PPO

## 2022-08-11 DIAGNOSIS — R109 Unspecified abdominal pain: Secondary | ICD-10-CM | POA: Diagnosis not present

## 2022-08-11 DIAGNOSIS — K439 Ventral hernia without obstruction or gangrene: Secondary | ICD-10-CM | POA: Diagnosis not present

## 2022-08-11 DIAGNOSIS — N200 Calculus of kidney: Secondary | ICD-10-CM | POA: Diagnosis not present

## 2022-08-11 DIAGNOSIS — R1010 Upper abdominal pain, unspecified: Secondary | ICD-10-CM

## 2022-08-11 DIAGNOSIS — R1012 Left upper quadrant pain: Secondary | ICD-10-CM | POA: Diagnosis not present

## 2022-08-11 DIAGNOSIS — I10 Essential (primary) hypertension: Secondary | ICD-10-CM | POA: Diagnosis not present

## 2022-08-11 DIAGNOSIS — N390 Urinary tract infection, site not specified: Secondary | ICD-10-CM | POA: Insufficient documentation

## 2022-08-11 DIAGNOSIS — K429 Umbilical hernia without obstruction or gangrene: Secondary | ICD-10-CM | POA: Diagnosis not present

## 2022-08-11 DIAGNOSIS — K760 Fatty (change of) liver, not elsewhere classified: Secondary | ICD-10-CM | POA: Diagnosis not present

## 2022-08-11 LAB — CBC
HCT: 40.2 % (ref 36.0–46.0)
Hemoglobin: 13.8 g/dL (ref 12.0–15.0)
MCH: 29.4 pg (ref 26.0–34.0)
MCHC: 34.3 g/dL (ref 30.0–36.0)
MCV: 85.7 fL (ref 80.0–100.0)
Platelets: 269 10*3/uL (ref 150–400)
RBC: 4.69 MIL/uL (ref 3.87–5.11)
RDW: 12.5 % (ref 11.5–15.5)
WBC: 6.1 10*3/uL (ref 4.0–10.5)
nRBC: 0 % (ref 0.0–0.2)

## 2022-08-11 LAB — URINALYSIS, ROUTINE W REFLEX MICROSCOPIC
Bacteria, UA: NONE SEEN
Bilirubin Urine: NEGATIVE
Glucose, UA: NEGATIVE mg/dL
Hgb urine dipstick: NEGATIVE
Ketones, ur: NEGATIVE mg/dL
Nitrite: NEGATIVE
Protein, ur: NEGATIVE mg/dL
Specific Gravity, Urine: 1.024 (ref 1.005–1.030)
pH: 5 (ref 5.0–8.0)

## 2022-08-11 LAB — COMPREHENSIVE METABOLIC PANEL
ALT: 31 U/L (ref 0–44)
AST: 38 U/L (ref 15–41)
Albumin: 4.5 g/dL (ref 3.5–5.0)
Alkaline Phosphatase: 69 U/L (ref 38–126)
Anion gap: 9 (ref 5–15)
BUN: 14 mg/dL (ref 6–20)
CO2: 27 mmol/L (ref 22–32)
Calcium: 9.8 mg/dL (ref 8.9–10.3)
Chloride: 106 mmol/L (ref 98–111)
Creatinine, Ser: 0.7 mg/dL (ref 0.44–1.00)
GFR, Estimated: >60 mL/min (ref >60)
Glucose, Bld: 99 mg/dL (ref 70–99)
Potassium: 3.6 mmol/L (ref 3.5–5.1)
Sodium: 142 mmol/L (ref 135–145)
Total Bilirubin: 0.6 mg/dL (ref 0.3–1.2)
Total Protein: 7.7 g/dL (ref 6.5–8.1)

## 2022-08-11 LAB — LIPASE, BLOOD: Lipase: 35 U/L (ref 11–51)

## 2022-08-11 MED ORDER — NITROFURANTOIN MONOHYD MACRO 100 MG PO CAPS: 100.0000 mg | ORAL_CAPSULE | Freq: Two times a day (BID) | ORAL | 0 refills | Status: AC

## 2022-08-11 MED ORDER — ONDANSETRON HCL 4 MG/2ML IJ SOLN
4.0000 mg | Freq: Once | INTRAMUSCULAR | Status: AC
  Administered 2022-08-11: 4 mg via INTRAVENOUS
  Filled 2022-08-11: qty 2

## 2022-08-11 MED ORDER — SODIUM CHLORIDE 0.9 % IV BOLUS
1000.0000 mL | Freq: Once | INTRAVENOUS | Status: AC
  Administered 2022-08-11: 1000 mL via INTRAVENOUS

## 2022-08-11 MED ORDER — MORPHINE SULFATE (PF) 4 MG/ML IV SOLN
4.0000 mg | Freq: Once | INTRAVENOUS | Status: AC
  Administered 2022-08-11: 4 mg via INTRAVENOUS
  Filled 2022-08-11: qty 1

## 2022-08-11 NOTE — ED Notes (Signed)
Pt declined offer of warm blanket; stretcher locked low; rail up; call bell within reach; provider SF updated pt.

## 2022-08-11 NOTE — ED Triage Notes (Signed)
Pt with abdominal pain, left sided radiating up under ribs today and to the right. No n/v/d shob, chest pain, or urinary symptoms.  No fever/chills.,

## 2022-08-11 NOTE — ED Notes (Signed)
89 yof with a c/c of abdominal pain and nausea for the past two days. The pt also advises she has had some constipation. The pt is alert and oriented x 4, ambulatory to the room.

## 2022-08-11 NOTE — ED Notes (Signed)
Pt reports has known has kidney stone; states was dx a year ago; states they didn't want to do surgery and that it is still stuck in same area; states this pain feels different; states last BM's last week; states dec eating; nausea but no vomiting; urination baseline per pt; dark amber in color per pt; LUQ and travels into RUQ per pt; cramping sensation. 2 known hernias per pt. Tenderness at upper abdomen. Pt's resp reg/unlabored, skin dry and laying calmly on stretcher.

## 2022-08-11 NOTE — ED Provider Notes (Signed)
[facility] Provider Note    Event Date/Time   First MD Initiated Contact with Patient 08/11/22 1250     (approximate)   History   Abdominal Pain   HPI  [name] is a 41 y.o. female with history of recent kidney stone presents emergency department complaining of left sided abdominal pain.  Patient denies fever or chills.  No vomiting.  No diarrhea.  Pain radiates into the area underneath her ribs.  Patient has history of appendectomy and cholecystectomy along with hysterectomy      Physical Exam   Triage Vital Signs: ED Triage Vitals  Enc Vitals Group     BP 08/11/22 1207 (!) 134/92     Pulse Rate 08/11/22 1207 96     Resp 08/11/22 1207 18     Temp 08/11/22 1207 98.2 F (36.8 C)     Temp Source 08/11/22 1207 Oral     SpO2 08/11/22 1207 96 %     Weight 08/11/22 1226 220 lb 7.4 oz (100 kg)     Height 08/11/22 1226 '5\' 7"'$  (1.702 m)     Head Circumference --      Peak Flow --      Pain Score 08/11/22 1204 7     Pain Loc --      Pain Edu? --      Excl. in [place]? --     Most recent vital signs: Vitals:   08/11/22 1445 08/11/22 1454  BP:    Pulse: 62 (!) 59  Resp:    Temp:    SpO2: 97% 98%     General: Awake, no distress.   CV:  Good peripheral perfusion. regular rate and  rhythm Resp:  Normal effort. Lungs CTA Abd:  No distention.  Tender in the left flank/upper quadrant Other:      ED Results / Procedures / Treatments   Labs (all labs ordered are listed, but only abnormal results are displayed) Labs Reviewed  URINALYSIS, ROUTINE W REFLEX MICROSCOPIC - Abnormal; Notable for the following components:      Result Value   Color, Urine AMBER (*)    APPearance HAZY (*)    Leukocytes,Ua LARGE (*)    All other components within normal limits  URINE CULTURE  LIPASE, BLOOD  COMPREHENSIVE METABOLIC PANEL  CBC     EKG     RADIOLOGY CT renal stone    PROCEDURES:   Procedures   MEDICATIONS ORDERED IN  ED: Medications  morphine (PF) 4 MG/ML injection 4 mg (4 mg Intravenous Given 08/11/22 1421)  ondansetron (ZOFRAN) injection 4 mg (4 mg Intravenous Given 08/11/22 1420)  sodium chloride 0.9 % bolus 1,000 mL (1,000 mLs Intravenous New Bag/Given 08/11/22 1425)     IMPRESSION / MDM / ASSESSMENT AND PLAN / ED COURSE  I reviewed the triage vital signs and the nursing notes.                              Differential diagnosis includes, but is not limited to, infected kidney stone, pyelonephritis, UTI, SBO, hernia, splenic infarct  Patient's presentation is most consistent with acute complicated illness / injury requiring diagnostic workup.   Patient's labs for CBC metabolic panel and lipase are normal and reassuring   Patient's UA has a large amount of WBCs along with hyaline cast and no bacteria.  Due to her recent history of kidney stones that will need to be  removed did CT renal stone study to assess for infection  CT renal stone study independently reviewed and interpreted by me as being negative.  Did discuss these findings with patient.  There is no acute finding.  She does have findings of stable hernias.  There is a kidney stone lodged in the lower pole of the kidney.  No nephrolithiasis.  Patient be given 1 L normal saline along with morphine 4 mg IV and Zofran 4 mg IV.  We will recheck on patient after these medications.  If continues to remain same will be discharged home  FINAL CLINICAL IMPRESSION(S) / ED DIAGNOSES   Final diagnoses:  Pain of upper abdomen  Lower urinary tract infectious disease     Rx / DC Orders   ED Discharge Orders          Ordered    nitrofurantoin, macrocrystal-monohydrate, (MACROBID) 100 MG capsule  2 times daily        08/11/22 1431             Note:  This document was prepared using Dragon voice recognition software and may include unintentional dictation errors.    Versie Starks, PA-C 08/11/22 1504    Lavonia Drafts,  MD 08/11/22 1505

## 2022-08-12 DIAGNOSIS — G4700 Insomnia, unspecified: Secondary | ICD-10-CM | POA: Diagnosis not present

## 2022-08-12 DIAGNOSIS — F321 Major depressive disorder, single episode, moderate: Secondary | ICD-10-CM | POA: Diagnosis not present

## 2022-08-12 DIAGNOSIS — F419 Anxiety disorder, unspecified: Secondary | ICD-10-CM | POA: Diagnosis not present

## 2022-08-12 DIAGNOSIS — K439 Ventral hernia without obstruction or gangrene: Secondary | ICD-10-CM | POA: Diagnosis not present

## 2022-08-12 LAB — URINE CULTURE: Culture: NO GROWTH

## 2022-08-16 ENCOUNTER — Ambulatory Visit: Admission: RE | Admit: 2022-08-16 | Payer: BC Managed Care – PPO | Source: Ambulatory Visit

## 2022-08-17 DIAGNOSIS — K432 Incisional hernia without obstruction or gangrene: Secondary | ICD-10-CM

## 2022-08-17 DIAGNOSIS — K429 Umbilical hernia without obstruction or gangrene: Secondary | ICD-10-CM

## 2022-08-17 HISTORY — DX: Incisional hernia without obstruction or gangrene: K43.2

## 2022-08-17 HISTORY — DX: Umbilical hernia without obstruction or gangrene: K42.9

## 2022-08-22 ENCOUNTER — Ambulatory Visit: Payer: Self-pay | Admitting: Surgery

## 2022-08-22 DIAGNOSIS — K430 Incisional hernia with obstruction, without gangrene: Secondary | ICD-10-CM | POA: Diagnosis not present

## 2022-08-22 DIAGNOSIS — L720 Epidermal cyst: Secondary | ICD-10-CM | POA: Diagnosis not present

## 2022-08-22 DIAGNOSIS — K429 Umbilical hernia without obstruction or gangrene: Secondary | ICD-10-CM | POA: Diagnosis not present

## 2022-08-22 NOTE — H&P (View-Only) (Signed)
Subjective:   CC: Incisional hernia with obstruction but no gangrene [K43.0]  HPI:  [name] is a 41 y.o. female who retuns for evaluation of above. Symptoms were first noted several weeks ago. Pain is dull, confined to the LUQ, without radiation.  Associated with bulge, exacerbated by bending down.  Lump is reducible.   Also requesting two lumps on back removed at time of surgery. Asymptomatic but noticeable   Past Medical History:  has a past medical history of Anxiety, Asthma, unspecified asthma severity, unspecified whether complicated, unspecified whether persistent, Depression, History of cancer (04/02/2020), Hypertension, Menstrual disorder, unspecified, and Seizure disorder (CMS-HCC).  Past Surgical History:  Past Surgical History:  Procedure Laterality Date   CHOLECYSTECTOMY  03/03/2020   Robot I. Mailynn Everly   MASTECTOMY Right 05/07/2020   Partial mast w/ SN BX I. Areeb Corron   MASTECTOMY Right 05/28/2020   Re-excision + margins I [name]   MASTECTOMY SIMPLE Bilateral 09/14/2020   Total --- Dr Benjamine Sprague   APPENDECTOMY     CESAREAN SECTION     DILATION AND CURETTAGE, DIAGNOSTIC / THERAPEUTIC     HYSTERECTOMY      Family History: family history includes Anxiety in her mother; Breast cancer in an other family member; Colon cancer in an other family member; Depression in her mother; High blood pressure (Hypertension) in her father and another family member; Irregular Heart Beat (Arrhythmia) in her mother.  Social History:  reports that she has never smoked. She has never used smokeless tobacco. She reports current alcohol use. She reports that she does not use drugs.  Current Medications: has a current medication list which includes the following prescription(s): albuterol, alprazolam, biotin, bupropion, butalbital-acetaminophen-caffeine, calcium carbonate-vitamin d3, celecoxib, chlorhexidine, diclofenac, exemestane, fluoride (sodium), fluticasone propion-salmeterol,  hydrochlorothiazide, methocarbamol, multivitamin with minerals, pregabalin, prochlorperazine, promethazine, sertraline, stool softener, sumatriptan, trazodone, zoledronic acid, olanzapine, and ondansetron.  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   Latex Unknown 07/03/2014   Penicillin Unknown 07/03/2014   Shellfish containing products Hives 06/23/2015    ROS:  A 15 point review of systems was performed and pertinent positives and negatives noted in HPI   Objective:     BP 132/84   Pulse 94   Ht 170.2 cm ('5\' 7"'$ ) Comment: per patient  Wt 100.7 kg (222 lb) Comment: per patient  BMI 34.77 kg/m   Constitutional :  Alert, cooperative, no distress  Lymphatics/Throat:  Supple, no lymphadenopathy  Respiratory:  clear to auscultation bilaterally  Cardiovascular:  regular rate and rhythm  Gastrointestinal: soft, non-tender; bowel sounds normal; no masses,  no organomegaly. incisional hernia noted.  moderate, incarcerated, no overlying skin changes, and LUQ, around GB extraction site  Musculoskeletal: Steady gait and movement  Skin: Cool and moist, robo lap chole surgical scars. Epidermal cyst x2, size of dime in upper back left and right paraspinal, above tatoo, not infected, likely epidermal cyst  Psychiatric: Normal affect, non-agitated, not confused       LABS:  N/a   RADS: CLINICAL DATA:  Left flank pain   EXAM:  CT ABDOMEN AND PELVIS WITHOUT CONTRAST   TECHNIQUE:  Multidetector CT imaging of the abdomen and pelvis was performed  following the standard protocol without IV contrast.   RADIATION DOSE REDUCTION: This exam was performed according to the  departmental dose-optimization program which includes automated  exposure control, adjustment of the mA and/or kV according to  patient size and/or use  of iterative reconstruction technique.   COMPARISON:  07/13/2022   FINDINGS:  Lower  chest: No acute abnormality fall   Hepatobiliary: Prior cholecystectomy. Diffuse fatty infiltration of  the liver. No focal hepatic abnormality.   Pancreas: Complete fatty or placement. No visible significant  parenchymal tissue other than a small portion of the pancreatic  head.   Spleen: No focal abnormality.  Normal size.   Adrenals/Urinary Tract: 3 mm nonobstructing stone in the midpole of  the right kidney. No ureteral stones or hydronephrosis. No renal or  adrenal mass. Urinary bladder unremarkable.   Stomach/Bowel: Stomach, large and small bowel grossly unremarkable.   Vascular/Lymphatic: No evidence of aneurysm or adenopathy.   Reproductive: Prior hysterectomy.  No adnexal masses.   Other: No free fluid or free air. Left mid abdominal ventral hernia  containing fat, unchanged. Small umbilical hernia containing fat. No  free fluid or free air.   Musculoskeletal: No acute bony abnormality.   IMPRESSION:  Mild fatty liver.   Right nephrolithiasis.  No hydronephrosis.   Stable small umbilical hernia and moderate size left mid abdominal  ventral hernia containing fat.   No acute findings.    Electronically Signed    By: Rolm Baptise M.D.    On: 08/11/2022 13:53  Assessment:       Incisional hernia with obstruction but no gangrene [W58.0] Small umbilical hernia Epidermal cyst x2, upper back  Plan:     1. Incisional hernia with obstruction but no gangrene [K43.0]   Discussed the risk of surgery including recurrence, which can be up to 50% in the case of incisional or complex hernias, possible use of prosthetic materials (mesh) and the increased risk of mesh infxn if used, bleeding, chronic pain, post-op infxn, post-op SBO or ileus, and possible re-operation to address said risks. The risks of general anesthetic, if used, includes MI, CVA, sudden death or even reaction to anesthetic medications also discussed. Alternatives include continued observation.  Benefits  include possible symptom relief, prevention of incarceration, strangulation, enlargement in size over time, and the risk of emergency surgery in the face of strangulation.   Typical post-op recovery time of 3-5 days with 2 weeks of activity restrictions were also discussed.  ED return precautions given for sudden increase in pain, size of hernia with accompanying fever, nausea, and/or vomiting.  The patient verbalized understanding and all questions were answered to the patient's satisfaction.   2. Patient has elected to proceed with surgical treatment. Procedure will be scheduled. robotic assisted laparoscopic, will try to fix umbilical hernia at same time.  Will also remove two epidermal cyst on back PRIOR to hernia case, to avoid pressure on hernia repair site  labs/images/medications/previous chart entries reviewed personally and relevant changes/updates noted above.

## 2022-08-22 NOTE — H&P (Signed)
Subjective:   CC: Incisional hernia with obstruction but no gangrene [K43.0]  HPI:  [name] is a 41 y.o. female who retuns for evaluation of above. Symptoms were first noted several weeks ago. Pain is dull, confined to the LUQ, without radiation.  Associated with bulge, exacerbated by bending down.  Lump is reducible.   Also requesting two lumps on back removed at time of surgery. Asymptomatic but noticeable   Past Medical History:  has a past medical history of Anxiety, Asthma, unspecified asthma severity, unspecified whether complicated, unspecified whether persistent, Depression, History of cancer (04/02/2020), Hypertension, Menstrual disorder, unspecified, and Seizure disorder (CMS-HCC).  Past Surgical History:  Past Surgical History:  Procedure Laterality Date   CHOLECYSTECTOMY  03/03/2020   Robot I. Beulah Matusek   MASTECTOMY Right 05/07/2020   Partial mast w/ SN BX I. Alyss Granato   MASTECTOMY Right 05/28/2020   Re-excision + margins I [name]   MASTECTOMY SIMPLE Bilateral 09/14/2020   Total --- Dr Benjamine Sprague   APPENDECTOMY     CESAREAN SECTION     DILATION AND CURETTAGE, DIAGNOSTIC / THERAPEUTIC     HYSTERECTOMY      Family History: family history includes Anxiety in her mother; Breast cancer in an other family member; Colon cancer in an other family member; Depression in her mother; High blood pressure (Hypertension) in her father and another family member; Irregular Heart Beat (Arrhythmia) in her mother.  Social History:  reports that she has never smoked. She has never used smokeless tobacco. She reports current alcohol use. She reports that she does not use drugs.  Current Medications: has a current medication list which includes the following prescription(s): albuterol, alprazolam, biotin, bupropion, butalbital-acetaminophen-caffeine, calcium carbonate-vitamin d3, celecoxib, chlorhexidine, diclofenac, exemestane, fluoride (sodium), fluticasone propion-salmeterol,  hydrochlorothiazide, methocarbamol, multivitamin with minerals, pregabalin, prochlorperazine, promethazine, sertraline, stool softener, sumatriptan, trazodone, zoledronic acid, olanzapine, and ondansetron.  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   Latex Unknown 07/03/2014   Penicillin Unknown 07/03/2014   Shellfish containing products Hives 06/23/2015    ROS:  A 15 point review of systems was performed and pertinent positives and negatives noted in HPI   Objective:     BP 132/84   Pulse 94   Ht 170.2 cm ('5\' 7"'$ ) Comment: per patient  Wt 100.7 kg (222 lb) Comment: per patient  BMI 34.77 kg/m   Constitutional :  Alert, cooperative, no distress  Lymphatics/Throat:  Supple, no lymphadenopathy  Respiratory:  clear to auscultation bilaterally  Cardiovascular:  regular rate and rhythm  Gastrointestinal: soft, non-tender; bowel sounds normal; no masses,  no organomegaly. incisional hernia noted.  moderate, incarcerated, no overlying skin changes, and LUQ, around GB extraction site  Musculoskeletal: Steady gait and movement  Skin: Cool and moist, robo lap chole surgical scars. Epidermal cyst x2, size of dime in upper back left and right paraspinal, above tatoo, not infected, likely epidermal cyst  Psychiatric: Normal affect, non-agitated, not confused       LABS:  N/a   RADS: CLINICAL DATA:  Left flank pain   EXAM:  CT ABDOMEN AND PELVIS WITHOUT CONTRAST   TECHNIQUE:  Multidetector CT imaging of the abdomen and pelvis was performed  following the standard protocol without IV contrast.   RADIATION DOSE REDUCTION: This exam was performed according to the  departmental dose-optimization program which includes automated  exposure control, adjustment of the mA and/or kV according to  patient size and/or use  of iterative reconstruction technique.   COMPARISON:  07/13/2022   FINDINGS:  Lower  chest: No acute abnormality fall   Hepatobiliary: Prior cholecystectomy. Diffuse fatty infiltration of  the liver. No focal hepatic abnormality.   Pancreas: Complete fatty or placement. No visible significant  parenchymal tissue other than a small portion of the pancreatic  head.   Spleen: No focal abnormality.  Normal size.   Adrenals/Urinary Tract: 3 mm nonobstructing stone in the midpole of  the right kidney. No ureteral stones or hydronephrosis. No renal or  adrenal mass. Urinary bladder unremarkable.   Stomach/Bowel: Stomach, large and small bowel grossly unremarkable.   Vascular/Lymphatic: No evidence of aneurysm or adenopathy.   Reproductive: Prior hysterectomy.  No adnexal masses.   Other: No free fluid or free air. Left mid abdominal ventral hernia  containing fat, unchanged. Small umbilical hernia containing fat. No  free fluid or free air.   Musculoskeletal: No acute bony abnormality.   IMPRESSION:  Mild fatty liver.   Right nephrolithiasis.  No hydronephrosis.   Stable small umbilical hernia and moderate size left mid abdominal  ventral hernia containing fat.   No acute findings.    Electronically Signed    By: Rolm Baptise M.D.    On: 08/11/2022 13:53  Assessment:       Incisional hernia with obstruction but no gangrene [N05.3] Small umbilical hernia Epidermal cyst x2, upper back  Plan:     1. Incisional hernia with obstruction but no gangrene [K43.0]   Discussed the risk of surgery including recurrence, which can be up to 50% in the case of incisional or complex hernias, possible use of prosthetic materials (mesh) and the increased risk of mesh infxn if used, bleeding, chronic pain, post-op infxn, post-op SBO or ileus, and possible re-operation to address said risks. The risks of general anesthetic, if used, includes MI, CVA, sudden death or even reaction to anesthetic medications also discussed. Alternatives include continued observation.  Benefits  include possible symptom relief, prevention of incarceration, strangulation, enlargement in size over time, and the risk of emergency surgery in the face of strangulation.   Typical post-op recovery time of 3-5 days with 2 weeks of activity restrictions were also discussed.  ED return precautions given for sudden increase in pain, size of hernia with accompanying fever, nausea, and/or vomiting.  The patient verbalized understanding and all questions were answered to the patient's satisfaction.   2. Patient has elected to proceed with surgical treatment. Procedure will be scheduled. robotic assisted laparoscopic, will try to fix umbilical hernia at same time.  Will also remove two epidermal cyst on back PRIOR to hernia case, to avoid pressure on hernia repair site  labs/images/medications/previous chart entries reviewed personally and relevant changes/updates noted above.

## 2022-08-24 ENCOUNTER — Encounter: Payer: Self-pay | Admitting: Oncology

## 2022-08-30 ENCOUNTER — Encounter
Admission: RE | Admit: 2022-08-30 | Discharge: 2022-08-30 | Disposition: A | Discharge status: Home / Self Care | Payer: BC Managed Care – PPO | Source: Ambulatory Visit | Attending: Surgery | Admitting: Surgery

## 2022-08-30 NOTE — Patient Instructions (Addendum)
Your procedure is scheduled on: Friday, November 17 Report to the Registration Desk on the 1st floor of the [facility]. To find out your arrival time, please call [phone] between 1PM - 3PM on:  Thursday, November 16 If your arrival time is 6:00 am, do not arrive prior to that time as the [place] entrance doors do not open until 6:00 am.  REMEMBER: Instructions that are not followed completely may result in serious medical risk, up to and including death; or upon the discretion of your surgeon and anesthesiologist your surgery may need to be rescheduled.  Do not eat food after midnight the night before surgery.  No gum chewing, lozengers or hard candies.  You may however, drink CLEAR liquids up to 2 hours before you are scheduled to arrive for your surgery. Do not drink anything within 2 hours of your scheduled arrival time.  Clear liquids include: - water  - apple juice without pulp - gatorade (not RED colors) - black coffee or tea (Do NOT add milk or creamers to the coffee or tea) Do NOT drink anything that is not on this list.  TAKE THESE MEDICATIONS THE MORNING OF SURGERY WITH A SIP OF WATER:  Pregabalin (lyrica) Bupropion (Wellbutrin) Alprazolam (Xanax) if needed for anxiety 5.  Albuterol inhaler  and bring to the hospital.  One week prior to surgery: starting today, November 14 Stop Anti-inflammatories (NSAIDS) such as Advil, Aleve, Ibuprofen, Motrin, Naproxen, Naprosyn and Aspirin based products such as Excedrin, Goodys Powder, BC Powder. Stop ANY OVER THE COUNTER supplements until after surgery.  You may however, continue to take Tylenol if needed for pain up until the day of surgery.  No Alcohol for 24 hours before or after surgery.  No Smoking including e-cigarettes for 24 hours prior to surgery.  No chewable tobacco products for at least 6 hours prior to surgery.  No nicotine patches on the day of surgery.  Do not use any "recreational" drugs for at least  a week prior to your surgery.  Please be advised that the combination of cocaine and anesthesia may have negative outcomes, up to and including death. If you test positive for cocaine, your surgery will be cancelled.  On the morning of surgery brush your teeth with toothpaste and water, you may rinse your mouth with mouthwash if you wish. Do not swallow any toothpaste or mouthwash.  Use CHG Soap as directed on instruction sheet.  Do not wear jewelry, make-up, hairpins, clips or nail polish.  Do not wear lotions, powders, or perfumes.   Do not shave body from the neck down 48 hours prior to surgery just in case you cut yourself which could leave a site for infection.  Also, freshly shaved skin may become irritated if using the CHG soap.  Contact lenses, hearing aids and dentures may not be worn into surgery.  Do not bring valuables to the hospital. [facility] is not responsible for any missing/lost belongings or valuables.   Notify your doctor if there is any change in your medical condition (cold, fever, infection).  Wear comfortable clothing (specific to your surgery type) to the hospital.  After surgery, you can help prevent lung complications by doing breathing exercises.  Take deep breaths and cough every 1-2 hours. Your doctor may order a device called an Incentive Spirometer to help you take deep breaths. When coughing or sneezing, hold a pillow firmly against your incision with both hands. This is called "splinting." Doing this helps protect your  incision. It also decreases belly discomfort.  If you are being discharged the day of surgery, you will not be allowed to drive home. You will need a responsible adult (18 years or older) to drive you home and stay with you that night.   If you are taking public transportation, you will need to have a responsible adult (18 years or older) with you. Please confirm with your physician that it is acceptable to use public transportation.    Please call the [place] Dept. at [phone] if you have any questions about these instructions.  Surgery Visitation Policy:  Patients undergoing a surgery or procedure may have two family members or support persons with them as long as the person is not COVID-19 positive or experiencing its symptoms.   Inpatient Visitation:    Visiting hours are 7 a.m. to 8 p.m. Up to four visitors are allowed at one time in a patient room. The visitors may rotate out with other people during the day. One designated support person (adult) may remain overnight.  MASKING: Due to an increase in RSV rates and hospitalizations, starting Wednesday, Nov. 15, in patient care areas in which we serve newborns, infants and children, masks will be required for teammates and visitors.  Children ages 11 and under may not visit. This policy affects the following departments only:  [place] Postpartum area Mother Baby Unit Newborn nursery/Special care nursery  Other areas: Masks continue to be strongly recommended for [place] teammates, visitors and patients in all other areas. Visitation is not restricted outside of the units listed above.     Preparing for Surgery with CHLORHEXIDINE GLUCONATE (CHG) Soap  Chlorhexidine Gluconate (CHG) Soap  o An antiseptic cleaner that kills germs and bonds with the skin to continue killing germs even after washing  o Used for showering the night before surgery and morning of surgery  Before surgery, you can play an important role by reducing the number of germs on your skin.  CHG (Chlorhexidine gluconate) soap is an antiseptic cleanser which kills germs and bonds with the skin to continue killing germs even after washing.  Please do not use if you have an allergy to CHG or antibacterial soaps. If your skin becomes reddened/irritated stop using the CHG.  1. Shower the NIGHT BEFORE SURGERY and the MORNING OF SURGERY with  CHG soap.  2. If you choose to wash your hair, wash your hair first as usual with your normal shampoo.  3. After shampooing, rinse your hair and body thoroughly to remove the shampoo.  4. Use CHG as you would any other liquid soap. You can apply CHG directly to the skin and wash gently with a scrungie or a clean washcloth.  5. Apply the CHG soap to your body only from the neck down. Do not use on open wounds or open sores. Avoid contact with your eyes, ears, mouth, and genitals (private parts). Wash face and genitals (private parts) with your normal soap.  6. Wash thoroughly, paying special attention to the area where your surgery will be performed.  7. Thoroughly rinse your body with warm water.  8. Do not shower/wash with your normal soap after using and rinsing off the CHG soap.  9. Pat yourself dry with a clean towel.  10. Wear clean pajamas to bed the night before surgery.  12. Place clean sheets on your bed the night of your first shower and do not sleep with pets.  13. Shower again with  the CHG soap on the day of surgery prior to arriving at the hospital.  14. Do not apply any deodorants/lotions/powders.  15. Please wear clean clothes to the hospital.

## 2022-09-02 ENCOUNTER — Ambulatory Visit
Admission: RE | Admit: 2022-09-02 | Discharge: 2022-09-02 | Disposition: A | Discharge status: Home / Self Care | Payer: BC Managed Care – PPO | Source: Ambulatory Visit | Attending: Surgery | Admitting: Surgery

## 2022-09-02 ENCOUNTER — Other Ambulatory Visit: Payer: Self-pay

## 2022-09-02 ENCOUNTER — Encounter: Payer: Self-pay | Admitting: Surgery

## 2022-09-02 ENCOUNTER — Ambulatory Visit: Payer: BC Managed Care – PPO | Admitting: Anesthesiology

## 2022-09-02 ENCOUNTER — Encounter
Admission: RE | Disposition: A | Discharge status: Home / Self Care | Payer: Self-pay | Source: Ambulatory Visit | Attending: Surgery

## 2022-09-02 DIAGNOSIS — K430 Incisional hernia with obstruction, without gangrene: Secondary | ICD-10-CM | POA: Insufficient documentation

## 2022-09-02 DIAGNOSIS — F32A Depression, unspecified: Secondary | ICD-10-CM | POA: Insufficient documentation

## 2022-09-02 DIAGNOSIS — I10 Essential (primary) hypertension: Secondary | ICD-10-CM | POA: Insufficient documentation

## 2022-09-02 DIAGNOSIS — K219 Gastro-esophageal reflux disease without esophagitis: Secondary | ICD-10-CM | POA: Diagnosis not present

## 2022-09-02 DIAGNOSIS — L720 Epidermal cyst: Secondary | ICD-10-CM | POA: Insufficient documentation

## 2022-09-02 DIAGNOSIS — F419 Anxiety disorder, unspecified: Secondary | ICD-10-CM | POA: Diagnosis not present

## 2022-09-02 DIAGNOSIS — Z79899 Other long term (current) drug therapy: Secondary | ICD-10-CM | POA: Diagnosis not present

## 2022-09-02 DIAGNOSIS — B9689 Other specified bacterial agents as the cause of diseases classified elsewhere: Secondary | ICD-10-CM

## 2022-09-02 DIAGNOSIS — J45909 Unspecified asthma, uncomplicated: Secondary | ICD-10-CM | POA: Insufficient documentation

## 2022-09-02 DIAGNOSIS — K429 Umbilical hernia without obstruction or gangrene: Secondary | ICD-10-CM | POA: Diagnosis not present

## 2022-09-02 HISTORY — PX: INSERTION OF MESH: SHX5868

## 2022-09-02 HISTORY — PX: XI ROBOTIC ASSISTED VENTRAL HERNIA: SHX6789

## 2022-09-02 HISTORY — PX: UMBILICAL HERNIA REPAIR: SHX196

## 2022-09-02 HISTORY — PX: CYST EXCISION: SHX5701

## 2022-09-02 SURGERY — REPAIR, HERNIA, VENTRAL, ROBOT-ASSISTED
Anesthesia: General | Site: Back

## 2022-09-02 MED ORDER — LIDOCAINE HCL (CARDIAC) PF 100 MG/5ML IV SOSY
PREFILLED_SYRINGE | INTRAVENOUS | Status: DC | PRN
  Administered 2022-09-02: 100 mg via INTRAVENOUS

## 2022-09-02 MED ORDER — FLUTICASONE PROPIONATE 50 MCG/ACT NA SUSP: 2.0000 | NASAL | Status: AC | PRN

## 2022-09-02 MED ORDER — ORAL CARE MOUTH RINSE: 15.0000 mL | Freq: Once | OROMUCOSAL | Status: AC

## 2022-09-02 MED ORDER — HYDROMORPHONE HCL 1 MG/ML IJ SOLN
0.2500 mg | INTRAMUSCULAR | Status: DC | PRN
  Administered 2022-09-02: 0.25 mg via INTRAVENOUS

## 2022-09-02 MED ORDER — SODIUM CHLORIDE (PF) 0.9 % IJ SOLN
INTRAMUSCULAR | Status: DC | PRN
  Administered 2022-09-02: 80 mL via INTRAMUSCULAR

## 2022-09-02 MED ORDER — CHLORHEXIDINE GLUCONATE 0.12 % MT SOLN: 15.0000 mL | Freq: Once | OROMUCOSAL | Status: AC

## 2022-09-02 MED ORDER — MIDAZOLAM HCL 2 MG/2ML IJ SOLN
INTRAMUSCULAR | Status: DC | PRN
  Administered 2022-09-02: 2 mg via INTRAVENOUS

## 2022-09-02 MED ORDER — VASOPRESSIN 20 UNIT/ML IV SOLN
INTRAVENOUS | Status: DC | PRN
  Administered 2022-09-02: 1 [IU] via INTRAVENOUS
  Administered 2022-09-02 (×2): 2 [IU] via INTRAVENOUS
  Administered 2022-09-02: 1 [IU] via INTRAVENOUS

## 2022-09-02 MED ORDER — MIDAZOLAM HCL 2 MG/2ML IJ SOLN
INTRAMUSCULAR | Status: AC
  Filled 2022-09-02: qty 2

## 2022-09-02 MED ORDER — OXYCODONE HCL 5 MG PO TABS: 5.0000 mg | ORAL_TABLET | Freq: Once | ORAL | Status: AC | PRN

## 2022-09-02 MED ORDER — OXYCODONE HCL 5 MG/5ML PO SOLN: 5.0000 mg | Freq: Once | ORAL | Status: AC | PRN

## 2022-09-02 MED ORDER — LIDOCAINE HCL (PF) 1 % IJ SOLN
INTRAMUSCULAR | Status: AC
  Filled 2022-09-02: qty 30

## 2022-09-02 MED ORDER — DEXAMETHASONE SODIUM PHOSPHATE 10 MG/ML IJ SOLN
INTRAMUSCULAR | Status: DC | PRN
  Administered 2022-09-02: 10 mg via INTRAVENOUS

## 2022-09-02 MED ORDER — CHLORHEXIDINE GLUCONATE 0.12 % MT SOLN
OROMUCOSAL | Status: AC
  Administered 2022-09-02: 15 mL via OROMUCOSAL
  Filled 2022-09-02: qty 15

## 2022-09-02 MED ORDER — PHENYLEPHRINE HCL-NACL 20-0.9 MG/250ML-% IV SOLN
INTRAVENOUS | Status: AC
  Filled 2022-09-02: qty 250

## 2022-09-02 MED ORDER — OXYCODONE HCL 5 MG PO TABS
ORAL_TABLET | ORAL | Status: AC
  Administered 2022-09-02: 5 mg via ORAL
  Filled 2022-09-02: qty 1

## 2022-09-02 MED ORDER — BUPIVACAINE-EPINEPHRINE (PF) 0.5% -1:200000 IJ SOLN
INTRAMUSCULAR | Status: AC
  Filled 2022-09-02: qty 30

## 2022-09-02 MED ORDER — ACETAMINOPHEN 500 MG PO TABS: 1000.0000 mg | ORAL_TABLET | ORAL | Status: AC

## 2022-09-02 MED ORDER — CEFAZOLIN SODIUM-DEXTROSE 2-4 GM/100ML-% IV SOLN
2.0000 g | INTRAVENOUS | Status: AC
  Administered 2022-09-02: 2 g via INTRAVENOUS

## 2022-09-02 MED ORDER — FENTANYL CITRATE (PF) 100 MCG/2ML IJ SOLN
INTRAMUSCULAR | Status: AC
  Filled 2022-09-02: qty 2

## 2022-09-02 MED ORDER — ACETAMINOPHEN 500 MG PO TABS
ORAL_TABLET | ORAL | Status: AC
  Administered 2022-09-02: 1000 mg via ORAL
  Filled 2022-09-02: qty 2

## 2022-09-02 MED ORDER — LACTATED RINGERS IV SOLN: INTRAVENOUS | Status: DC

## 2022-09-02 MED ORDER — ONDANSETRON HCL 4 MG/2ML IJ SOLN
INTRAMUSCULAR | Status: DC | PRN
  Administered 2022-09-02 (×2): 4 mg via INTRAVENOUS

## 2022-09-02 MED ORDER — BUPIVACAINE LIPOSOME 1.3 % IJ SUSP
INTRAMUSCULAR | Status: AC
  Filled 2022-09-02: qty 20

## 2022-09-02 MED ORDER — PROPOFOL 10 MG/ML IV BOLUS
INTRAVENOUS | Status: DC | PRN
  Administered 2022-09-02: 200 mg via INTRAVENOUS

## 2022-09-02 MED ORDER — DEXMEDETOMIDINE HCL IN NACL 200 MCG/50ML IV SOLN
INTRAVENOUS | Status: DC | PRN
  Administered 2022-09-02: 12 ug via INTRAVENOUS
  Administered 2022-09-02: 8 ug via INTRAVENOUS
  Administered 2022-09-02: 12 ug via INTRAVENOUS

## 2022-09-02 MED ORDER — EXEMESTANE 25 MG PO TABS: 25.0000 mg | ORAL_TABLET | Freq: Every day | ORAL | Status: DC

## 2022-09-02 MED ORDER — FAMOTIDINE 20 MG PO TABS
ORAL_TABLET | ORAL | Status: AC
  Administered 2022-09-02: 20 mg via ORAL
  Filled 2022-09-02: qty 1

## 2022-09-02 MED ORDER — CHLORHEXIDINE GLUCONATE CLOTH 2 % EX PADS
6.0000 | MEDICATED_PAD | Freq: Once | CUTANEOUS | Status: AC
  Administered 2022-09-02: 6 via TOPICAL

## 2022-09-02 MED ORDER — HYDROMORPHONE HCL 1 MG/ML IJ SOLN
INTRAMUSCULAR | Status: AC
  Administered 2022-09-02: 0.25 mg via INTRAVENOUS
  Filled 2022-09-02: qty 1

## 2022-09-02 MED ORDER — ACETAMINOPHEN 325 MG PO TABS: 650.0000 mg | ORAL_TABLET | Freq: Three times a day (TID) | ORAL | 0 refills | Status: AC | PRN

## 2022-09-02 MED ORDER — HYDROMORPHONE HCL 1 MG/ML IJ SOLN
INTRAMUSCULAR | Status: DC | PRN
  Administered 2022-09-02: 1 mg via INTRAVENOUS

## 2022-09-02 MED ORDER — SUCCINYLCHOLINE CHLORIDE 200 MG/10ML IV SOSY
PREFILLED_SYRINGE | INTRAVENOUS | Status: DC | PRN
  Administered 2022-09-02: 120 mg via INTRAVENOUS

## 2022-09-02 MED ORDER — EPHEDRINE SULFATE (PRESSORS) 50 MG/ML IJ SOLN
INTRAMUSCULAR | Status: DC | PRN
  Administered 2022-09-02 (×3): 5 mg via INTRAVENOUS

## 2022-09-02 MED ORDER — PHENYLEPHRINE HCL-NACL 20-0.9 MG/250ML-% IV SOLN
INTRAVENOUS | Status: DC | PRN
  Administered 2022-09-02: 30 ug/min via INTRAVENOUS

## 2022-09-02 MED ORDER — HYDROMORPHONE HCL 1 MG/ML IJ SOLN
INTRAMUSCULAR | Status: AC
  Filled 2022-09-02: qty 1

## 2022-09-02 MED ORDER — GLYCOPYRROLATE 0.2 MG/ML IJ SOLN
INTRAMUSCULAR | Status: DC | PRN
  Administered 2022-09-02: .2 mg via INTRAVENOUS

## 2022-09-02 MED ORDER — BUPIVACAINE-EPINEPHRINE (PF) 0.25% -1:200000 IJ SOLN
INTRAMUSCULAR | Status: AC
  Filled 2022-09-02: qty 30

## 2022-09-02 MED ORDER — FAMOTIDINE 20 MG PO TABS: 20.0000 mg | ORAL_TABLET | Freq: Once | ORAL | Status: AC

## 2022-09-02 MED ORDER — PHENYLEPHRINE HCL (PRESSORS) 10 MG/ML IV SOLN
INTRAVENOUS | Status: AC
  Filled 2022-09-02: qty 1

## 2022-09-02 MED ORDER — OXYCODONE-ACETAMINOPHEN 5-325 MG PO TABS: 1.0000 | ORAL_TABLET | Freq: Three times a day (TID) | ORAL | 0 refills | Status: DC | PRN

## 2022-09-02 MED ORDER — ROCURONIUM BROMIDE 100 MG/10ML IV SOLN
INTRAVENOUS | Status: DC | PRN
  Administered 2022-09-02: 50 mg via INTRAVENOUS
  Administered 2022-09-02: 20 mg via INTRAVENOUS
  Administered 2022-09-02: 50 mg via INTRAVENOUS
  Administered 2022-09-02: 80 mg via INTRAVENOUS

## 2022-09-02 MED ORDER — BUPIVACAINE HCL 0.25 % IJ SOLN
INTRAMUSCULAR | Status: DC | PRN
  Administered 2022-09-02: 10 mL

## 2022-09-02 MED ORDER — FENTANYL CITRATE (PF) 100 MCG/2ML IJ SOLN
INTRAMUSCULAR | Status: DC | PRN
  Administered 2022-09-02: 100 ug via INTRAVENOUS

## 2022-09-02 MED ORDER — ROCURONIUM BROMIDE 10 MG/ML (PF) SYRINGE
PREFILLED_SYRINGE | INTRAVENOUS | Status: AC
  Filled 2022-09-02: qty 10

## 2022-09-02 MED ORDER — 0.9 % SODIUM CHLORIDE (POUR BTL) OPTIME
TOPICAL | Status: DC | PRN
  Administered 2022-09-02: 500 mL

## 2022-09-02 MED ORDER — CEFAZOLIN SODIUM-DEXTROSE 2-4 GM/100ML-% IV SOLN
INTRAVENOUS | Status: AC
  Filled 2022-09-02: qty 100

## 2022-09-02 MED ORDER — SUGAMMADEX SODIUM 500 MG/5ML IV SOLN
INTRAVENOUS | Status: DC | PRN
  Administered 2022-09-02: 400 mg via INTRAVENOUS

## 2022-09-02 MED ORDER — SODIUM CHLORIDE (PF) 0.9 % IJ SOLN
INTRAMUSCULAR | Status: AC
  Filled 2022-09-02: qty 50

## 2022-09-02 SURGICAL SUPPLY — 80 items
ADH SKN CLS APL DERMABOND .7 (GAUZE/BANDAGES/DRESSINGS) ×4
APL PRP STRL LF DISP 70% ISPRP (MISCELLANEOUS)
BLADE SURG 15 STRL LF DISP TIS (BLADE) ×4 IMPLANT
BLADE SURG 15 STRL SS (BLADE) ×4
BLADE SURG SZ11 CARB STEEL (BLADE) ×4 IMPLANT
CANNULA REDUC XI 12-8 STAPL (CANNULA) ×4
CANNULA REDUCER 12-8 DVNC XI (CANNULA) ×4 IMPLANT
CHLORAPREP W/TINT 26 (MISCELLANEOUS) ×4 IMPLANT
COVER TIP SHEARS 8 DVNC (MISCELLANEOUS) ×4 IMPLANT
COVER TIP SHEARS 8MM DA VINCI (MISCELLANEOUS) ×4
DERMABOND ADVANCED .7 DNX12 (GAUZE/BANDAGES/DRESSINGS) ×4 IMPLANT
DRAPE 3/4 80X56 (DRAPES) ×4 IMPLANT
DRAPE ARM DVNC X/XI (DISPOSABLE) ×12 IMPLANT
DRAPE COLUMN DVNC XI (DISPOSABLE) ×4 IMPLANT
DRAPE DA VINCI XI ARM (DISPOSABLE) ×12
DRAPE DA VINCI XI COLUMN (DISPOSABLE) ×4
DRAPE LAPAROTOMY 100X77 ABD (DRAPES) ×4 IMPLANT
DRAPE LAPAROTOMY 77X122 PED (DRAPES) ×4 IMPLANT
ELECT CAUTERY BLADE 6.4 (BLADE) ×4 IMPLANT
ELECT REM PT RETURN 9FT ADLT (ELECTROSURGICAL) ×4
ELECTRODE REM PT RTRN 9FT ADLT (ELECTROSURGICAL) ×4 IMPLANT
GAUZE 4X4 16PLY ~~LOC~~+RFID DBL (SPONGE) ×4 IMPLANT
GLOVE BIOGEL PI IND STRL 7.0 (GLOVE) ×8 IMPLANT
GLOVE SURG SYN 6.5 ES PF (GLOVE) ×8 IMPLANT
GLOVE SURG SYN 6.5 PF PI (GLOVE) ×8 IMPLANT
GOWN STRL REUS W/ TWL LRG LVL3 (GOWN DISPOSABLE) ×12 IMPLANT
GOWN STRL REUS W/TWL LRG LVL3 (GOWN DISPOSABLE) ×12
GRASPER SUT TROCAR 14GX15 (MISCELLANEOUS) IMPLANT
IRRIGATOR SUCT 8 DISP DVNC XI (IRRIGATION / IRRIGATOR) IMPLANT
IRRIGATOR SUCTION 8MM XI DISP (IRRIGATION / IRRIGATOR)
IV NS 1000ML (IV SOLUTION)
IV NS 1000ML BAXH (IV SOLUTION) IMPLANT
KIT TURNOVER KIT A (KITS) ×4 IMPLANT
LABEL OR SOLS (LABEL) ×4 IMPLANT
MANIFOLD NEPTUNE II (INSTRUMENTS) ×4 IMPLANT
MESH VENTRALIGHT ST 4.5 ECHO (Mesh General) IMPLANT
NDL INSUFFLATION 14GA 120MM (NEEDLE) ×4 IMPLANT
NEEDLE HYPO 22GX1.5 SAFETY (NEEDLE) ×4 IMPLANT
NEEDLE INSUFFLATION 14GA 120MM (NEEDLE) ×4 IMPLANT
NS IRRIG 1000ML POUR BTL (IV SOLUTION) ×4 IMPLANT
NS IRRIG 500ML POUR BTL (IV SOLUTION) ×4 IMPLANT
OBTURATOR OPTICAL STANDARD 8MM (TROCAR) ×4
OBTURATOR OPTICAL STND 8 DVNC (TROCAR) ×4
OBTURATOR OPTICALSTD 8 DVNC (TROCAR) ×4 IMPLANT
PACK BASIN MINOR ARMC (MISCELLANEOUS) ×4 IMPLANT
PACK LAP CHOLECYSTECTOMY (MISCELLANEOUS) ×4 IMPLANT
PENCIL SMOKE EVACUATOR (MISCELLANEOUS) ×4 IMPLANT
SEAL CANN UNIV 5-8 DVNC XI (MISCELLANEOUS) ×8 IMPLANT
SEAL XI 5MM-8MM UNIVERSAL (MISCELLANEOUS) ×8
SET TUBE SMOKE EVAC HIGH FLOW (TUBING) ×4 IMPLANT
SOLUTION ELECTROLUBE (MISCELLANEOUS) ×4 IMPLANT
STAPLER CANNULA SEAL DVNC XI (STAPLE) ×4 IMPLANT
STAPLER CANNULA SEAL XI (STAPLE) ×4
SUT ETHIBOND NAB MO 7 #0 18IN (SUTURE) ×4 IMPLANT
SUT ETHILON 3-0 FS-10 30 BLK (SUTURE)
SUT MNCRL 4-0 (SUTURE) ×4
SUT MNCRL 4-0 27XMFL (SUTURE) ×4
SUT MNCRL AB 4-0 PS2 18 (SUTURE) ×4 IMPLANT
SUT STRATAFIX 0 PDS+ CT-2 23 (SUTURE) ×8
SUT V-LOC 90 ABS 3-0 VLT  V-20 (SUTURE) ×8
SUT V-LOC 90 ABS 3-0 VLT V-20 (SUTURE) IMPLANT
SUT V-LOC 90 ABS DVC 3-0 CL (SUTURE) ×8 IMPLANT
SUT VIC AB 2-0 SH 27 (SUTURE) ×4
SUT VIC AB 2-0 SH 27XBRD (SUTURE) ×4 IMPLANT
SUT VIC AB 3-0 SH 27 (SUTURE) ×4
SUT VIC AB 3-0 SH 27X BRD (SUTURE) ×4 IMPLANT
SUT VICRYL 0 UR6 27IN ABS (SUTURE) ×4 IMPLANT
SUT VLOC 90 3-0 CLR P-12 (SUTURE) IMPLANT
SUTURE EHLN 3-0 FS-10 30 BLK (SUTURE) IMPLANT
SUTURE MNCRL 4-0 27XMF (SUTURE) ×4 IMPLANT
SUTURE STRATFX 0 PDS+ CT-2 23 (SUTURE) ×4 IMPLANT
SYR 10ML LL (SYRINGE) ×8 IMPLANT
SYR 30ML LL (SYRINGE) ×4 IMPLANT
SYSTEM WECK SHIELD CLOSURE (TROCAR) IMPLANT
TOWEL OR 17X26 4PK STRL BLUE (TOWEL DISPOSABLE) ×4 IMPLANT
TRAP FLUID SMOKE EVACUATOR (MISCELLANEOUS) ×4 IMPLANT
TRAY FOLEY MTR SLVR 16FR STAT (SET/KITS/TRAYS/PACK) ×4 IMPLANT
TROCAR XCEL NON-BLD 5MMX100MML (ENDOMECHANICALS) IMPLANT
WATER STERILE IRR 1000ML POUR (IV SOLUTION) ×4 IMPLANT
WATER STERILE IRR 500ML POUR (IV SOLUTION) ×4 IMPLANT
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
## 2022-09-02 NOTE — Anesthesia Postprocedure Evaluation (Signed)
Anesthesia Post Note  Patient: [name]  Procedure(s) Performed: XI ROBOTIC ASSISTED VENTRAL HERNIA (Abdomen) CYST REMOVAL x 2 (Back) HERNIA REPAIR UMBILICAL INSERTION OF MESH (Abdomen)  Patient location during evaluation: PACU Anesthesia Type: General Level of consciousness: awake and alert Pain management: pain level controlled Vital Signs Assessment: post-procedure vital signs reviewed and stable Respiratory status: spontaneous breathing, nonlabored ventilation, respiratory function stable and patient connected to nasal cannula oxygen Cardiovascular status: blood pressure returned to baseline and stable Postop Assessment: no apparent nausea or vomiting Anesthetic complications: no   No notable events documented.   Last Vitals:  Vitals:   09/02/22 1415 09/02/22 1430  BP: (!) 102/59 (!) 100/59  Pulse: 73 75  Resp: 13 14  Temp: 36.9 C   SpO2: 98% 93%    Last Pain:  Vitals:   09/02/22 1415  TempSrc:   PainSc: Asleep                 Ilene Qua

## 2022-09-02 NOTE — Interval H&P Note (Signed)
History and Physical Interval Note:  09/02/2022 9:52 AM  [name]  has presented today for surgery, with the diagnosis of K43.0 incisional hernia- will proceed with repair along with umbilical hernia and removal of two epidermal cysts on upper back.  The various methods of treatment have been discussed with the patient and family. After consideration of risks, benefits and other options for treatment, the patient has consented to  Procedure(s): XI ROBOTIC ASSISTED VENTRAL HERNIA (N/A) CYST REMOVAL x 2 (N/A) HERNIA REPAIR UMBILICAL ADULT (N/A) as a surgical intervention.  The patient's history has been reviewed, patient examined, no change in status, stable for surgery.  I have reviewed the patient's chart and labs.  Questions were answered to the patient's satisfaction.     Honi Name [name]

## 2022-09-02 NOTE — Anesthesia Procedure Notes (Signed)
Procedure Name: Intubation Date/Time: 09/02/2022 10:36 AM  Performed by: Ilene Qua, MDPre-anesthesia Checklist: Patient identified, Emergency Drugs available, Suction available and Patient being monitored Patient Re-evaluated:Patient Re-evaluated prior to induction Oxygen Delivery Method: Circle system utilized Preoxygenation: Pre-oxygenation with 100% oxygen Induction Type: IV induction Ventilation: Mask ventilation without difficulty Laryngoscope Size: McGraph and 3 Grade View: Grade I Tube type: Oral Tube size: 7.0 mm Number of attempts: 1 Airway Equipment and Method: Stylet and Oral airway Placement Confirmation: ETT inserted through vocal cords under direct vision, positive ETCO2, breath sounds checked- equal and bilateral and CO2 detector Secured at: 21 cm Tube secured with: Tape Dental Injury: Teeth and Oropharynx as per pre-operative assessment

## 2022-09-02 NOTE — Op Note (Signed)
Preoperative diagnosis: Back epidermal cyst x2, initial reducible incisional hernia and initial reducible umbilical hernia Postoperative diagnosis: same  Procedure: Back epidermal cyst x2 excision, robotic assisted laparoscopic primary umbilical hernia repair, incisional hernia repair with mesh Anesthesia: general  Surgeon: Benjamine Sprague  Wound Classification: Clean  Specimen: none  Complications: None  Estimated Blood Loss: 43m  Indications:see HPI  Findings: Back epidermal cyst x2, umbilical and incisional hernia 2. Tension free repair achieved with 1 strata fix for the umbilical hernia, primary closure with 1 strata fix for the incisional hernia which was then reinforced with 11 cm Bard Ventralight mesh 3. Adequate hemostasis  Description of procedure: The patient was brought to the operating room and general anesthesia was induced. A time-out was completed verifying correct patient, procedure, site, positioning, and implant(s) and/or special equipment prior to beginning this procedure. Antibiotics were administered prior to making the incision. SCDs placed.   Patient was placed in left lateral position on the stretcher and the epidermal cyst area was prepped and draped.  Local infused around each site and a 2 cm incision was made over the right epidermal cyst first.  Dissection carried around the cyst using combination of electrocautery and sharp dissection until the 1 cm x 8 mm x 1.2 cm epidermal cyst was removed completely.  Wound irrigated, hemostasis noted then overlying skin closed with 4-0 Monocryl x1.  Attention turned to the left epidermal cyst and the procedure was repeated.  a 2 cm incision was made over the epidermal cyst.  Dissection carried around the 1.3 cm x 1.3 cm x 1 cyst using combination of electrocautery and sharp dissection until the epidermal cyst was removed completely.  Wound irrigated, hemostasis noted then overlying skin closed with 4-0 Monocryl x1.     Patient was then transferred to the operating table in supine position.  The anterior abdominal wall was prepped and draped in the standard sterile fashion.  Palmer's point chosen for entry.  Veress needle placed and abdomen insufflated to 15cm without any dramatic increase in pressure.  3 additional ports, 848mx2 and 1256malong right lateral aspect placed.  Xi robot then docked into place.  Exparel was infused in a TAP block.   Hernia contents noted and reduced with combination of blunt, sharp dissection with scissors and fenestrated forceps.  Hemostasis achieved throughout this portion.  Once all hernia contents reduced, there was noted to be a 2 cm x 2 cm incisional hernia and a 1 cm x 1 cm umbilical hernia.  Incisional hernia addressed first by attempting to create a preperitoneal flap in preparation for a placement of a preperitoneal mesh.  However dissection of the peritoneal contents was unsuccessful at the hernia site due to the very attenuated layer.  Decision made at this point to convert to a intraperitoneal onlay mesh coverage.  Insufflation dropped to 46m64md transfacial suture with 0 stratafix x2 used to primarily close both incisional and umbilical hernia defect under minimal tension.  The partially created peritoneal flap over the incisional hernia was then closed back up to the abdominal wall using 3 oh V-Loc.  The strata fix used to close the umbilical hernia was partially covered by this flap as well. Bard echo plus protected 11 cm mesh was placed within the abdominal cavity through 12mm40mt and secured to the abdominal wall centered over the incisional hernia defect in the left upper quadrant.  The mesh did not cover the umbilical hernia due to its small size and no need for  additional coverage.    The mesh was then circumferentially sutured into the anterior abdominal wall using 2-0 VLock x2.  Any bleeding noted during this portion was no longer actively bleeding by end of securing  mesh and tightening the suture.    Robot was undocked.  The 25m cannula was removed and port site was closed using PMI device and 0 vicryl suture, ensuring no bowels were injured during this process.  Abdomen then desufflated while camera within abdomen to ensure no signs of new bleed prior to removing camera and rest of ports completely.  12 mm port site skin closed using 3-0 Vicryl for the deep dermal layer in an interrupted fashion and  All skin incisions closed with runninrg 4-0 Monocryl in a subcuticular fashion.  All wounds then dressed with Dermabond.  Patient was then successfully awakened and transferred to PACU in stable condition.  At the end of the procedure sponge and instrument counts were correct.

## 2022-09-02 NOTE — Transfer of Care (Signed)
Immediate Anesthesia Transfer of Care Note  Patient: [name]  Procedure(s) Performed: XI ROBOTIC ASSISTED VENTRAL HERNIA (Abdomen) CYST REMOVAL x 2 (Back) HERNIA REPAIR UMBILICAL INSERTION OF MESH (Abdomen)  Patient Location: PACU  Anesthesia Type:General  Level of Consciousness: drowsy and patient cooperative  Airway & Oxygen Therapy: Patient Spontanous Breathing and Patient connected to face mask oxygen  Post-op Assessment: Report given to RN and Post -op Vital signs reviewed and stable  Post vital signs: Reviewed and stable  Last Vitals:  Vitals Value Taken Time  BP 102/59 09/02/22 1415  Temp 36.9 C 09/02/22 1415  Pulse 76 09/02/22 1419  Resp 15 09/02/22 1419  SpO2 93 % 09/02/22 1419  Vitals shown include unvalidated device data.  Last Pain:  Vitals:   09/02/22 0923  TempSrc: Temporal  PainSc: 7          Complications: No notable events documented.

## 2022-09-02 NOTE — Discharge Instructions (Addendum)
Hernia repair, Care After This sheet gives you information about how to care for yourself after your procedure. Your health care provider may also give you more specific instructions. If you have problems or questions, contact your health care provider. What can I expect after the procedure? After your procedure, it is common to have the following: Pain in your abdomen, especially in the incision areas. You will be given medicine to control the pain. Tiredness. This is a normal part of the recovery process. Your energy level will return to normal over the next several weeks. Changes in your bowel movements, such as constipation or needing to go more often. Talk with your health care provider about how to manage this. Follow these instructions at home: Medicines  tylenol and advil as needed for discomfort.  Please alternate between the two every four hours as needed for pain.    Use narcotics, if prescribed, only when tylenol and motrin is not enough to control pain.  325-650mg every 8hrs to max of 3000mg/24hrs (including the 325mg in every norco dose) for the tylenol.    Advil up to 800mg per dose every 8hrs as needed for pain.   PLEASE RECORD NUMBER OF PILLS TAKEN UNTIL NEXT FOLLOW UP APPT.  THIS WILL HELP DETERMINE HOW READY YOU ARE TO BE RELEASED FROM ANY ACTIVITY RESTRICTIONS Do not drive or use heavy machinery while taking prescription pain medicine. Do not drink alcohol while taking prescription pain medicine.  Incision care    Follow instructions from your health care provider about how to take care of your incision areas. Make sure you: Keep your incisions clean and dry. Wash your hands with soap and water before and after applying medicine to the areas, and before and after changing your bandage (dressing). If soap and water are not available, use hand sanitizer. Change your dressing as told by your health care provider. Leave stitches (sutures), skin glue, or adhesive strips in  place. These skin closures may need to stay in place for 2 weeks or longer. If adhesive strip edges start to loosen and curl up, you may trim the loose edges. Do not remove adhesive strips completely unless your health care provider tells you to do that. Do not wear tight clothing over the incisions. Tight clothing may rub and irritate the incision areas, which may cause the incisions to open. Do not take baths, swim, or use a hot tub until your health care provider approves. OK TO SHOWER IN 24HRS.   Check your incision area every day for signs of infection. Check for: More redness, swelling, or pain. More fluid or blood. Warmth. Pus or a bad smell. Activity Avoid lifting anything that is heavier than 10 lb (4.5 kg) for 2 weeks or until your health care provider says it is okay. No pushing/pulling greater than 30lbs You may resume normal activities as told by your health care provider. Ask your health care provider what activities are safe for you. Take rest breaks during the day as needed. Eating and drinking Follow instructions from your health care provider about what you can eat after surgery. To prevent or treat constipation while you are taking prescription pain medicine, your health care provider may recommend that you: Drink enough fluid to keep your urine clear or pale yellow. Take over-the-counter or prescription medicines. Eat foods that are high in fiber, such as fresh fruits and vegetables, whole grains, and beans. Limit foods that are high in fat and processed sugars, such as fried and   sweet foods. General instructions Ask your health care provider when you will need an appointment to get your sutures or staples removed. Keep all follow-up visits as told by your health care provider. This is important. Contact a health care provider if: You have more redness, swelling, or pain around your incisions. You have more fluid or blood coming from the incisions. Your incisions feel  warm to the touch. You have pus or a bad smell coming from your incisions or your dressing. You have a fever. You have an incision that breaks open (edges not staying together) after sutures or staples have been removed. You develop a rash. You have chest pain or difficulty breathing. You have pain or swelling in your legs. You feel light-headed or you faint. Your abdomen swells (becomes distended). You have nausea or vomiting. You have blood in your stool (feces). This information is not intended to replace advice given to you by your health care provider. Make sure you discuss any questions you have with your health care provider. Document Released: 04/22/2005 Document Revised: 06/22/2018 Document Reviewed: 07/04/2016 Elsevier Interactive Patient Education  2019 [place]   The drugs that you were given will stay in your system until tomorrow so for the next 24 hours you should not:  Drive an automobile Make any legal decisions Drink any alcoholic beverage   You may resume regular meals tomorrow.  Today it is better to start with liquids and gradually work up to solid foods.  You may eat anything you prefer, but it is better to start with liquids, then soup and crackers, and gradually work up to solid foods.   Please notify your doctor immediately if you have any unusual bleeding, trouble breathing, redness and pain at the surgery site, drainage, fever, or pain not relieved by medication.    Additional Instructions:   PLEASE DO NOT REMOVE GREEN ARMBAND FOR 4 DAYS    Please contact your physician with any problems or Same Day Surgery at [phone], Monday through Friday 6 am to 4 pm, or [place] at [facility] number at [phone].

## 2022-09-05 ENCOUNTER — Inpatient Hospital Stay: Payer: BC Managed Care – PPO | Attending: Oncology

## 2022-09-05 ENCOUNTER — Encounter: Payer: Self-pay | Admitting: Surgery

## 2022-09-05 ENCOUNTER — Inpatient Hospital Stay (HOSPITAL_BASED_OUTPATIENT_CLINIC_OR_DEPARTMENT_OTHER): Payer: BC Managed Care – PPO | Admitting: Oncology

## 2022-09-05 ENCOUNTER — Inpatient Hospital Stay: Payer: BC Managed Care – PPO

## 2022-09-05 VITALS — BP 120/93 | HR 116 | Temp 98.3°F | Resp 18 | Wt 223.0 lb

## 2022-09-05 DIAGNOSIS — Z79811 Long term (current) use of aromatase inhibitors: Secondary | ICD-10-CM | POA: Diagnosis not present

## 2022-09-05 DIAGNOSIS — Z170 Estrogen receptor positive status [ER+]: Secondary | ICD-10-CM

## 2022-09-05 DIAGNOSIS — E2839 Other primary ovarian failure: Secondary | ICD-10-CM

## 2022-09-05 DIAGNOSIS — Z5111 Encounter for antineoplastic chemotherapy: Secondary | ICD-10-CM | POA: Insufficient documentation

## 2022-09-05 DIAGNOSIS — C50411 Malignant neoplasm of upper-outer quadrant of right female breast: Secondary | ICD-10-CM | POA: Diagnosis not present

## 2022-09-05 DIAGNOSIS — Z7983 Long term (current) use of bisphosphonates: Secondary | ICD-10-CM

## 2022-09-05 DIAGNOSIS — Z5181 Encounter for therapeutic drug level monitoring: Secondary | ICD-10-CM | POA: Diagnosis not present

## 2022-09-05 DIAGNOSIS — C50811 Malignant neoplasm of overlapping sites of right female breast: Secondary | ICD-10-CM | POA: Insufficient documentation

## 2022-09-05 DIAGNOSIS — Z08 Encounter for follow-up examination after completed treatment for malignant neoplasm: Secondary | ICD-10-CM

## 2022-09-05 LAB — CBC WITH DIFFERENTIAL/PLATELET
Abs Immature Granulocytes: 0.02 10*3/uL (ref 0.00–0.07)
Basophils Absolute: 0 10*3/uL (ref 0.0–0.1)
Basophils Relative: 0 %
Eosinophils Absolute: 0.2 10*3/uL (ref 0.0–0.5)
Eosinophils Relative: 2 %
HCT: 43.2 % (ref 36.0–46.0)
Hemoglobin: 15 g/dL (ref 12.0–15.0)
Immature Granulocytes: 0 %
Lymphocytes Relative: 22 %
Lymphs Abs: 2.2 10*3/uL (ref 0.7–4.0)
MCH: 30.1 pg (ref 26.0–34.0)
MCHC: 34.7 g/dL (ref 30.0–36.0)
MCV: 86.6 fL (ref 80.0–100.0)
Monocytes Absolute: 0.7 10*3/uL (ref 0.1–1.0)
Monocytes Relative: 7 %
Neutro Abs: 6.9 10*3/uL (ref 1.7–7.7)
Neutrophils Relative %: 69 %
Platelets: 292 10*3/uL (ref 150–400)
RBC: 4.99 MIL/uL (ref 3.87–5.11)
RDW: 12 % (ref 11.5–15.5)
WBC: 10.1 10*3/uL (ref 4.0–10.5)
nRBC: 0 % (ref 0.0–0.2)

## 2022-09-05 LAB — COMPREHENSIVE METABOLIC PANEL
ALT: 27 U/L (ref 0–44)
AST: 37 U/L (ref 15–41)
Albumin: 4.3 g/dL (ref 3.5–5.0)
Alkaline Phosphatase: 73 U/L (ref 38–126)
Anion gap: 10 (ref 5–15)
BUN: 11 mg/dL (ref 6–20)
CO2: 25 mmol/L (ref 22–32)
Calcium: 9.8 mg/dL (ref 8.9–10.3)
Chloride: 102 mmol/L (ref 98–111)
Creatinine, Ser: 0.78 mg/dL (ref 0.44–1.00)
GFR, Estimated: >60 mL/min (ref >60)
Glucose, Bld: 115 mg/dL — ABNORMAL HIGH (ref 70–99)
Potassium: 3.8 mmol/L (ref 3.5–5.1)
Sodium: 137 mmol/L (ref 135–145)
Total Bilirubin: 0.6 mg/dL (ref 0.3–1.2)
Total Protein: 7.9 g/dL (ref 6.5–8.1)

## 2022-09-05 MED ORDER — SODIUM CHLORIDE 0.9 % IV SOLN
Freq: Once | INTRAVENOUS | Status: AC
  Filled 2022-09-05: qty 250

## 2022-09-05 MED ORDER — ZOLEDRONIC ACID 4 MG/100ML IV SOLN
4.0000 mg | Freq: Once | INTRAVENOUS | Status: AC
  Administered 2022-09-05: 4 mg via INTRAVENOUS
  Filled 2022-09-05 (×2): qty 100

## 2022-09-05 MED ORDER — GOSERELIN ACETATE 3.6 MG ~~LOC~~ IMPL
3.6000 mg | DRUG_IMPLANT | Freq: Once | SUBCUTANEOUS | Status: AC
  Administered 2022-09-05: 3.6 mg via SUBCUTANEOUS
  Filled 2022-09-05: qty 3.6
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
## 2022-09-05 NOTE — Progress Notes (Signed)
Patient here for oncology follow-up appointment, concerns of surgical pain, constipation (had recent hernia repair )

## 2022-09-06 LAB — SURGICAL PATHOLOGY

## 2022-09-14 DIAGNOSIS — K429 Umbilical hernia without obstruction or gangrene: Secondary | ICD-10-CM | POA: Diagnosis not present

## 2022-09-14 DIAGNOSIS — L720 Epidermal cyst: Secondary | ICD-10-CM | POA: Diagnosis not present

## 2022-09-14 DIAGNOSIS — K430 Incisional hernia with obstruction, without gangrene: Secondary | ICD-10-CM | POA: Diagnosis not present

## 2022-09-20 DIAGNOSIS — M47816 Spondylosis without myelopathy or radiculopathy, lumbar region: Secondary | ICD-10-CM | POA: Diagnosis not present

## 2022-09-20 DIAGNOSIS — M5459 Other low back pain: Secondary | ICD-10-CM | POA: Diagnosis not present

## 2022-09-20 DIAGNOSIS — M5416 Radiculopathy, lumbar region: Secondary | ICD-10-CM | POA: Diagnosis not present

## 2022-09-20 DIAGNOSIS — M7918 Myalgia, other site: Secondary | ICD-10-CM | POA: Diagnosis not present

## 2022-09-22 NOTE — Progress Notes (Signed)
NIPPLE AREOLAR TATTOO PROCEDURE  PREOPERATIVE DIAGNOSIS:  Acquired absence of bilateral nipple areolar   POSTOPERATIVE DIAGNOSIS: Acquired absence of bilateral nipple areolar    PROCEDURES: Bilateral nipple areolar tattoo   ANESTHESIA:  EMLA, 4% Lidocaine with epinephrine  COMPLICATIONS: None.  JUSTIFICATION FOR PROCEDURE:  [name] is a 41 y.o. female with a history of breast cancer status post breast reconstruction. The patient presents for nipple areolar complex tattoo. Risks, benefits, indications, and alternatives of the above described procedures were discussed with the patient and all the patient's questions were answered.   05/20/2022  She has a history of bilateral mastectomies with expander reconstruction and ultimate implant exchange performed 01/06/2021 by Dr. Marla Roe.  At the beginning of the encounter, we discussed her goals for nipple areolar tattooing.  She had considered possible options including non-areolar postmastectomy tattoos, but reports that she has been eagerly awaiting her opportunity for nipple areolar tattooing.     With regard to possible colors, she states that she would like to be similar to how she was prior to her bilateral mastectomies.  Reviewed images from 07/2020.  She states that her only preference is that the color is slightly less pink and more tan/darker than how she was preoperatively.  We also discussed positioning of her nipples and have decided that they would be higher and more centered on the breast than they were preoperatively.  She is more of a Fitzpatrick 1-2, but has recently dyed her hair dark brown/black and has been going to a tanning bed.  07/29/2022 Patient reports that she has had considerable fading of the areola, but the inferior shading remained prominent.  She was quite pleased with the color at the conclusion of her last visit and is not looking to make changes with color of areola.  She is hoping that she can have the nipple  color done today.  Discussed slightly darker shade than the areola to add contrast.  Additionally, discussed placement of Montgomery tubercles for added detail.  Given the prominence of inferior nipple shading from initial encounter, will not add any additional shading today.  Overall, she is pleased.  09/23/2022 When patient came in today, she did have some concerns.  She told me that the areola color has faded since last visit and appears to peachy rather than pink that she had preoperatively.  While she states that she is a natural redhead with fair skin that burns easily, recently she has been tanning and dying her hair dark.  Discussed trying to go from a peachy/pink areolar color to a slightly tender collar for more natural outcome.  She is completely in agreement and is willing to go darker.  Mixed completely new colors and she was agreeable with proceeding in the new direction.  She also states that she would like for the nipple to be darker relative to the areola.  DESCRIPTION OF PROCEDURE: After written informed consent was obtained and proper identification of patient and surgical site was made, the patient was taken to the procedure room and placed supine on the operating room table. A time out was performed to confirm patient's identity and surgical site. The patient was prepped and draped in the usual sterile fashion. Alcohol swabs were used. Attention was turned to the selection of flesh colored permanent tattoo ink to produce the appropriate color for nipple areolar complex tattooing. Using a digital revo 7R tattoo head, pointillism pigment was instilled to the designed nipple areolar complex which was confirmed preoperatively with the  patient using the Digital-Pop Deluxe machine by [employer]. Spiral technique was used to fill out the areola.  Attention was then directed towards the nipples and utilization of slightly darker pigment for contrast.  We then did a single pass with a 1P  needle around the nipple for shading.  Once adequate pigment had been applied to the nipple areolar complex, Xeroform dressing was applied followed by 4 x 4 gauze and secured with Medipore tape. There were no complications and the procedure was tolerated without difficulty.    Areola-color: Cool Honey (1), Cool Peach (1), Fair Peach (4), and Fair Honey (2) in 1:1:4:2 ratio respectively. Nipple color: Cool Honey (1), Cool Peach (1), Fair Peach (1), and Fair Honey (1) in 1:1:1:1 ratio respectively. Montgomery Tubercles: Bright Peach (2), Fair Honey (2), and Portrait White (1) in 2:2:1 ratio respectively. Did not perform at today's visit. Nipple shading: Dark Tan (1), Dark Honey (1), and Tan Honey 4 in 1:1:4 ratio. Consider just cool honey and cool peach if any additional shading required at next encounter. Colors EXP: 10/2024  Nipples are each approximately 4.25 cm diameter at conclusion of visit.  Return in 8-12 weeks for 1 hour scheduled touch-up for possible nipple darkening and montgomery tubercles.    Picture(s) obtained of the patient and placed in the chart were with the patient's or guardian's permission.

## 2022-09-23 ENCOUNTER — Ambulatory Visit (INDEPENDENT_AMBULATORY_CARE_PROVIDER_SITE_OTHER): Payer: BC Managed Care – PPO | Admitting: Physician Assistant

## 2022-09-23 DIAGNOSIS — C50411 Malignant neoplasm of upper-outer quadrant of right female breast: Secondary | ICD-10-CM

## 2022-09-23 DIAGNOSIS — Z170 Estrogen receptor positive status [ER+]: Secondary | ICD-10-CM | POA: Diagnosis not present

## 2022-09-23 DIAGNOSIS — Z9013 Acquired absence of bilateral breasts and nipples: Secondary | ICD-10-CM

## 2022-09-28 ENCOUNTER — Other Ambulatory Visit: Payer: Self-pay | Admitting: Surgery

## 2022-09-28 DIAGNOSIS — K430 Incisional hernia with obstruction, without gangrene: Secondary | ICD-10-CM

## 2022-10-06 ENCOUNTER — Inpatient Hospital Stay: Payer: BC Managed Care – PPO | Attending: Oncology

## 2022-10-06 DIAGNOSIS — F17210 Nicotine dependence, cigarettes, uncomplicated: Secondary | ICD-10-CM | POA: Diagnosis not present

## 2022-10-06 DIAGNOSIS — C50411 Malignant neoplasm of upper-outer quadrant of right female breast: Secondary | ICD-10-CM | POA: Insufficient documentation

## 2022-10-06 DIAGNOSIS — Z5111 Encounter for antineoplastic chemotherapy: Secondary | ICD-10-CM | POA: Diagnosis not present

## 2022-10-06 DIAGNOSIS — Z170 Estrogen receptor positive status [ER+]: Secondary | ICD-10-CM | POA: Insufficient documentation

## 2022-10-06 MED ORDER — GOSERELIN ACETATE 3.6 MG ~~LOC~~ IMPL
3.6000 mg | DRUG_IMPLANT | SUBCUTANEOUS | Status: DC
  Administered 2022-10-06: 3.6 mg via SUBCUTANEOUS
  Filled 2022-10-06: qty 3.6

## 2022-10-07 ENCOUNTER — Ambulatory Visit
Admission: RE | Admit: 2022-10-07 | Discharge: 2022-10-07 | Disposition: A | Discharge status: Home / Self Care | Payer: BC Managed Care – PPO | Source: Ambulatory Visit | Attending: Surgery | Admitting: Surgery

## 2022-10-07 DIAGNOSIS — R109 Unspecified abdominal pain: Secondary | ICD-10-CM | POA: Diagnosis not present

## 2022-10-07 DIAGNOSIS — K430 Incisional hernia with obstruction, without gangrene: Secondary | ICD-10-CM | POA: Insufficient documentation

## 2022-10-07 DIAGNOSIS — N200 Calculus of kidney: Secondary | ICD-10-CM | POA: Diagnosis not present

## 2022-10-09 ENCOUNTER — Telehealth: Payer: BC Managed Care – PPO | Admitting: Nurse Practitioner

## 2022-10-09 ENCOUNTER — Telehealth: Payer: BC Managed Care – PPO | Admitting: Family Medicine

## 2022-10-09 DIAGNOSIS — U071 COVID-19: Secondary | ICD-10-CM

## 2022-10-09 MED ORDER — NIRMATRELVIR/RITONAVIR (PAXLOVID)TABLET: 3.0000 | ORAL_TABLET | Freq: Two times a day (BID) | ORAL | 0 refills | Status: AC

## 2022-10-09 MED ORDER — NIRMATRELVIR/RITONAVIR (PAXLOVID)TABLET: 3.0000 | ORAL_TABLET | Freq: Two times a day (BID) | ORAL | 0 refills | Status: DC

## 2022-10-09 MED ORDER — BENZONATATE 200 MG PO CAPS: 200.0000 mg | ORAL_CAPSULE | Freq: Two times a day (BID) | ORAL | 0 refills | Status: DC | PRN

## 2022-10-09 MED ORDER — PSEUDOEPH-BROMPHEN-DM 30-2-10 MG/5ML PO SYRP: 5.0000 mL | ORAL_SOLUTION | Freq: Four times a day (QID) | ORAL | 0 refills | Status: DC | PRN

## 2022-10-09 MED ORDER — PREDNISONE 20 MG PO TABS: 20.0000 mg | ORAL_TABLET | Freq: Two times a day (BID) | ORAL | 0 refills | Status: AC

## 2022-10-09 NOTE — Progress Notes (Signed)
E-Visit  for Positive Covid Test Result  We are sorry you are not feeling well. We are here to help!  You have tested positive for COVID-19, meaning that you were infected with the novel coronavirus and could give the virus to others.  It is vitally important that you stay home so you do not spread it to others.      Please continue isolation at home, for at least 10 days since the start of your symptoms and until you have had 24 hours with no fever (without taking a fever reducer) and with improving of symptoms.  If you have no symptoms but tested positive (or all symptoms resolve after 5 days and you have no fever) you can leave your house but continue to wear a mask around others for an additional 5 days. If you have a fever,continue to stay home until you have had 24 hours of no fever. Most cases improve 5-10 days from onset but we have seen a small number of patients who have gotten worse after the 10 days.  Please be sure to watch for worsening symptoms and remain taking the proper precautions.   Go to the nearest hospital ED for assessment if fever/cough/breathlessness are severe or illness seems like a threat to life.    The following symptoms may appear 2-14 days after exposure: Fever Cough Shortness of breath or difficulty breathing Chills Repeated shaking with chills Muscle pain Headache Sore throat New loss of taste or smell Fatigue Congestion or runny nose Nausea or vomiting Diarrhea  You have been enrolled in [facility] for COVID-19. Daily you will receive a questionnaire within the [facility] website. Our COVID-19 response team will be monitoring your responses daily.  You can use medication such as prescription cough medication called Tessalon Perles 100 mg. You may take 1-2 capsules every 8 hours as needed for cough  With a history of asthma I will send paxlovid and prednisone. You may also take acetaminophen (Tylenol) as needed for fever.  HOME  CARE: Only take medications as instructed by your medical team. Drink plenty of fluids and get plenty of rest. A steam or ultrasonic humidifier can help if you have congestion.   GET HELP RIGHT AWAY IF YOU HAVE EMERGENCY WARNING SIGNS.  Call 911 or proceed to your closest emergency facility if: You develop worsening high fever. Trouble breathing Bluish lips or face Persistent pain or pressure in the chest New confusion Inability to wake or stay awake You cough up blood. Your symptoms become more severe Inability to hold down food or fluids  This list is not all possible symptoms. Contact your medical provider for any symptoms that are severe or concerning to you.    Your e-visit answers were reviewed by a board certified advanced clinical practitioner to complete your personal care plan.  Depending on the condition, your plan could have included both over the counter or prescription medications.  If there is a problem please reply once you have received a response from your provider.  Your safety is important to [place].  If you have drug allergies check your prescription carefully.    You can use MyChart to ask questions about today's visit, request a non-urgent call back, or ask for a work or school excuse for 24 hours related to this e-Visit. If it has been greater than 24 hours you will need to follow up with your provider, or enter a new e-Visit to address those concerns. You will get  an e-mail in the next two days asking about your experience.  I hope that your e-visit has been valuable and will speed your recovery. Thank you for using e-visits.    have provided 5 minutes of non face to face time during this encounter for chart review and documentation.

## 2022-10-09 NOTE — Progress Notes (Signed)
Virtual Visit Consent   [name], you are scheduled for a virtual visit with a [facility] provider today. Just as with appointments in the office, your consent must be obtained to participate. Your consent will be active for this visit and any virtual visit you may have with one of our providers in the next 365 days. If you have a MyChart account, a copy of this consent can be sent to you electronically.  As this is a virtual visit, video technology does not allow for your provider to perform a traditional examination. This may limit your provider's ability to fully assess your condition. If your provider identifies any concerns that need to be evaluated in person or the need to arrange testing (such as labs, EKG, etc.), we will make arrangements to do so. Although advances in technology are sophisticated, we cannot ensure that it will always work on either your end or our end. If the connection with a video visit is poor, the visit may have to be switched to a telephone visit. With either a video or telephone visit, we are not always able to ensure that we have a secure connection.  By engaging in this virtual visit, you consent to the provision of healthcare and authorize for your insurance to be billed (if applicable) for the services provided during this visit. Depending on your insurance coverage, you may receive a charge related to this service.  I need to obtain your verbal consent now. Are you willing to proceed with your visit today? Marylan L Grahn has provided verbal consent on 10/09/2022 for a virtual visit (video or telephone). Gildardo Pounds, NP  Date: 10/09/2022 11:01 AM  Virtual Visit via Video Note   I, Gildardo Pounds, connected with  [name]  ([id number], 05/11/1981) on 10/09/22 at 10:45 AM EST by a video-enabled telemedicine application and verified that I am speaking with the correct person using two identifiers.  Location: Patient: Virtual Visit Location  Patient: Home Provider: Virtual Visit Location Provider: Home Office   I discussed the limitations of evaluation and management by telemedicine and the availability of in person appointments. The patient expressed understanding and agreed to proceed.    History of Present Illness: [name] is a 41 y.o. who identifies as a female who was assigned female at birth, and is being seen today for COVID positive.  Tested positive for COVID today. Symptoms onset over the past 24 hours which include:  Coughing, sore throat, runny nose, headache, fever with recent temp 101.1.  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   RUQ pain 03/03/2020   Impingement syndrome of left shoulder region 12/14/2017   Asthma, stable, mild intermittent 03/22/2017   B12 deficiency 09/19/2016    Allergies:  Allergies  Allergen Reactions   Bee Venom Shortness Of Breath and Swelling    Swelling at site    Fish Allergy Anaphylaxis and Shortness Of Breath   Fish-Derived  Products Itching, Rash, Shortness Of Breath and Swelling   Latex Hives, Shortness Of Breath, Swelling and Rash   Penicillins Hives and Itching    Has patient had a PCN reaction causing immediate rash, facial/tongue/throat swelling, SOB or lightheadedness with hypotension: Yes Has patient had a PCN reaction causing severe rash involving mucus membranes or skin necrosis: Yes Has patient had a PCN reaction that required  hospitalization No Has patient had a PCN reaction occurring within the last 10 years: No If all of the above answers are "NO", then may proceed with Cephalosporin use.    Shellfish Allergy Hives   Adhesive [Tape] Other (See Comments)    Removes skin even after only 24 hours Takes off skin   Ciprofloxacin Rash    Arm redness and swelling within mins of starting IV Cipro. Treated with benadryl Can tolerate pills, not IV form   Tapentadol Other (See Comments)    Removes skin even after only 24 hours Takes off skin   Medications:  Current Outpatient Medications:    brompheniramine-pseudoephedrine-DM 30-2-10 MG/5ML syrup, Take 5 mLs by mouth 4 (four) times daily as needed., Disp: 240 mL, Rfl: 0   nirmatrelvir/ritonavir (PAXLOVID) 20 x 150 MG & 10 x '100MG'$  TABS, Take 3 tablets by mouth 2 (two) times daily for 5 days. (Take nirmatrelvir 150 mg two tablets twice daily for 5 days and ritonavir 100 mg one tablet twice daily for 5 days) Patient GFR is >60, Disp: 30 tablet, Rfl: 0   albuterol (VENTOLIN HFA) 108 (90 Base) MCG/ACT inhaler, Inhale 2 puffs into the lungs every 6 (six) hours as needed., Disp: , Rfl:    ALPRAZolam (XANAX) 0.5 MG tablet, Take 1 tablet (0.5 mg total) by mouth 3 (three) times daily as needed for anxiety., Disp: 60 tablet, Rfl: 0   Biotin w/ Vitamins C & E (HAIR/SKIN/NAILS PO), Take 1 tablet by mouth daily., Disp: , Rfl:    buPROPion (WELLBUTRIN XL) 150 MG 24 hr tablet, Take 150 mg by mouth daily., Disp: , Rfl:    celecoxib (CELEBREX) 100 MG capsule, Take 100 mg by mouth 2 (two) times daily., Disp: , Rfl:    chlorhexidine (PERIDEX) 0.12 % solution, Use as directed 10 mLs in the mouth or throat as needed., Disp: , Rfl:    diclofenac Sodium (VOLTAREN) 1 % GEL, Apply 4 g topically 4 (four) times daily., Disp: , Rfl:    EPINEPHrine 0.3 mg/0.3 mL IJ SOAJ injection, Inject 0.3 mg into the muscle once as needed., Disp: , Rfl:    exemestane (AROMASIN) 25 MG tablet, Take 1 tablet (25 mg  total) by mouth at bedtime., Disp: , Rfl:    fluticasone (FLONASE) 50 MCG/ACT nasal spray, Place 2 sprays into both nostrils as needed., Disp: , Rfl:    Goserelin Acetate (ZOLADEX [place]), Inject into the skin every 3 (three) months., Disp: , Rfl:    hydrochlorothiazide (HYDRODIURIL) 25 MG tablet, Take 25 mg by mouth daily., Disp: , Rfl:    methocarbamol (ROBAXIN) 750 MG tablet, Take 750 mg by mouth in the morning and at bedtime., Disp: , Rfl:    Multiple Vitamins-Minerals (MULTIVITAMIN WITH MINERALS) tablet, Take 1 tablet by mouth at bedtime., Disp: , Rfl:    oxyCODONE-acetaminophen (PERCOCET) 5-325 MG tablet, Take 1 tablet by mouth every 8 (eight) hours as needed for severe pain., Disp: 15 tablet, Rfl: 0   pregabalin (LYRICA) 75 MG capsule, Take 1 capsule (75 mg total) by mouth 3 (three) times daily. 1 cap in  am and 2 qhs, Disp: 90 capsule, Rfl: 2   prochlorperazine (COMPAZINE) 10 MG tablet, Take 1 tablet (10 mg total) by mouth every 6 (six) hours as needed (Nausea or vomiting)., Disp: 30 tablet, Rfl: 1   promethazine (PHENERGAN) 25 MG tablet, Take 0.5-1 tablets (12.5-25 mg total) by mouth every 6 (six) hours as needed for nausea or vomiting., Disp: 30 tablet, Rfl: 0   sertraline (ZOLOFT) 100 MG tablet, Take 100 mg by mouth at bedtime., Disp: , Rfl:    sodium fluoride (FLUORISHIELD) 1.1 % GEL dental gel, See admin instructions., Disp: , Rfl:    traZODone (DESYREL) 100 MG tablet, Take 100 mg by mouth at bedtime., Disp: , Rfl:    zoledronic acid (ZOMETA) 4 MG/5ML injection, Inject into the vein. Every 6 months; due September 05, 2022, Disp: , Rfl:  No current facility-administered medications for this visit.  Facility-Administered Medications Ordered in Other Visits:    leuprolide (LUPRON) injection 11.25 mg, 11.25 mg, Intramuscular, Q90 days, Sindy Guadeloupe, MD, 11.25 mg at 02/25/21 1532   leuprolide (LUPRON) injection 11.25 mg, 11.25 mg, Intramuscular, Q90 days, Sindy Guadeloupe, MD, 11.25 mg at  03/02/22 1545  Observations/Objective: Patient is well-developed, well-nourished in no acute distress.  Resting comfortably in bed at home.  Head is normocephalic, atraumatic.  No labored breathing.  Speech is clear and coherent with logical content.  Patient is alert and oriented at baseline.    Assessment and Plan: 1. Positive self-administered antigen test for COVID-19 - nirmatrelvir/ritonavir (PAXLOVID) 20 x 150 MG & 10 x '100MG'$  TABS; Take 3 tablets by mouth 2 (two) times daily for 5 days. (Take nirmatrelvir 150 mg two tablets twice daily for 5 days and ritonavir 100 mg one tablet twice daily for 5 days) Patient GFR is >60  Dispense: 30 tablet; Refill: 0 - brompheniramine-pseudoephedrine-DM 30-2-10 MG/5ML syrup; Take 5 mLs by mouth 4 (four) times daily as needed.  Dispense: 240 mL; Refill: 0   Please keep well-hydrated and get plenty of rest. Start a saline nasal rinse to flush out your nasal passages. You can use plain Mucinex to help thin congestion. If you have a humidifier, you can use this daily as needed.    You are to wear a mask for 5 days from onset of your symptoms.  After day 5, if you have had no fever and you are feeling better with NO symptoms, you can end masking. Keep in mind you can be contagious 10 days from the onset of symptoms  After day 5 if you have a fever or are having significant symptoms, please wear your mask for full 10 days.   If you note any worsening of symptoms, any significant shortness of breath or any chest pain, please seek ER evaluation ASAP.  Please do not delay care!    If you note any worsening of symptoms, any significant shortness of breath or any chest pain, please seek ER evaluation ASAP.  Please do not delay care!   Follow Up Instructions: I discussed the assessment and treatment plan with the patient. The patient was provided an opportunity to ask questions and all were answered. The patient agreed with the plan and demonstrated an  understanding of the instructions.  A copy of instructions were sent to the patient via MyChart unless otherwise noted below.    The patient was advised to call back or seek an in-person evaluation if the symptoms worsen or if the condition fails to improve as anticipated.  Time:  I spent 12 minutes with the patient via telehealth technology discussing the above problems/concerns.    Gildardo Pounds, NP

## 2022-10-09 NOTE — Patient Instructions (Signed)
[name], thank you for joining Gildardo Pounds, NP for today's virtual visit.  While this provider is not your primary care provider (PCP), if your PCP is located in our provider database this encounter information will be shared with them immediately following your visit.   [facility] account gives you access to today's visit and all your visits, tests, and labs performed at [facility] " click here if you don't have a [facility] account or go to [email or web address]  Consent: (Patient) [name] provided verbal consent for this virtual visit at the beginning of the encounter.  Current Medications:  Current Outpatient Medications:    brompheniramine-pseudoephedrine-DM 30-2-10 MG/5ML syrup, Take 5 mLs by mouth 4 (four) times daily as needed., Disp: 240 mL, Rfl: 0   nirmatrelvir/ritonavir (PAXLOVID) 20 x 150 MG & 10 x '100MG'$  TABS, Take 3 tablets by mouth 2 (two) times daily for 5 days. (Take nirmatrelvir 150 mg two tablets twice daily for 5 days and ritonavir 100 mg one tablet twice daily for 5 days) Patient GFR is >60, Disp: 30 tablet, Rfl: 0   albuterol (VENTOLIN HFA) 108 (90 Base) MCG/ACT inhaler, Inhale 2 puffs into the lungs every 6 (six) hours as needed., Disp: , Rfl:    ALPRAZolam (XANAX) 0.5 MG tablet, Take 1 tablet (0.5 mg total) by mouth 3 (three) times daily as needed for anxiety., Disp: 60 tablet, Rfl: 0   Biotin w/ Vitamins C & E (HAIR/SKIN/NAILS PO), Take 1 tablet by mouth daily., Disp: , Rfl:    buPROPion (WELLBUTRIN XL) 150 MG 24 hr tablet, Take 150 mg by mouth daily., Disp: , Rfl:    celecoxib (CELEBREX) 100 MG capsule, Take 100 mg by mouth 2 (two) times daily., Disp: , Rfl:    chlorhexidine (PERIDEX) 0.12 % solution, Use as directed 10 mLs in the mouth or throat as needed., Disp: , Rfl:    diclofenac Sodium (VOLTAREN) 1 % GEL, Apply 4 g topically 4 (four) times daily., Disp: , Rfl:    EPINEPHrine 0.3 mg/0.3 mL IJ SOAJ  injection, Inject 0.3 mg into the muscle once as needed., Disp: , Rfl:    exemestane (AROMASIN) 25 MG tablet, Take 1 tablet (25 mg total) by mouth at bedtime., Disp: , Rfl:    fluticasone (FLONASE) 50 MCG/ACT nasal spray, Place 2 sprays into both nostrils as needed., Disp: , Rfl:    Goserelin Acetate (ZOLADEX [place]), Inject into the skin every 3 (three) months., Disp: , Rfl:    hydrochlorothiazide (HYDRODIURIL) 25 MG tablet, Take 25 mg by mouth daily., Disp: , Rfl:    methocarbamol (ROBAXIN) 750 MG tablet, Take 750 mg by mouth in the morning and at bedtime., Disp: , Rfl:    Multiple Vitamins-Minerals (MULTIVITAMIN WITH MINERALS) tablet, Take 1 tablet by mouth at bedtime., Disp: , Rfl:    oxyCODONE-acetaminophen (PERCOCET) 5-325 MG tablet, Take 1 tablet by mouth every 8 (eight) hours as needed for severe pain., Disp: 15 tablet, Rfl: 0   pregabalin (LYRICA) 75 MG capsule, Take 1 capsule (75 mg total) by mouth 3 (three) times daily. 1 cap in am and 2 qhs, Disp: 90 capsule, Rfl: 2   prochlorperazine (COMPAZINE) 10 MG tablet, Take 1 tablet (10 mg total) by mouth every 6 (six) hours as needed (Nausea or vomiting)., Disp: 30 tablet, Rfl: 1   promethazine (PHENERGAN) 25 MG tablet, Take 0.5-1 tablets (12.5-25 mg total) by mouth every 6 (six) hours as needed for nausea or vomiting.,  Disp: 30 tablet, Rfl: 0   sertraline (ZOLOFT) 100 MG tablet, Take 100 mg by mouth at bedtime., Disp: , Rfl:    sodium fluoride (FLUORISHIELD) 1.1 % GEL dental gel, See admin instructions., Disp: , Rfl:    traZODone (DESYREL) 100 MG tablet, Take 100 mg by mouth at bedtime., Disp: , Rfl:    zoledronic acid (ZOMETA) 4 MG/5ML injection, Inject into the vein. Every 6 months; due September 05, 2022, Disp: , Rfl:  No current facility-administered medications for this visit.  Facility-Administered Medications Ordered in Other Visits:    leuprolide (LUPRON) injection 11.25 mg, 11.25 mg, Intramuscular, Q90 days, Sindy Guadeloupe, MD, 11.25 mg at  02/25/21 1532   leuprolide (LUPRON) injection 11.25 mg, 11.25 mg, Intramuscular, Q90 days, Sindy Guadeloupe, MD, 11.25 mg at 03/02/22 1545   Medications ordered in this encounter:  Meds ordered this encounter  Medications   nirmatrelvir/ritonavir (PAXLOVID) 20 x 150 MG & 10 x '100MG'$  TABS    Sig: Take 3 tablets by mouth 2 (two) times daily for 5 days. (Take nirmatrelvir 150 mg two tablets twice daily for 5 days and ritonavir 100 mg one tablet twice daily for 5 days) Patient GFR is >60    Dispense:  30 tablet    Refill:  0    Order Specific Question:   Supervising Provider    Answer:   Chase Picket [[id number]]   brompheniramine-pseudoephedrine-DM 30-2-10 MG/5ML syrup    Sig: Take 5 mLs by mouth 4 (four) times daily as needed.    Dispense:  240 mL    Refill:  0    Order Specific Question:   Supervising Provider    Answer:   Chase Picket [[id number]]     *If you need refills on other medications prior to your next appointment, please contact your pharmacy*  Follow-Up: Call back or seek an in-person evaluation if the symptoms worsen or if the condition fails to improve as anticipated.  [place] [phone]  Other Instructions  Please keep well-hydrated and get plenty of rest. Start a saline nasal rinse to flush out your nasal passages. You can use plain Mucinex to help thin congestion. If you have a humidifier, you can use this daily as needed.    You are to wear a mask for 5 days from onset of your symptoms.  After day 5, if you have had no fever and you are feeling better with NO symptoms, you can end masking. Keep in mind you can be contagious 10 days from the onset of symptoms  After day 5 if you have a fever or are having significant symptoms, please wear your mask for full 10 days.   If you note any worsening of symptoms, any significant shortness of breath or any chest pain, please seek ER evaluation ASAP.  Please do not delay care!    If you note any  worsening of symptoms, any significant shortness of breath or any chest pain, please seek ER evaluation ASAP.  Please do not delay care!    If you have been instructed to have an in-person evaluation today at a local Urgent Care facility, please use the link below. It will take you to a list of all of our available [facility], including address, phone number and hours of operation. Please do not delay care.  [facility]  If you or a family member do not have a primary care provider, use the link below  to schedule a visit and establish care. When you choose a [facility] primary care physician or advanced practice provider, you gain a long-term partner in health. Find a Primary Care Provider  Learn more about [facility] in-office and virtual care options: [facility]

## 2022-10-11 DIAGNOSIS — U071 COVID-19: Secondary | ICD-10-CM | POA: Diagnosis not present

## 2022-10-14 ENCOUNTER — Ambulatory Visit: Payer: BC Managed Care – PPO | Admitting: Plastic Surgery

## 2022-10-22 ENCOUNTER — Telehealth: Payer: BC Managed Care – PPO | Admitting: Nurse Practitioner

## 2022-10-22 DIAGNOSIS — J111 Influenza due to unidentified influenza virus with other respiratory manifestations: Secondary | ICD-10-CM

## 2022-10-22 MED ORDER — OSELTAMIVIR PHOSPHATE 75 MG PO CAPS: 75.0000 mg | ORAL_CAPSULE | Freq: Two times a day (BID) | ORAL | 0 refills | Status: DC

## 2022-10-22 NOTE — Progress Notes (Signed)
E visit for Flu like symptoms   We are sorry that you are not feeling well.  Here is how we plan to help! Based on what you have shared with me it looks like you may have flu-like symptoms that should be watched but do not seem to indicate anti-viral treatment.  Influenza or "the flu" is   an infection caused by a respiratory virus. The flu virus is highly contagious and persons who did not receive their yearly flu vaccination may "catch" the flu from close contact.  We have anti-viral medications to treat the viruses that cause this infection. They are not a "cure" and only shorten the course of the infection. These prescriptions are most effective when they are given within the first 2 days of "flu" symptoms. Antiviral medication are indicated if you have a high risk of complications from the flu. You should  also consider an antiviral medication if you are in close contact with someone who is at risk. These medications can help patients avoid complications from the flu  but have side effects that you should know. Possible side effects from Tamiflu or oseltamivir include nausea, vomiting, diarrhea, dizziness, headaches, eye redness, sleep problems or other respiratory symptoms. You should not take Tamiflu if you have an allergy to oseltamivir or any to the ingredients in Tamiflu.  Based upon your symptoms and potential risk factors I have prescribed Oseltamivir (Tamiflu).  It has been sent to your designated pharmacy.  You will take one 75 mg capsule orally twice a day for the next 5 days.  ANYONE WHO HAS FLU SYMPTOMS SHOULD: Stay home. The flu is highly contagious and going out or to work exposes others! Be sure to drink plenty of fluids. Water is fine as well as fruit juices, sodas and electrolyte beverages. You may want to stay away from caffeine or alcohol. If you are nauseated, try taking small sips of liquids. How do you know if you are getting enough fluid? Your urine should be a pale yellow  or almost colorless. Get rest. Taking a steamy shower or using a humidifier may help nasal congestion and ease sore throat pain. Using a saline nasal spray works much the same way. Cough drops, hard candies and sore throat lozenges may ease your cough. Line up a caregiver. Have someone check on you regularly.   GET HELP RIGHT AWAY IF: You cannot keep down liquids or your medications. You become short of breath Your fell like you are going to pass out or loose consciousness. Your symptoms persist after you have completed your treatment plan MAKE SURE YOU  Understand these instructions. Will watch your condition. Will get help right away if you are not doing well or get worse.  Your e-visit answers were reviewed by a board certified advanced clinical practitioner to complete your personal care plan.  Depending on the condition, your plan could have included both over the counter or prescription medications.  If there is a problem please reply  once you have received a response from your provider.  Your safety is important to us.  If you have drug allergies check your prescription carefully.    You can use MyChart to ask questions about today's visit, request a non-urgent call back, or ask for a work or school excuse for 24 hours related to this e-Visit. If it has been greater than 24 hours you will need to follow up with your provider, or enter a new e-Visit to address those concerns.  You   will get an e-mail in the next two days asking about your experience.  I hope that your e-visit has been valuable and will speed your recovery. Thank you for using e-visits.    Mary-Margaret Daryan Buell, [name]   5-10 minutes spent reviewing and documenting in chart.  

## 2022-10-23 ENCOUNTER — Encounter: Payer: Self-pay | Admitting: Oncology

## 2022-10-23 DIAGNOSIS — S99922A Unspecified injury of left foot, initial encounter: Secondary | ICD-10-CM | POA: Diagnosis not present

## 2022-10-24 ENCOUNTER — Other Ambulatory Visit: Payer: Self-pay

## 2022-10-24 MED ORDER — PREGABALIN 75 MG PO CAPS: 75.0000 mg | ORAL_CAPSULE | Freq: Two times a day (BID) | ORAL | 2 refills | Status: DC

## 2022-10-24 MED ORDER — PREGABALIN 75 MG PO CAPS: 75.0000 mg | ORAL_CAPSULE | Freq: Three times a day (TID) | ORAL | 2 refills | Status: DC

## 2022-10-24 NOTE — Telephone Encounter (Signed)
Ok to send new prescription with 75 mg morning and 150 QHS

## 2022-10-27 ENCOUNTER — Telehealth: Payer: Self-pay | Admitting: *Deleted

## 2022-10-27 NOTE — Telephone Encounter (Signed)
Case manager sent a note about the goals for the pt to have wt lose, and get out of pain. I sent the last note from MD. 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 Not sure if that will get better and there are times that it gets better but it is usualyl a long amount of time. Then the wt. Part I sent the message that she can have an appt with nutrition appt if she would like to start.faxed the notes and it went through transmission with fax

## 2022-11-02 DIAGNOSIS — M722 Plantar fascial fibromatosis: Secondary | ICD-10-CM | POA: Diagnosis not present

## 2022-11-07 ENCOUNTER — Telehealth: Payer: Self-pay | Admitting: *Deleted

## 2022-11-07 ENCOUNTER — Inpatient Hospital Stay: Payer: BC Managed Care – PPO | Attending: Oncology

## 2022-11-07 DIAGNOSIS — Z79811 Long term (current) use of aromatase inhibitors: Secondary | ICD-10-CM | POA: Diagnosis not present

## 2022-11-07 DIAGNOSIS — C50811 Malignant neoplasm of overlapping sites of right female breast: Secondary | ICD-10-CM | POA: Insufficient documentation

## 2022-11-07 DIAGNOSIS — Z170 Estrogen receptor positive status [ER+]: Secondary | ICD-10-CM | POA: Insufficient documentation

## 2022-11-07 DIAGNOSIS — Z5111 Encounter for antineoplastic chemotherapy: Secondary | ICD-10-CM | POA: Diagnosis not present

## 2022-11-07 DIAGNOSIS — C50411 Malignant neoplasm of upper-outer quadrant of right female breast: Secondary | ICD-10-CM

## 2022-11-07 MED ORDER — GOSERELIN ACETATE 3.6 MG ~~LOC~~ IMPL
3.6000 mg | DRUG_IMPLANT | SUBCUTANEOUS | Status: DC
  Administered 2022-11-07: 3.6 mg via SUBCUTANEOUS
  Filled 2022-11-07: qty 3.6

## 2022-11-07 NOTE — Telephone Encounter (Signed)
Pt states that her apple watch has pulse 119. I put on pulse Oximeter and shows the rythmn. She said she has not had a bad day. She has benn drinking good. As she sat witing for inj. She also say said that she did not put her cream to numb area before the inj. Her heart rate 105. I spoke to Janese Banks and she said that if it continues to see PCP and see if anything needs to be checked on

## 2022-11-14 DIAGNOSIS — M79672 Pain in left foot: Secondary | ICD-10-CM | POA: Diagnosis not present

## 2022-11-14 DIAGNOSIS — M76822 Posterior tibial tendinitis, left leg: Secondary | ICD-10-CM | POA: Diagnosis not present

## 2022-11-14 DIAGNOSIS — M722 Plantar fascial fibromatosis: Secondary | ICD-10-CM | POA: Diagnosis not present

## 2022-11-14 DIAGNOSIS — R262 Difficulty in walking, not elsewhere classified: Secondary | ICD-10-CM | POA: Diagnosis not present

## 2022-11-21 ENCOUNTER — Ambulatory Visit (INDEPENDENT_AMBULATORY_CARE_PROVIDER_SITE_OTHER): Payer: BC Managed Care – PPO | Admitting: Physician Assistant

## 2022-11-21 ENCOUNTER — Encounter: Payer: Self-pay | Admitting: Physician Assistant

## 2022-11-21 VITALS — BP 134/89 | HR 95

## 2022-11-21 DIAGNOSIS — Z9013 Acquired absence of bilateral breasts and nipples: Secondary | ICD-10-CM | POA: Diagnosis not present

## 2022-11-21 DIAGNOSIS — Z853 Personal history of malignant neoplasm of breast: Secondary | ICD-10-CM | POA: Diagnosis not present

## 2022-11-21 DIAGNOSIS — Z170 Estrogen receptor positive status [ER+]: Secondary | ICD-10-CM

## 2022-11-21 DIAGNOSIS — C50411 Malignant neoplasm of upper-outer quadrant of right female breast: Secondary | ICD-10-CM

## 2022-11-21 NOTE — Progress Notes (Signed)
NIPPLE AREOLAR TATTOO PROCEDURE   PREOPERATIVE DIAGNOSIS:  Acquired absence of bilateral nipple areolar    POSTOPERATIVE DIAGNOSIS: Acquired absence of bilateral nipple areolar     PROCEDURES: Bilateral nipple areolar tattoo    ANESTHESIA:  EMLA, 4% Lidocaine with epinephrine   COMPLICATIONS: None.   JUSTIFICATION FOR PROCEDURE:  [name] is a 42 y.o. female with a history of breast cancer status post breast reconstruction. The patient presents for nipple areolar complex tattoo. Risks, benefits, indications, and alternatives of the above described procedures were discussed with the patient and all the patient's questions were answered.    05/20/2022  She has a history of bilateral mastectomies with expander reconstruction and ultimate implant exchange performed 01/06/2021 by Dr. Marla Roe.  At the beginning of the encounter, we discussed her goals for nipple areolar tattooing.  She had considered possible options including non-areolar postmastectomy tattoos, but reports that she has been eagerly awaiting her opportunity for nipple areolar tattooing.     With regard to possible colors, she states that she would like to be similar to how she was prior to her bilateral mastectomies.  Reviewed images from 07/2020.  She states that her only preference is that the color is slightly less pink and more tan/darker than how she was preoperatively.  We also discussed positioning of her nipples and have decided that they would be higher and more centered on the breast than they were preoperatively.  She is more of a Fitzpatrick 1-2, but has recently dyed her hair dark brown/black and has been going to a tanning bed.   07/29/2022 Patient reports that she has had considerable fading of the areola, but the inferior shading remained prominent.  She was quite pleased with the color at the conclusion of her last visit and is not looking to make changes with color of areola.  She is hoping that she can have  the nipple color done today.  Discussed slightly darker shade than the areola to add contrast.  Additionally, discussed placement of Montgomery tubercles for added detail.  Given the prominence of inferior nipple shading from initial encounter, will not add any additional shading today.  Overall, she is pleased.   09/23/2022 When patient came in today, she did have some concerns.  She told me that the areola color has faded since last visit and appears to peachy rather than pink that she had preoperatively.  While she states that she is a natural redhead with fair skin that burns easily, recently she has been tanning and dying her hair dark.  Discussed trying to go from a peachy/pink areolar color to a slightly tender collar for more natural outcome.  She is completely in agreement and is willing to go darker.  Mixed completely new colors and she was agreeable with proceeding in the new direction.  She also states that she would like for the nipple to be darker relative to the areola.  12/5 Today, patient states that her areola color from last treatment faded quickly light leaving her with the same peach ring areola from before.  She is now excepting that we need to go even darker with her areolar restoration tattooing.  After discussing with patient, we will focus on both the areola and nipple colors today, defer nipple shading and Montgomery tubercles to a subsequent encounter.  She did say that she was interested in additional detailing with spiral nipple shading.     DESCRIPTION OF PROCEDURE: After written informed consent was obtained and proper identification  of patient and surgical site was made, the patient was taken to the procedure room and placed supine on the operating room table. A time out was performed to confirm patient's identity and surgical site. The patient was prepped and draped in the usual sterile fashion. Alcohol swabs were used. Attention was turned to the selection of flesh colored  permanent tattoo ink to produce the appropriate color for nipple areolar complex tattooing. Using a digital revo 7R tattoo head, pointillism pigment was instilled to the designed nipple areolar complex which was confirmed preoperatively with the patient using the Digital-Pop Deluxe machine by [employer]. Spiral technique was used to fill out the areola.  Attention was then directed towards the nipples and utilization of slightly darker pigment for contrast.  Once adequate pigment had been applied to the nipple areolar complex, Xeroform dressing was applied followed by 4 x 4 gauze and secured with Medipore tape. There were no complications and the procedure was tolerated without difficulty.     Areola-color: Fair Honey (2), Cool Honey (1), and Tan Peach (1) in 2:1:1 ratio. Nipple color: Fair Honey (1), Cool Honey (2), and Tan Peach (2) in 1:2:2 ratio. Montgomery Tubercles: Bright Peach (2), Fair Honey (2), and Portrait White (1) in 2:2:1 ratio respectively. Did not perform at today's visit. Nipple shading: Dark Tan (1), Dark Honey (1), and Tan Honey 4 in 1:1:4 ratio. Did not perform at today's visit.  Consider just cool honey and cool peach if any additional shading required at next encounter. Colors EXP: 10/2024   Nipples are each approximately 4.25 cm diameter at conclusion of visit.   Return in 8-12 weeks for 1 hour scheduled touch-up for possible nipple shading and montgomery tubercles.     Picture(s) obtained of the patient and placed in the chart were with the patient's or guardian's permission.

## 2022-12-02 DIAGNOSIS — U071 COVID-19: Secondary | ICD-10-CM | POA: Diagnosis not present

## 2022-12-02 DIAGNOSIS — F419 Anxiety disorder, unspecified: Secondary | ICD-10-CM | POA: Diagnosis not present

## 2022-12-02 DIAGNOSIS — J988 Other specified respiratory disorders: Secondary | ICD-10-CM | POA: Insufficient documentation

## 2022-12-02 DIAGNOSIS — F321 Major depressive disorder, single episode, moderate: Secondary | ICD-10-CM | POA: Diagnosis not present

## 2022-12-02 DIAGNOSIS — N898 Other specified noninflammatory disorders of vagina: Secondary | ICD-10-CM | POA: Diagnosis not present

## 2022-12-08 ENCOUNTER — Other Ambulatory Visit: Payer: Self-pay

## 2022-12-08 ENCOUNTER — Inpatient Hospital Stay: Payer: BC Managed Care – PPO | Attending: Oncology

## 2022-12-08 DIAGNOSIS — Z5111 Encounter for antineoplastic chemotherapy: Secondary | ICD-10-CM | POA: Diagnosis not present

## 2022-12-08 DIAGNOSIS — Z170 Estrogen receptor positive status [ER+]: Secondary | ICD-10-CM | POA: Diagnosis not present

## 2022-12-08 DIAGNOSIS — C50811 Malignant neoplasm of overlapping sites of right female breast: Secondary | ICD-10-CM | POA: Insufficient documentation

## 2022-12-08 DIAGNOSIS — C50411 Malignant neoplasm of upper-outer quadrant of right female breast: Secondary | ICD-10-CM

## 2022-12-08 MED ORDER — GOSERELIN ACETATE 3.6 MG ~~LOC~~ IMPL
3.6000 mg | DRUG_IMPLANT | SUBCUTANEOUS | Status: DC
  Administered 2022-12-08: 3.6 mg via SUBCUTANEOUS
  Filled 2022-12-08: qty 3.6

## 2022-12-14 ENCOUNTER — Encounter: Payer: Self-pay | Admitting: Oncology

## 2022-12-19 ENCOUNTER — Telehealth (INDEPENDENT_AMBULATORY_CARE_PROVIDER_SITE_OTHER): Payer: BC Managed Care – PPO | Admitting: Physician Assistant

## 2022-12-19 DIAGNOSIS — C50411 Malignant neoplasm of upper-outer quadrant of right female breast: Secondary | ICD-10-CM

## 2022-12-19 DIAGNOSIS — Z9013 Acquired absence of bilateral breasts and nipples: Secondary | ICD-10-CM

## 2022-12-19 DIAGNOSIS — Z170 Estrogen receptor positive status [ER+]: Secondary | ICD-10-CM

## 2022-12-19 NOTE — Progress Notes (Signed)
Patient is a pleasant 42 year old female with PMH of right-sided breast cancer s/p double mastectomy and reconstruction who has been coming to the clinic for nipple areolar tattoo restoration treatments.  She was last seen here in the office on 11/21/2022.  At that time, decision was made to go darker with the areolar color.  She continues to experience feeding in between sessions leaving her with a peachy hue.  The nipple shading has remained fully intact and without any fading.  The nipples themselves have been darkened to patient's preference.  The patient, [name], gave permission to have this encounter performed via telemedicine, two identifiers were used to confirm patient's identity.  They also consented to chart review and treatment via this encounter.  The patient was at work and this provider was calling from their office.  A total of 10 minutes was spent speaking with the patient and reviewing chart.    Today, patient shared photos of her tattoos.  They do appear to be far less peachy than the photo obtained 11/21/2022 prior to tattoo session.  She is pleased with the new color, but states that she would like for it to be done again due to fading.  She also is interested in additional detailing including possible Montgomery tubercles.  Overall, she feels as though there has been some improvement after her last session.  She already has a visit scheduled for 01/17/2023.  She will call if she has any questions or concerns in the interim.

## 2022-12-27 ENCOUNTER — Ambulatory Visit (INDEPENDENT_AMBULATORY_CARE_PROVIDER_SITE_OTHER): Payer: BC Managed Care – PPO | Admitting: Plastic Surgery

## 2022-12-27 ENCOUNTER — Encounter: Payer: Self-pay | Admitting: Plastic Surgery

## 2022-12-27 VITALS — BP 124/74 | HR 89

## 2022-12-27 DIAGNOSIS — N6489 Other specified disorders of breast: Secondary | ICD-10-CM

## 2022-12-27 DIAGNOSIS — Z9013 Acquired absence of bilateral breasts and nipples: Secondary | ICD-10-CM

## 2022-12-27 NOTE — Progress Notes (Signed)
   Subjective:    Patient ID: [name], female    DOB: 1980/11/27, 42 y.o.   MRN: [id number]  The patient is a 42 year old female here for follow-up on her breast reconstruction.  She had invasive mammary carcinoma of the right breast.  It was estrogen and progesterone positive and HER2 negative.  She was seen by Dr. [name] for the general surgery part and underwent bilateral mastectomies with reconstruction in 2021.  She has [place] smooth round ultrahigh profile gel 750 cc implants in place.  He is 5 feet 7 inches tall.  She is currently on Ozempic and has lost around 10 pounds already.  Overall she is happy with her reconstruction and is going through the nipple areola tattooing process now.  She still has a little bit of asymmetry and dimpling which she would like to improve upon.  She is also getting a new job in April.      Review of Systems  Constitutional: Negative.   Eyes: Negative.   Respiratory: Negative.  Negative for chest tightness and shortness of breath.   Cardiovascular: Negative.  Negative for leg swelling.  Gastrointestinal: Negative.   Endocrine: Negative.   Genitourinary: Negative.   Musculoskeletal: Negative.        Objective:   Physical Exam Vitals and nursing note reviewed.  Constitutional:      Appearance: Normal appearance.  HENT:     Head: Normocephalic and atraumatic.  Cardiovascular:     Rate and Rhythm: Normal rate.     Pulses: Normal pulses.  Pulmonary:     Effort: Pulmonary effort is normal.  Abdominal:     General: There is no distension.     Palpations: Abdomen is soft.     Tenderness: There is no abdominal tenderness.  Skin:    General: Skin is warm.     Capillary Refill: Capillary refill takes less than 2 seconds.     Coloration: Skin is not jaundiced.  Neurological:     Mental Status: She is alert and oriented to person, place, and time.  Psychiatric:        Mood and Affect: Mood normal.        Behavior: Behavior normal.         Assessment & Plan:     ICD-10-CM   1. Breast asymmetry  N64.89     2. Absence of breast, acquired, bilateral  Z90.13        The patient is a good candidate for bilateral lipo filling.  She is aware that all the fat does not survive each time so several procedures may be needed.  Pictures were obtained of the patient and placed in the chart with the patient's or guardian's permission.

## 2022-12-28 ENCOUNTER — Telehealth: Payer: BC Managed Care – PPO | Admitting: Physician Assistant

## 2022-12-28 DIAGNOSIS — A084 Viral intestinal infection, unspecified: Secondary | ICD-10-CM | POA: Diagnosis not present

## 2022-12-28 MED ORDER — DICYCLOMINE HCL 10 MG PO CAPS: 10.0000 mg | ORAL_CAPSULE | Freq: Three times a day (TID) | ORAL | 0 refills | Status: AC

## 2022-12-28 MED ORDER — ONDANSETRON 4 MG PO TBDP: 4.0000 mg | ORAL_TABLET | Freq: Three times a day (TID) | ORAL | 0 refills | Status: DC | PRN

## 2022-12-28 NOTE — Progress Notes (Signed)
We are sorry that you are not feeling well.  Here is how we plan to help!  Based on what you have shared with me it looks like you have Acute Infectious Diarrhea.  Most cases of acute diarrhea are due to infections with virus and bacteria and are self-limited conditions lasting less than 14 days.  For your symptoms you may take Imodium 2 mg tablets that are over the counter at your local pharmacy. Take two tablet now and then one after each loose stool up to 6 a day.  Antibiotics are not needed for most people with diarrhea.  Optional: Zofran 4 mg 1 tablet every 8 hours as needed for nausea and vomiting  Optional: Dicyclomine '10mg'$  Take 1 tablet before each meal and 1 tablet at bedtime as needed for cramping and diarrhea.   HOME CARE We recommend changing your diet to help with your symptoms for the next few days. Drink plenty of fluids that contain water salt and sugar. Sports drinks such as Gatorade may help.  You may try broths, soups, bananas, applesauce, soft breads, mashed potatoes or crackers.  You are considered infectious for as long as the diarrhea continues. Hand washing or use of alcohol based hand sanitizers is recommend. It is best to stay out of work or school until your symptoms stop.   GET HELP RIGHT AWAY If you have dark yellow colored urine or do not pass urine frequently you should drink more fluids.   If your symptoms worsen  If you feel like you are going to pass out (faint) You have a new problem  MAKE SURE YOU  Understand these instructions. Will watch your condition. Will get help right away if you are not doing well or get worse.  Thank you for choosing an e-visit.  Your e-visit answers were reviewed by a board certified advanced clinical practitioner to complete your personal care plan. Depending upon the condition, your plan could have included both over the counter or prescription medications.  Please review your pharmacy choice. Make sure the pharmacy  is open so you can pick up prescription now. If there is a problem, you may contact your provider through [employer] and have the prescription routed to another pharmacy.  Your safety is important to [place]. If you have drug allergies check your prescription carefully.   For the next 24 hours you can use MyChart to ask questions about today's visit, request a non-urgent call back, or ask for a work or school excuse. You will get an email in the next two days asking about your experience. I hope that your e-visit has been valuable and will speed your recovery.  I have spent 5 minutes in review of e-visit questionnaire, review and updating patient chart, medical decision making and response to patient.   Mar Daring, PA-C

## 2023-01-06 ENCOUNTER — Inpatient Hospital Stay: Payer: BC Managed Care – PPO

## 2023-01-10 ENCOUNTER — Inpatient Hospital Stay: Payer: BC Managed Care – PPO | Attending: Oncology

## 2023-01-10 DIAGNOSIS — C50811 Malignant neoplasm of overlapping sites of right female breast: Secondary | ICD-10-CM | POA: Insufficient documentation

## 2023-01-10 DIAGNOSIS — Z79811 Long term (current) use of aromatase inhibitors: Secondary | ICD-10-CM | POA: Insufficient documentation

## 2023-01-10 DIAGNOSIS — Z170 Estrogen receptor positive status [ER+]: Secondary | ICD-10-CM | POA: Diagnosis not present

## 2023-01-10 DIAGNOSIS — Z5111 Encounter for antineoplastic chemotherapy: Secondary | ICD-10-CM | POA: Diagnosis present

## 2023-01-10 MED ORDER — GOSERELIN ACETATE 3.6 MG ~~LOC~~ IMPL
3.6000 mg | DRUG_IMPLANT | SUBCUTANEOUS | Status: DC
  Filled 2023-01-10: qty 3.6

## 2023-01-17 ENCOUNTER — Ambulatory Visit (INDEPENDENT_AMBULATORY_CARE_PROVIDER_SITE_OTHER): Payer: BC Managed Care – PPO | Admitting: Physician Assistant

## 2023-01-17 DIAGNOSIS — Z170 Estrogen receptor positive status [ER+]: Secondary | ICD-10-CM

## 2023-01-17 DIAGNOSIS — Z9013 Acquired absence of bilateral breasts and nipples: Secondary | ICD-10-CM | POA: Diagnosis not present

## 2023-01-17 NOTE — Progress Notes (Signed)
NIPPLE AREOLAR TATTOO PROCEDURE   PREOPERATIVE DIAGNOSIS:  Acquired absence of bilateral nipple areolar    POSTOPERATIVE DIAGNOSIS: Acquired absence of bilateral nipple areolar     PROCEDURES: Bilateral nipple areolar tattoo    ANESTHESIA:  EMLA, 4% Lidocaine with epinephrine   COMPLICATIONS: None.   JUSTIFICATION FOR PROCEDURE:  Ms. Lapan is a 42 y.o. female with a history of breast cancer status post breast reconstruction. The patient presents for nipple areolar complex tattoo. Risks, benefits, indications, and alternatives of the above described procedures were discussed with the patient and all the patient's questions were answered.    05/20/2022  She has a history of bilateral mastectomies with expander reconstruction and ultimate implant exchange performed 01/06/2021 by Dr. Marla Roe.  At the beginning of the encounter, we discussed her goals for nipple areolar tattooing.  She had considered possible options including non-areolar postmastectomy tattoos, but reports that she has been eagerly awaiting her opportunity for nipple areolar tattooing.     With regard to possible colors, she states that she would like to be similar to how she was prior to her bilateral mastectomies.  Reviewed images from 07/2020.  She states that her only preference is that the color is slightly less pink and more tan/darker than how she was preoperatively.  We also discussed positioning of her nipples and have decided that they would be higher and more centered on the breast than they were preoperatively.  She is more of a Fitzpatrick 1-2, but has recently dyed her hair dark brown/black and has been going to a tanning bed.   07/29/2022 Patient reports that she has had considerable fading of the areola, but the inferior shading remained prominent.  She was quite pleased with the color at the conclusion of her last visit and is not looking to make changes with color of areola.  She is hoping that she can have  the nipple color done today.  Discussed slightly darker shade than the areola to add contrast.  Additionally, discussed placement of Montgomery tubercles for added detail.  Given the prominence of inferior nipple shading from initial encounter, will not add any additional shading today.  Overall, she is pleased.   09/23/2022 When patient came in today, she did have some concerns.  She told me that the areola color has faded since last visit and appears to peachy rather than pink that she had preoperatively.  While she states that she is a natural redhead with fair skin that burns easily, recently she has been tanning and dying her hair dark.  Discussed trying to go from a peachy/pink areolar color to a slightly tender collar for more natural outcome.  She is completely in agreement and is willing to go darker.  Mixed completely new colors and she was agreeable with proceeding in the new direction.  She also states that she would like for the nipple to be darker relative to the areola.   11/21/2022 Today, patient states that her areola color from last treatment faded quickly light leaving her with the same peach ring areola from before.  She is now excepting that we need to go even darker with her areolar restoration tattooing.  After discussing with patient, we will focus on both the areola and nipple colors today, defer nipple shading and Montgomery tubercles to a subsequent encounter.  She did say that she was interested in additional detailing with spiral nipple shading.     01/17/2023 Today, patient states that she is increasingly pleased with her  NAC restoration tattooing, but states that the areolas have faded a bit.  She also reports that she has recently started tanning again which also contributes to the tattoos appearing less pronounced.  She is interested in detail work for 3D effect as well as Personal assistant.  DESCRIPTION OF PROCEDURE: After written informed consent was obtained and  proper identification of patient and surgical site was made, the patient was taken to the procedure room and placed supine on the operating room table. A time out was performed to confirm patient's identity and surgical site. The patient was prepped and draped in the usual sterile fashion. Alcohol swabs were used. Attention was turned to the selection of flesh colored permanent tattoo ink to produce the appropriate color for nipple areolar complex tattooing. Using a digital revo 7R tattoo head, pointillism pigment was instilled to the designed nipple areolar complex which was confirmed preoperatively with the patient using the Digital-Pop Deluxe machine by [employer]. Spiral technique was used to fill out the areola.  Attention was then directed towards the nipples and utilization of a lighter pigment for fine detailing.  Similar ink was used for creation of a "halo" effect around the nipples and then for application of the Montgomery tubercles.  Once adequate pigment had been applied to the nipple areolar complex, Xeroform dressing was applied followed by 4 x 4 gauze and secured with Medipore tape. There were no complications and the procedure was tolerated without difficulty.     Areola-color: Fair Honey (2), Cool Honey (2), and Tan Peach (1) in 2:2:1 ratio. Nipple color: Fair Honey (1), Cool Honey (2), and Tan Peach (2) in 1:2:2 ratio.  Did not perform at today's visit. Nipple lightening and halo: Bright Peach (1), Fair Honey (1), and Portrait White (6) in 1:1:6 ratio respectively. Montgomery Tubercles: Bright Peach (2), Fair Honey (2), and Portrait White (8) in 2:2:8 ratio respectively.  Nipple shading: Dark Tan (1), Dark Honey (1), and Tan Honey 4 in 1:1:4 ratio. Did not perform at today's visit.  Consider just cool honey and cool peach if any additional shading required in future. Colors EXP: 10/2024   Nipples are each approximately 4.25 cm diameter at conclusion of visit.   Patient will be  scheduled for telephone visit in 4 weeks to discuss whether or not she would like to proceed with additional tattooing.  She does have an upcoming surgery for fat grafting.  She understands that we will likely hold off on any additional NAC restoration tattooing until she is well-healed from her surgery.   Picture(s) obtained of the patient and placed in the chart were with the patient's or guardian's permission.

## 2023-01-25 MED ORDER — GOSERELIN ACETATE 3.6 MG ~~LOC~~ IMPL
3.6000 mg | DRUG_IMPLANT | SUBCUTANEOUS | Status: DC
  Administered 2023-01-10: 3.6 mg via SUBCUTANEOUS

## 2023-01-25 NOTE — Addendum Note (Signed)
Addended by: Alinda Deem H on: 01/25/2023 10:59 AM   Modules accepted: Orders

## 2023-01-25 NOTE — Addendum Note (Signed)
Addended by: Leo Grosser L on: 01/25/2023 11:02 AM   Modules accepted: Orders

## 2023-01-27 ENCOUNTER — Telehealth: Payer: Self-pay | Admitting: Plastic Surgery

## 2023-01-27 NOTE — Telephone Encounter (Signed)
Pending Ref# is pt [employer] ID.  All clinicals faxed to pre certification intake.

## 2023-01-30 ENCOUNTER — Encounter: Payer: Self-pay | Admitting: Oncology

## 2023-02-06 ENCOUNTER — Inpatient Hospital Stay: Payer: Medicaid Other | Attending: Oncology

## 2023-02-06 DIAGNOSIS — C50811 Malignant neoplasm of overlapping sites of right female breast: Secondary | ICD-10-CM | POA: Insufficient documentation

## 2023-02-06 DIAGNOSIS — Z5111 Encounter for antineoplastic chemotherapy: Secondary | ICD-10-CM | POA: Diagnosis present

## 2023-02-06 DIAGNOSIS — Z170 Estrogen receptor positive status [ER+]: Secondary | ICD-10-CM

## 2023-02-06 MED ORDER — GOSERELIN ACETATE 3.6 MG ~~LOC~~ IMPL
3.6000 mg | DRUG_IMPLANT | SUBCUTANEOUS | Status: DC
  Administered 2023-02-06: 3.6 mg via SUBCUTANEOUS
  Filled 2023-02-06: qty 3.6

## 2023-02-08 ENCOUNTER — Other Ambulatory Visit: Payer: Self-pay | Admitting: Oncology

## 2023-02-09 ENCOUNTER — Other Ambulatory Visit: Payer: Self-pay | Admitting: *Deleted

## 2023-02-09 ENCOUNTER — Encounter: Payer: Self-pay | Admitting: Oncology

## 2023-02-10 ENCOUNTER — Telehealth: Payer: Self-pay | Admitting: Plastic Surgery

## 2023-02-10 ENCOUNTER — Encounter: Payer: Self-pay | Admitting: Oncology

## 2023-02-10 MED ORDER — PREGABALIN 75 MG PO CAPS: 75.0000 mg | ORAL_CAPSULE | Freq: Two times a day (BID) | ORAL | 2 refills | Status: DC

## 2023-02-10 NOTE — Telephone Encounter (Signed)
LVM and my chart message that insurance was cancelled as of 04.01.24 and to please contact our office if she has another insurance policy that she would like to submit preauth to.

## 2023-02-14 ENCOUNTER — Ambulatory Visit (INDEPENDENT_AMBULATORY_CARE_PROVIDER_SITE_OTHER): Payer: Medicaid Other | Admitting: Physician Assistant

## 2023-02-14 DIAGNOSIS — Z9013 Acquired absence of bilateral breasts and nipples: Secondary | ICD-10-CM

## 2023-02-14 NOTE — Progress Notes (Signed)
Patient is a pleasant 42 year old female with PMH of breast cancer s/p bilateral breast reconstruction who has been following with our clinic for nipple areolar tattoo restoration who joins via telephone encounter for check-in.  She was last treated here in clinic 01/17/2023.  At that time, she was pleased but the areolas have faded a bit.  The areolas were filled and there was additional detail work with lightening and Montgomery tubercle placement.    The patient was at work and this provider was calling from their office.  A total of 7 minutes was spent speaking with the patient and reviewing chart.    Today, she tells me that the [facility] tubercles faded and she would be interested in additional detail work with lightening, shading, and tubercle placement.  She states that there has been mild fading of the areolas, but not as much as she had previously seen between sessions.  Discussed returning for additional session, but she states that she would like to abstain until after her upcoming surgery for fat grafting.  She had met with Dr. Ulice Bold mid March to discuss abdominal liposuction and fat grafting to bilateral breasts.  Will reach out to [facility], surgical coordinator, per patient request to provide update.

## 2023-02-28 ENCOUNTER — Encounter: Payer: Self-pay | Admitting: Oncology

## 2023-02-28 ENCOUNTER — Telehealth: Payer: Self-pay | Admitting: Plastic Surgery

## 2023-02-28 NOTE — Telephone Encounter (Signed)
Notified of insurance approval and scheduler will call her to get her scheduled soon.

## 2023-03-06 ENCOUNTER — Inpatient Hospital Stay: Payer: 59

## 2023-03-06 ENCOUNTER — Encounter: Payer: Self-pay | Admitting: Oncology

## 2023-03-06 ENCOUNTER — Inpatient Hospital Stay (HOSPITAL_BASED_OUTPATIENT_CLINIC_OR_DEPARTMENT_OTHER): Payer: 59 | Admitting: Oncology

## 2023-03-06 ENCOUNTER — Inpatient Hospital Stay: Payer: 59 | Attending: Oncology

## 2023-03-06 VITALS — BP 109/73 | HR 90 | Temp 98.2°F | Resp 18 | Ht 67.0 in | Wt 214.1 lb

## 2023-03-06 DIAGNOSIS — C50511 Malignant neoplasm of lower-outer quadrant of right female breast: Secondary | ICD-10-CM | POA: Insufficient documentation

## 2023-03-06 DIAGNOSIS — C50411 Malignant neoplasm of upper-outer quadrant of right female breast: Secondary | ICD-10-CM

## 2023-03-06 DIAGNOSIS — Z5111 Encounter for antineoplastic chemotherapy: Secondary | ICD-10-CM | POA: Insufficient documentation

## 2023-03-06 DIAGNOSIS — Z08 Encounter for follow-up examination after completed treatment for malignant neoplasm: Secondary | ICD-10-CM

## 2023-03-06 DIAGNOSIS — Z79811 Long term (current) use of aromatase inhibitors: Secondary | ICD-10-CM | POA: Diagnosis not present

## 2023-03-06 DIAGNOSIS — Z5181 Encounter for therapeutic drug level monitoring: Secondary | ICD-10-CM

## 2023-03-06 DIAGNOSIS — Z170 Estrogen receptor positive status [ER+]: Secondary | ICD-10-CM | POA: Insufficient documentation

## 2023-03-06 LAB — COMPREHENSIVE METABOLIC PANEL
ALT: 25 U/L (ref 0–44)
AST: 36 U/L (ref 15–41)
Albumin: 4.3 g/dL (ref 3.5–5.0)
Alkaline Phosphatase: 60 U/L (ref 38–126)
Anion gap: 11 (ref 5–15)
BUN: 14 mg/dL (ref 6–20)
CO2: 23 mmol/L (ref 22–32)
Calcium: 9.3 mg/dL (ref 8.9–10.3)
Chloride: 103 mmol/L (ref 98–111)
Creatinine, Ser: 0.99 mg/dL (ref 0.44–1.00)
GFR, Estimated: >60 mL/min (ref >60)
Glucose, Bld: 116 mg/dL — ABNORMAL HIGH (ref 70–99)
Potassium: 3.2 mmol/L — ABNORMAL LOW (ref 3.5–5.1)
Sodium: 137 mmol/L (ref 135–145)
Total Bilirubin: 0.5 mg/dL (ref 0.3–1.2)
Total Protein: 7.1 g/dL (ref 6.5–8.1)

## 2023-03-06 MED ORDER — SODIUM CHLORIDE 0.9 % IV SOLN
INTRAVENOUS | Status: DC
  Filled 2023-03-06: qty 250

## 2023-03-06 MED ORDER — ZOLEDRONIC ACID 4 MG/100ML IV SOLN
4.0000 mg | Freq: Once | INTRAVENOUS | Status: AC
  Administered 2023-03-06: 4 mg via INTRAVENOUS
  Filled 2023-03-06: qty 100

## 2023-03-06 MED ORDER — GOSERELIN ACETATE 3.6 MG ~~LOC~~ IMPL
3.6000 mg | DRUG_IMPLANT | SUBCUTANEOUS | Status: DC
  Administered 2023-03-06: 3.6 mg via SUBCUTANEOUS
  Filled 2023-03-06: qty 3.6

## 2023-03-06 NOTE — Patient Instructions (Signed)
Zoledronic Acid Injection (Cancer) What is this medication? ZOLEDRONIC ACID (ZOE le dron ik AS id) treats high calcium levels in the blood caused by cancer. It may also be used with chemotherapy to treat weakened bones caused by cancer. It works by slowing down the release of calcium from bones. This lowers calcium levels in your blood. It also makes your bones stronger and less likely to break (fracture). It belongs to a group of medications called bisphosphonates. This medicine may be used for other purposes; ask your health care provider or pharmacist if you have questions. COMMON BRAND NAME(S): Zometa, Zometa Powder What should I tell my care team before I take this medication? They need to know if you have any of these conditions: Dehydration Dental disease Kidney disease Liver disease Low levels of calcium in the blood Lung or breathing disease, such as asthma Receiving steroids, such as dexamethasone or prednisone An unusual or allergic reaction to zoledronic acid, other medications, foods, dyes, or preservatives Pregnant or trying to get pregnant Breast-feeding How should I use this medication? This medication is injected into a vein. It is given by your care team in a hospital or clinic setting. Talk to your care team about the use of this medication in children. Special care may be needed. Overdosage: If you think you have taken too much of this medicine contact a poison control center or emergency room at once. NOTE: This medicine is only for you. Do not share this medicine with others. What if I miss a dose? Keep appointments for follow-up doses. It is important not to miss your dose. Call your care team if you are unable to keep an appointment. What may interact with this medication? Certain antibiotics given by injection Diuretics, such as bumetanide, furosemide NSAIDs, medications for pain and inflammation, such as ibuprofen or naproxen Teriparatide Thalidomide This list  may not describe all possible interactions. Give your health care provider a list of all the medicines, herbs, non-prescription drugs, or dietary supplements you use. Also tell them if you smoke, drink alcohol, or use illegal drugs. Some items may interact with your medicine. What should I watch for while using this medication? Visit your care team for regular checks on your progress. It may be some time before you see the benefit from this medication. Some people who take this medication have severe bone, joint, or muscle pain. This medication may also increase your risk for jaw problems or a broken thigh bone. Tell your care team right away if you have severe pain in your jaw, bones, joints, or muscles. Tell you care team if you have any pain that does not go away or that gets worse. Tell your dentist and dental surgeon that you are taking this medication. You should not have major dental surgery while on this medication. See your dentist to have a dental exam and fix any dental problems before starting this medication. Take good care of your teeth while on this medication. Make sure you see your dentist for regular follow-up appointments. You should make sure you get enough calcium and vitamin D while you are taking this medication. Discuss the foods you eat and the vitamins you take with your care team. Check with your care team if you have severe diarrhea, nausea, and vomiting, or if you sweat a lot. The loss of too much body fluid may make it dangerous for you to take this medication. You may need bloodwork while taking this medication. Talk to your care team if  you wish to become pregnant or think you might be pregnant. This medication can cause serious birth defects. What side effects may I notice from receiving this medication? Side effects that you should report to your care team as soon as possible: Allergic reactions--skin rash, itching, hives, swelling of the face, lips, tongue, or  throat Kidney injury--decrease in the amount of urine, swelling of the ankles, hands, or feet Low calcium level--muscle pain or cramps, confusion, tingling, or numbness in the hands or feet Osteonecrosis of the jaw--pain, swelling, or redness in the mouth, numbness of the jaw, poor healing after dental work, unusual discharge from the mouth, visible bones in the mouth Severe bone, joint, or muscle pain Side effects that usually do not require medical attention (report to your care team if they continue or are bothersome): Constipation Fatigue Fever Loss of appetite Nausea Stomach pain This list may not describe all possible side effects. Call your doctor for medical advice about side effects. You may report side effects to FDA at 1-800-FDA-1088. Where should I keep my medication? This medication is given in a hospital or clinic. It will not be stored at home. NOTE: This sheet is a summary. It may not cover all possible information. If you have questions about this medicine, talk to your doctor, pharmacist, or health care provider. Goserelin Implant What is this medication? GOSERELIN (GOE se rel in) treats prostate cancer and breast cancer. It works by decreasing levels of the hormones testosterone and estrogen in the body. This prevents prostate and breast cancer cells from spreading or growing. It may also be used to treat endometriosis. This is a condition where the tissue that lines the uterus grows outside the uterus. It works by decreasing the amount of estrogen your body makes, which reduces heavy bleeding and pain. It can also be used to help thin the lining of the uterus before a surgery used to prevent or reduce heavy periods. This medicine may be used for other purposes; ask your health care provider or pharmacist if you have questions. COMMON BRAND NAME(S): Zoladex, Zoladex 32-Month What should I tell my care team before I take this medication? They need to know if you have any of  these conditions: Bone problems Diabetes Heart disease History of irregular heartbeat or rhythm An unusual or allergic reaction to goserelin, other medications, foods, dyes, or preservatives Pregnant or trying to get pregnant Breastfeeding How should I use this medication? This medication is injected under the skin. It is given by your care team in a hospital or clinic setting. Talk to your care team about the use of this medication in children. Special care may be needed. Overdosage: If you think you have taken too much of this medicine contact a poison control center or emergency room at once. NOTE: This medicine is only for you. Do not share this medicine with others. What if I miss a dose? Keep appointments for follow-up doses. It is important not to miss your dose. Call your care team if you are unable to keep an appointment. What may interact with this medication? Do not take this medication with any of the following: Cisapride Dronedarone Pimozide Thioridazine This medication may also interact with the following: Other medications that cause heart rhythm changes This list may not describe all possible interactions. Give your health care provider a list of all the medicines, herbs, non-prescription drugs, or dietary supplements you use. Also tell them if you smoke, drink alcohol, or use illegal drugs. Some items may  interact with your medicine. What should I watch for while using this medication? Visit your care team for regular checks on your progress. Your symptoms may appear to get worse during the first weeks of this therapy. Tell your care team if your symptoms do not start to get better or if they get worse after this time. Using this medication for a long time may weaken your bones. If you smoke or frequently drink alcohol you may increase your risk of bone loss. A family history of osteoporosis, chronic use of medications for seizures (convulsions), or corticosteroids can also  increase your risk of bone loss. The risk of bone fractures may be increased. Talk to your care team about your bone health. This medication may increase blood sugar. The risk may be higher in patients who already have diabetes. Ask your care team what you can do to lower your risk of diabetes while taking this medication. This medication should stop regular monthly menstruation in women. Tell your care team if you continue to menstruate. Talk to your care team if you wish to become pregnant or think you might be pregnant. This medication can cause serious birth defects if taken during pregnancy or for 12 weeks after stopping treatment. Talk to your care team about reliable forms of contraception. Do not breastfeed while taking this medication. This medication may cause infertility. Talk to your care team if you are concerned about your fertility. What side effects may I notice from receiving this medication? Side effects that you should report to your care team as soon as possible: Allergic reactions--skin rash, itching, hives, swelling of the face, lips, tongue, or throat Change in the amount of urine Heart attack--pain or tightness in the chest, shoulders, arms, or jaw, nausea, shortness of breath, cold or clammy skin, feeling faint or lightheaded Heart rhythm changes--fast or irregular heartbeat, dizziness, feeling faint or lightheaded, chest pain, trouble breathing High blood sugar (hyperglycemia)--increased thirst or amount of urine, unusual weakness or fatigue, blurry vision High calcium level--increased thirst or amount of urine, nausea, vomiting, confusion, unusual weakness or fatigue, bone pain Pain, redness, irritation, or bruising at the injection site Severe back pain, numbness or weakness of the hands, arms, legs, or feet, loss of coordination, loss of bowel or bladder control Stroke--sudden numbness or weakness of the face, arm, or leg, trouble speaking, confusion, trouble walking,  loss of balance or coordination, dizziness, severe headache, change in vision Swelling and pain of the tumor site or lymph nodes Trouble passing urine Side effects that usually do not require medical attention (report to your care team if they continue or are bothersome): Change in sex drive or performance Headache Hot flashes Rapid or extreme change in emotion or mood Sweating Swelling of the ankles, hands, or feet Unusual vaginal discharge, itching, or odor This list may not describe all possible side effects. Call your doctor for medical advice about side effects. You may report side effects to FDA at 1-800-FDA-1088. Where should I keep my medication? This medication is given in a hospital or clinic. It will not be stored at home. NOTE: This sheet is a summary. It may not cover all possible information. If you have questions about this medicine, talk to your doctor, pharmacist, or health care provider.  2023 [employer]vier/Gold Standard (2022-02-16 00:00:00)

## 2023-03-07 ENCOUNTER — Other Ambulatory Visit: Payer: Self-pay | Admitting: *Deleted

## 2023-03-07 ENCOUNTER — Ambulatory Visit (INDEPENDENT_AMBULATORY_CARE_PROVIDER_SITE_OTHER): Payer: 59 | Admitting: Physician Assistant

## 2023-03-07 ENCOUNTER — Encounter: Payer: Self-pay | Admitting: Physician Assistant

## 2023-03-07 VITALS — BP 107/77 | HR 80 | Ht 67.0 in | Wt 213.0 lb

## 2023-03-07 DIAGNOSIS — M79629 Pain in unspecified upper arm: Secondary | ICD-10-CM

## 2023-03-07 DIAGNOSIS — Z170 Estrogen receptor positive status [ER+]: Secondary | ICD-10-CM

## 2023-03-07 DIAGNOSIS — N6489 Other specified disorders of breast: Secondary | ICD-10-CM

## 2023-03-07 MED ORDER — CEPHALEXIN 500 MG PO CAPS: 500.0000 mg | ORAL_CAPSULE | Freq: Four times a day (QID) | ORAL | 0 refills | Status: DC

## 2023-03-07 MED ORDER — OXYCODONE-ACETAMINOPHEN 5-325 MG PO TABS: 1.0000 | ORAL_TABLET | Freq: Four times a day (QID) | ORAL | 0 refills | Status: DC | PRN
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
## 2023-03-08 ENCOUNTER — Other Ambulatory Visit: Payer: Self-pay | Admitting: *Deleted

## 2023-03-08 ENCOUNTER — Encounter: Payer: Self-pay | Admitting: Oncology

## 2023-03-08 DIAGNOSIS — E2839 Other primary ovarian failure: Secondary | ICD-10-CM

## 2023-03-08 DIAGNOSIS — Z170 Estrogen receptor positive status [ER+]: Secondary | ICD-10-CM
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
## 2023-03-10 ENCOUNTER — Encounter: Payer: Self-pay | Admitting: Oncology

## 2023-03-15 ENCOUNTER — Encounter (HOSPITAL_BASED_OUTPATIENT_CLINIC_OR_DEPARTMENT_OTHER): Payer: Self-pay | Admitting: Plastic Surgery

## 2023-03-20 ENCOUNTER — Encounter (HOSPITAL_BASED_OUTPATIENT_CLINIC_OR_DEPARTMENT_OTHER)
Admission: RE | Admit: 2023-03-20 | Discharge: 2023-03-20 | Disposition: A | Discharge status: Home / Self Care | Payer: 59 | Source: Ambulatory Visit | Attending: Plastic Surgery | Admitting: Plastic Surgery

## 2023-03-20 ENCOUNTER — Ambulatory Visit
Admission: RE | Admit: 2023-03-20 | Discharge: 2023-03-20 | Disposition: A | Discharge status: Home / Self Care | Payer: 59 | Source: Ambulatory Visit | Attending: Oncology | Admitting: Oncology

## 2023-03-20 DIAGNOSIS — C50411 Malignant neoplasm of upper-outer quadrant of right female breast: Secondary | ICD-10-CM | POA: Diagnosis present

## 2023-03-20 DIAGNOSIS — Z01818 Encounter for other preprocedural examination: Secondary | ICD-10-CM | POA: Diagnosis present

## 2023-03-20 DIAGNOSIS — Z170 Estrogen receptor positive status [ER+]: Secondary | ICD-10-CM | POA: Insufficient documentation

## 2023-03-20 DIAGNOSIS — M79629 Pain in unspecified upper arm: Secondary | ICD-10-CM

## 2023-03-20 LAB — BASIC METABOLIC PANEL
Anion gap: 9 (ref 5–15)
BUN: 6 mg/dL (ref 6–20)
CO2: 26 mmol/L (ref 22–32)
Calcium: 9.2 mg/dL (ref 8.9–10.3)
Chloride: 103 mmol/L (ref 98–111)
Creatinine, Ser: 0.9 mg/dL (ref 0.44–1.00)
GFR, Estimated: >60 mL/min (ref >60)
Glucose, Bld: 68 mg/dL — ABNORMAL LOW (ref 70–99)
Potassium: 3.3 mmol/L — ABNORMAL LOW (ref 3.5–5.1)
Sodium: 138 mmol/L (ref 135–145)

## 2023-03-22 MED ORDER — GENTAMICIN SULFATE 40 MG/ML IJ SOLN
5.0000 mg/kg | INTRAVENOUS | Status: DC
  Filled 2023-03-22: qty 12.25

## 2023-03-22 MED ORDER — CLINDAMYCIN PHOSPHATE 900 MG/50ML IV SOLN
900.0000 mg | INTRAVENOUS | Status: AC
  Administered 2023-03-23: 900 mg via INTRAVENOUS

## 2023-03-23 ENCOUNTER — Ambulatory Visit (HOSPITAL_BASED_OUTPATIENT_CLINIC_OR_DEPARTMENT_OTHER): Payer: 59 | Admitting: Anesthesiology

## 2023-03-23 ENCOUNTER — Other Ambulatory Visit: Payer: Self-pay

## 2023-03-23 ENCOUNTER — Encounter (HOSPITAL_BASED_OUTPATIENT_CLINIC_OR_DEPARTMENT_OTHER)
Admission: RE | Disposition: A | Discharge status: Home / Self Care | Payer: Self-pay | Source: Home / Self Care | Attending: Plastic Surgery

## 2023-03-23 ENCOUNTER — Encounter (HOSPITAL_BASED_OUTPATIENT_CLINIC_OR_DEPARTMENT_OTHER): Payer: Self-pay | Admitting: Plastic Surgery

## 2023-03-23 ENCOUNTER — Ambulatory Visit (HOSPITAL_BASED_OUTPATIENT_CLINIC_OR_DEPARTMENT_OTHER)
Admission: RE | Admit: 2023-03-23 | Discharge: 2023-03-23 | Disposition: A | Discharge status: Home / Self Care | Payer: 59 | Attending: Plastic Surgery | Admitting: Plastic Surgery

## 2023-03-23 DIAGNOSIS — Z9013 Acquired absence of bilateral breasts and nipples: Secondary | ICD-10-CM | POA: Insufficient documentation

## 2023-03-23 DIAGNOSIS — I10 Essential (primary) hypertension: Secondary | ICD-10-CM | POA: Diagnosis not present

## 2023-03-23 DIAGNOSIS — F418 Other specified anxiety disorders: Secondary | ICD-10-CM

## 2023-03-23 DIAGNOSIS — F17290 Nicotine dependence, other tobacco product, uncomplicated: Secondary | ICD-10-CM | POA: Insufficient documentation

## 2023-03-23 DIAGNOSIS — Z853 Personal history of malignant neoplasm of breast: Secondary | ICD-10-CM | POA: Insufficient documentation

## 2023-03-23 DIAGNOSIS — J45909 Unspecified asthma, uncomplicated: Secondary | ICD-10-CM | POA: Diagnosis not present

## 2023-03-23 DIAGNOSIS — D649 Anemia, unspecified: Secondary | ICD-10-CM | POA: Insufficient documentation

## 2023-03-23 DIAGNOSIS — N651 Disproportion of reconstructed breast: Secondary | ICD-10-CM | POA: Diagnosis not present

## 2023-03-23 DIAGNOSIS — N6489 Other specified disorders of breast: Secondary | ICD-10-CM | POA: Diagnosis present

## 2023-03-23 DIAGNOSIS — D759 Disease of blood and blood-forming organs, unspecified: Secondary | ICD-10-CM | POA: Diagnosis not present

## 2023-03-23 DIAGNOSIS — F32A Depression, unspecified: Secondary | ICD-10-CM | POA: Insufficient documentation

## 2023-03-23 DIAGNOSIS — Z7985 Long-term (current) use of injectable non-insulin antidiabetic drugs: Secondary | ICD-10-CM | POA: Diagnosis not present

## 2023-03-23 DIAGNOSIS — F419 Anxiety disorder, unspecified: Secondary | ICD-10-CM | POA: Diagnosis not present

## 2023-03-23 DIAGNOSIS — Z01818 Encounter for other preprocedural examination: Secondary | ICD-10-CM

## 2023-03-23 HISTORY — PX: LIPOSUCTION WITH LIPOFILLING: SHX6436

## 2023-03-23 SURGERY — REVISION, RECONSTRUCTION, BREAST
Anesthesia: Choice | Site: Breast | Laterality: Bilateral

## 2023-03-23 MED ORDER — FENTANYL CITRATE (PF) 100 MCG/2ML IJ SOLN
INTRAMUSCULAR | Status: AC
  Filled 2023-03-23: qty 2

## 2023-03-23 MED ORDER — MIDAZOLAM HCL 5 MG/5ML IJ SOLN
INTRAMUSCULAR | Status: DC | PRN
  Administered 2023-03-23: 2 mg via INTRAVENOUS

## 2023-03-23 MED ORDER — MIDAZOLAM HCL 2 MG/2ML IJ SOLN
INTRAMUSCULAR | Status: AC
  Filled 2023-03-23: qty 2

## 2023-03-23 MED ORDER — DEXAMETHASONE SODIUM PHOSPHATE 4 MG/ML IJ SOLN
INTRAMUSCULAR | Status: DC | PRN
  Administered 2023-03-23: 5 mg via INTRAVENOUS

## 2023-03-23 MED ORDER — SUCCINYLCHOLINE CHLORIDE 200 MG/10ML IV SOSY
PREFILLED_SYRINGE | INTRAVENOUS | Status: AC
  Filled 2023-03-23: qty 10

## 2023-03-23 MED ORDER — AMISULPRIDE (ANTIEMETIC) 5 MG/2ML IV SOLN
10.0000 mg | Freq: Once | INTRAVENOUS | Status: AC | PRN
  Administered 2023-03-23: 10 mg via INTRAVENOUS

## 2023-03-23 MED ORDER — LIDOCAINE 2% (20 MG/ML) 5 ML SYRINGE
INTRAMUSCULAR | Status: AC
  Filled 2023-03-23: qty 5

## 2023-03-23 MED ORDER — OXYCODONE HCL 5 MG/5ML PO SOLN: 5.0000 mg | Freq: Once | ORAL | Status: AC | PRN

## 2023-03-23 MED ORDER — PROMETHAZINE HCL 25 MG/ML IJ SOLN: 6.2500 mg | INTRAMUSCULAR | Status: DC | PRN

## 2023-03-23 MED ORDER — FENTANYL CITRATE (PF) 100 MCG/2ML IJ SOLN
25.0000 ug | INTRAMUSCULAR | Status: DC | PRN
  Administered 2023-03-23: 50 ug via INTRAVENOUS

## 2023-03-23 MED ORDER — EPHEDRINE SULFATE (PRESSORS) 50 MG/ML IJ SOLN
INTRAMUSCULAR | Status: DC | PRN
  Administered 2023-03-23: 10 mg via INTRAVENOUS

## 2023-03-23 MED ORDER — SODIUM CHLORIDE 0.9% FLUSH: 3.0000 mL | INTRAVENOUS | Status: DC | PRN

## 2023-03-23 MED ORDER — HYDROMORPHONE HCL 1 MG/ML IJ SOLN
INTRAMUSCULAR | Status: AC
  Filled 2023-03-23: qty 0.5

## 2023-03-23 MED ORDER — ONDANSETRON HCL 4 MG/2ML IJ SOLN
INTRAMUSCULAR | Status: DC | PRN
  Administered 2023-03-23: 4 mg via INTRAVENOUS

## 2023-03-23 MED ORDER — MEPERIDINE HCL 25 MG/ML IJ SOLN: 6.2500 mg | INTRAMUSCULAR | Status: DC | PRN

## 2023-03-23 MED ORDER — ACETAMINOPHEN 325 MG RE SUPP: 650.0000 mg | RECTAL | Status: DC | PRN

## 2023-03-23 MED ORDER — ACETAMINOPHEN 325 MG PO TABS: 650.0000 mg | ORAL_TABLET | ORAL | Status: DC | PRN

## 2023-03-23 MED ORDER — BUPIVACAINE LIPOSOME 1.3 % IJ SUSP
INTRAMUSCULAR | Status: AC
  Filled 2023-03-23: qty 40

## 2023-03-23 MED ORDER — CLINDAMYCIN PHOSPHATE 900 MG/50ML IV SOLN
INTRAVENOUS | Status: AC
  Filled 2023-03-23: qty 50

## 2023-03-23 MED ORDER — OXYCODONE HCL 5 MG PO TABS
5.0000 mg | ORAL_TABLET | Freq: Once | ORAL | Status: AC | PRN
  Administered 2023-03-23: 5 mg via ORAL

## 2023-03-23 MED ORDER — PHENYLEPHRINE 80 MCG/ML (10ML) SYRINGE FOR IV PUSH (FOR BLOOD PRESSURE SUPPORT)
PREFILLED_SYRINGE | INTRAVENOUS | Status: AC
  Filled 2023-03-23: qty 10

## 2023-03-23 MED ORDER — FENTANYL CITRATE (PF) 100 MCG/2ML IJ SOLN
INTRAMUSCULAR | Status: DC | PRN
  Administered 2023-03-23 (×2): 50 ug via INTRAVENOUS

## 2023-03-23 MED ORDER — LIDOCAINE HCL 1 % IJ SOLN
INTRAVENOUS | Status: DC | PRN
  Administered 2023-03-23: 525 mL

## 2023-03-23 MED ORDER — AMISULPRIDE (ANTIEMETIC) 5 MG/2ML IV SOLN
INTRAVENOUS | Status: AC
  Filled 2023-03-23: qty 4

## 2023-03-23 MED ORDER — ONDANSETRON HCL 4 MG/2ML IJ SOLN
INTRAMUSCULAR | Status: AC
  Filled 2023-03-23: qty 2

## 2023-03-23 MED ORDER — LACTATED RINGERS IV SOLN: INTRAVENOUS | Status: DC

## 2023-03-23 MED ORDER — EPHEDRINE 5 MG/ML INJ
INTRAVENOUS | Status: AC
  Filled 2023-03-23: qty 5

## 2023-03-23 MED ORDER — SODIUM CHLORIDE 0.9 % IV SOLN
INTRAVENOUS | Status: DC | PRN
  Administered 2023-03-23: 240 mg via INTRAVENOUS

## 2023-03-23 MED ORDER — CHLORHEXIDINE GLUCONATE CLOTH 2 % EX PADS: 6.0000 | MEDICATED_PAD | Freq: Once | CUTANEOUS | Status: DC

## 2023-03-23 MED ORDER — LIDOCAINE-EPINEPHRINE 1 %-1:100000 IJ SOLN
INTRAMUSCULAR | Status: DC | PRN
  Administered 2023-03-23: 10 mL via INTRAMUSCULAR

## 2023-03-23 MED ORDER — ATROPINE SULFATE 0.4 MG/ML IV SOLN
INTRAVENOUS | Status: AC
  Filled 2023-03-23: qty 1

## 2023-03-23 MED ORDER — HYDROMORPHONE HCL 1 MG/ML IJ SOLN
0.2500 mg | INTRAMUSCULAR | Status: DC | PRN
  Administered 2023-03-23: 0.5 mg via INTRAVENOUS

## 2023-03-23 MED ORDER — SODIUM CHLORIDE 0.9 % IV SOLN: 250.0000 mL | INTRAVENOUS | Status: DC | PRN

## 2023-03-23 MED ORDER — DEXAMETHASONE SODIUM PHOSPHATE 10 MG/ML IJ SOLN
INTRAMUSCULAR | Status: AC
  Filled 2023-03-23: qty 1

## 2023-03-23 MED ORDER — SODIUM CHLORIDE 0.9% FLUSH: 3.0000 mL | Freq: Two times a day (BID) | INTRAVENOUS | Status: DC

## 2023-03-23 MED ORDER — OXYCODONE HCL 5 MG PO TABS: 5.0000 mg | ORAL_TABLET | ORAL | Status: DC | PRN

## 2023-03-23 MED ORDER — PROPOFOL 10 MG/ML IV BOLUS
INTRAVENOUS | Status: DC | PRN
  Administered 2023-03-23: 200 mg via INTRAVENOUS

## 2023-03-23 MED ORDER — OXYCODONE HCL 5 MG PO TABS
ORAL_TABLET | ORAL | Status: AC
  Filled 2023-03-23: qty 1

## 2023-03-23 MED ORDER — LIDOCAINE 2% (20 MG/ML) 5 ML SYRINGE
INTRAMUSCULAR | Status: DC | PRN
  Administered 2023-03-23: 80 mg via INTRAVENOUS

## 2023-03-23 SURGICAL SUPPLY — 44 items
ADH SKN CLS APL DERMABOND .7 (GAUZE/BANDAGES/DRESSINGS) ×1
BINDER ABDOMINAL 10 UNV 27-48 (MISCELLANEOUS) IMPLANT
BINDER ABDOMINAL 12 SM 30-45 (SOFTGOODS) IMPLANT
BINDER ABDOMINAL 9 SM 30-45 (SOFTGOODS) IMPLANT
BINDER BREAST LRG (GAUZE/BANDAGES/DRESSINGS) IMPLANT
BINDER BREAST MEDIUM (GAUZE/BANDAGES/DRESSINGS) IMPLANT
BINDER BREAST XLRG (GAUZE/BANDAGES/DRESSINGS) IMPLANT
BINDER BREAST XXLRG (GAUZE/BANDAGES/DRESSINGS) IMPLANT
BLADE HEX COATED 2.75 (ELECTRODE) IMPLANT
BLADE SURG 15 STRL LF DISP TIS (BLADE) ×1 IMPLANT
BLADE SURG 15 STRL SS (BLADE) ×1
COVER BACK TABLE 60X90IN (DRAPES) ×1 IMPLANT
COVER MAYO STAND STRL (DRAPES) ×1 IMPLANT
DERMABOND ADVANCED .7 DNX12 (GAUZE/BANDAGES/DRESSINGS) ×1 IMPLANT
DRAPE LAPAROSCOPIC ABDOMINAL (DRAPES) ×1 IMPLANT
ELECT REM PT RETURN 9FT ADLT (ELECTROSURGICAL) ×1
ELECTRODE REM PT RTRN 9FT ADLT (ELECTROSURGICAL) ×1 IMPLANT
EXTRACTOR CANIST REVOLVE STRL (CANNISTER) ×1 IMPLANT
GAUZE PAD ABD 8X10 STRL (GAUZE/BANDAGES/DRESSINGS) ×2 IMPLANT
GLOVE SURG SS PI 6.5 STRL IVOR (GLOVE) ×3 IMPLANT
GOWN STRL REUS W/ TWL LRG LVL3 (GOWN DISPOSABLE) ×2 IMPLANT
GOWN STRL REUS W/TWL LRG LVL3 (GOWN DISPOSABLE) ×2
IV LACTATED RINGERS 1000ML (IV SOLUTION) ×2 IMPLANT
LINER CANISTER 1000CC FLEX (MISCELLANEOUS) ×1 IMPLANT
NDL HYPO 25X1 1.5 SAFETY (NEEDLE) ×1 IMPLANT
NEEDLE HYPO 25X1 1.5 SAFETY (NEEDLE) ×1 IMPLANT
PACK BASIN DAY SURGERY FS (CUSTOM PROCEDURE TRAY) ×1 IMPLANT
PAD ALCOHOL SWAB (MISCELLANEOUS) ×1 IMPLANT
PENCIL SMOKE EVACUATOR (MISCELLANEOUS) IMPLANT
SLEEVE SCD COMPRESS KNEE MED (STOCKING) ×1 IMPLANT
SPIKE FLUID TRANSFER (MISCELLANEOUS) IMPLANT
SPONGE T-LAP 18X18 ~~LOC~~+RFID (SPONGE) ×1 IMPLANT
SUT MNCRL AB 4-0 PS2 18 (SUTURE) IMPLANT
SUT MON AB 5-0 PS2 18 (SUTURE) ×2 IMPLANT
SYR 10ML LL (SYRINGE) ×5 IMPLANT
SYR 3ML 18GX1 1/2 (SYRINGE) IMPLANT
SYR 50ML LL SCALE MARK (SYRINGE) ×1 IMPLANT
SYR CONTROL 10ML LL (SYRINGE) ×1 IMPLANT
SYR TOOMEY 50ML (SYRINGE) IMPLANT
TOWEL GREEN STERILE FF (TOWEL DISPOSABLE) ×2 IMPLANT
TRAY DSU PREP LF (CUSTOM PROCEDURE TRAY) ×1 IMPLANT
TUBING INFILTRATION IT-10001 (TUBING) ×1 IMPLANT
TUBING SET GRADUATE ASPIR 12FT (MISCELLANEOUS) ×1 IMPLANT
UNDERPAD 30X36 HEAVY ABSORB (UNDERPADS AND DIAPERS) ×2 IMPLANT

## 2023-03-23 NOTE — Op Note (Signed)
DATE OF OPERATION: 03/23/2023  LOCATION: [facility]  PREOPERATIVE DIAGNOSIS: Breast asymmetry  POSTOPERATIVE DIAGNOSIS: Same  PROCEDURE:   SURGEON: Cameshia Cressman Sanger Micayla Brathwaite, DO  ASSISTANT: Evelena Leyden, PA  EBL: none  CONDITION: Stable  COMPLICATIONS: None  INDICATION: The patient, [name], is a 42 y.o. female born on [birth year], is here for treatment of breast asymmetry.   PROCEDURE DETAILS:  The patient was seen prior to surgery and marked.  The IV antibiotics were given. The patient was taken to the operating room and given a general anesthetic. A standard time out was performed and all information was confirmed by those in the room. SCDs were placed.   The abdomen and breast were prepped with betadine.  The local was injected at the incision site.  Two incisions were made at the lower lateral abdomen.  The breast incisions were in the previous incision sites medially.  The tumescent was placed in the abdomen.  Liposuction was done and the adipose collected and was as directed in the [facility].  The adipose was prepared and injected into the beasts at the medial superior aspect.  A total of 70 cc of fat was placed on the left and 30 cc on the right breast. The incisions were closed with the 5-0 Monocryl. The patient was allowed to wake up and taken to recovery room in stable condition at the end of the case. The family was notified at the end of the case. The fat was difficult to harvest likely from scar tissue.  The advanced practice practitioner (APP) assisted throughout the case.  The APP was essential in retraction and counter traction when needed to make the case progress smoothly.  This retraction and assistance made it possible to see the tissue plans for the procedure.  The assistance was needed for blood control, tissue re-approximation and assisted with closure of the incision site.

## 2023-03-23 NOTE — Progress Notes (Signed)
Patient is a pleasant 42 year old female with PMH of breast reconstruction who is now s/p abdominal liposuction and bilateral breast fat grafting performed 03/23/2023 by Dr. Ulice Bold who joins via telephone for postoperative day 1 check-in.  The patient was at home and this provider was calling from their office.  A total of 8 minutes was spent speaking with the patient and reviewing chart.    Today, patient is doing okay.  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  She is sore in the abdomen and states that that she did have some leakage from one of her liposuction sites, but that has since largely improved.  She has not taken Tylenol yet today and denies any severe pain.  She is tolerating p.o. intake without difficulty and voiding, no BM yet.  Discussed increased ambulation and hydration.  She has not had any fevers, chest pain, difficulty breathing, or other symptoms.  Plan for her to follow-up next week, as scheduled.  She will certainly call the clinic should she have questions or concerns in the interim.

## 2023-03-23 NOTE — Discharge Instructions (Addendum)
INSTRUCTIONS FOR AFTER BREAST SURGERY   You will likely have some questions about what to expect following your operation.  The following information will help you and your family understand what to expect when you are discharged from the hospital.  It is important to follow these guidelines to help ensure a smooth recovery and reduce complication.  Postoperative instructions include information on: diet, wound care, medications and physical activity.  AFTER SURGERY Expect to go home after the procedure.  In some cases, you may need to spend one night in the hospital for observation.  DIET Breast surgery does not require a specific diet.  However, the healthier you eat the better your body will heal. It is important to increasing your protein intake.  This means limiting the foods with sugar and carbohydrates.  Focus on vegetables and some meat.  If you have liposuction during your procedure be sure to drink water.  If your urine is bright yellow, then it is concentrated, and you need to drink more water.  As a general rule after surgery, you should have 8 ounces of water every hour while awake.  If you find you are persistently nauseated or unable to take in liquids let us know.  NO TOBACCO USE or EXPOSURE.  This will slow your healing process and lead to a wound.  WOUND CARE Leave the binder on for 3 days . Use fragrance free soap like Dial, Dove or Ivory.   After 3 days you can remove the binder to shower. Once dry apply binder or sports bra. If you have liposuction you will have a soft and spongy dressing (Lipofoam) that helps prevent creases in your skin.  Remove before you shower and then replace it.  It is also available on [employer]. If you have steri-strips / tape directly attached to your skin leave them in place. It is OK to get these wet.   No baths, pools or hot tubs for four weeks. We close your incision to leave the smallest and best-looking scar. No ointment or creams on your incisions  for four weeks.  No Neosporin (Too many skin reactions).  A few weeks after surgery you can use Mederma and start massaging the scar. We ask you to wear your binder or sports bra for the first 6 weeks around the clock, including while sleeping. This provides added comfort and helps reduce the fluid accumulation at the surgery site. NO Ice or heating pads to the operative site.  You have a very high risk of a BURN before you feel the temperature change.  ACTIVITY No heavy lifting until cleared by the doctor.  This usually means no more than a half-gallon of milk.  It is OK to walk and climb stairs. Moving your legs is very important to decrease your risk of a blood clot.  It will also help keep you from getting deconditioned.  Every 1 to 2 hours get up and walk for 5 minutes. This will help with a quicker recovery back to normal.  Let pain be your guide so you don't do too much.  This time is for you to recover.  You will be more comfortable if you sleep and rest with your head elevated either with a few pillows under you or in a recliner.  No stomach sleeping for a three months.  WORK Everyone returns to work at different times. As a rough guide, most people take at least 1 - 2 weeks off prior to returning to work. If   you need documentation for your job, give the forms to the front staff at the clinic.  DRIVING Arrange for someone to bring you home from the hospital after your surgery.  You may be able to drive a few days after surgery but not while taking any narcotics or valium.  BOWEL MOVEMENTS Constipation can occur after anesthesia and while taking pain medication.  It is important to stay ahead for your comfort.  We recommend taking Milk of Magnesia (2 tablespoons; twice a day) while taking the pain pills.  MEDICATIONS You may be prescribed should start after surgery At your preoperative visit for you history and physical you may have been given the following medications: An antibiotic: Start  this medication when you get home and take according to the instructions on the bottle. Zofran 4 mg:  This is to treat nausea and vomiting.  You can take this every 6 hours as needed and only if needed. Valium 2 mg for breast cancer patients: This is for muscle tightness if you have an implant or expander. This will help relax your muscle which also helps with pain control.  This can be taken every 12 hours as needed. Don't drive after taking this medication. Norco (hydrocodone/acetaminophen) 5/325 mg:  This is only to be used after you have taken the Motrin or the Tylenol. Every 8 hours as needed.   Over the counter Medication to take: Ibuprofen (Motrin) 600 mg:  Take this every 6 hours.  If you have additional pain then take 500 mg of the Tylenol every 8 hours.  Only take the Norco after you have tried these two. MiraLAX or Milk of Magnesia: Take this according to the bottle if you take the Norco.  WHEN TO CALL Call your surgeon's office if any of the following occur: Fever 101 degrees F or greater Excessive bleeding or fluid from the incision site. Pain that increases over time without aid from the medications Redness, warmth, or pus draining from incision sites Persistent nausea or inability to take in liquids Severe misshapen area that underwent the operation.  Post Anesthesia Home Care Instructions  Activity: Get plenty of rest for the remainder of the day. A responsible individual must stay with you for 24 hours following the procedure.  For the next 24 hours, DO NOT: -Drive a car -Operate machinery -Drink alcoholic beverages -Take any medication unless instructed by your physician -Make any legal decisions or sign important papers.  Meals: Start with liquid foods such as gelatin or soup. Progress to regular foods as tolerated. Avoid greasy, spicy, heavy foods. If nausea and/or vomiting occur, drink only clear liquids until the nausea and/or vomiting subsides. Call your  physician if vomiting continues.  Special Instructions/Symptoms: Your throat may feel dry or sore from the anesthesia or the breathing tube placed in your throat during surgery. If this causes discomfort, gargle with warm salt water. The discomfort should disappear within 24 hours.  If you had a scopolamine patch placed behind your ear for the management of post- operative nausea and/or vomiting:  1. The medication in the patch is effective for 72 hours, after which it should be removed.  Wrap patch in a tissue and discard in the trash. Wash hands thoroughly with soap and water. 2. You may remove the patch earlier than 72 hours if you experience unpleasant side effects which may include dry mouth, dizziness or visual disturbances. 3. Avoid touching the patch. Wash your hands with soap and water after contact with the patch.     

## 2023-03-23 NOTE — Interval H&P Note (Signed)
History and Physical Interval Note:  03/23/2023 7:08 AM  [name]  has presented today for surgery, with the diagnosis of bsence of breast, acquired, bilateral.  The various methods of treatment have been discussed with the patient and family. After consideration of risks, benefits and other options for treatment, the patient has consented to  Procedure(s): bilateral lipofilling of breasts (Bilateral) LIPOSUCTION WITH LIPOFILLING (Bilateral) as a surgical intervention.  The patient's history has been reviewed, patient examined, no change in status, stable for surgery.  I have reviewed the patient's chart and labs.  Questions were answered to the patient's satisfaction.     Alena Bills Hau Sanor

## 2023-03-23 NOTE — Anesthesia Postprocedure Evaluation (Signed)
Anesthesia Post Note  Patient: [name]  Procedure(s) Performed: bilateral lipofilling of breasts (Bilateral: Breast) LIPOSUCTION WITH LIPOFILLING (Bilateral: Breast)     Patient location during evaluation: PACU Anesthesia Type: Combined General/Spinal Level of consciousness: awake and alert Pain management: pain level controlled Vital Signs Assessment: post-procedure vital signs reviewed and stable Respiratory status: spontaneous breathing, nonlabored ventilation and respiratory function stable Cardiovascular status: blood pressure returned to baseline and stable Postop Assessment: no apparent nausea or vomiting Anesthetic complications: no   No notable events documented.  Last Vitals:  Vitals:   03/23/23 1022 03/23/23 1102  BP: 113/69 119/76  Pulse: 75 84  Resp: 19 16  Temp:  36.4 C  SpO2: 95% 93%    Last Pain:  Vitals:   03/23/23 1102  TempSrc:   PainSc: 4                  Lowella Curb

## 2023-03-23 NOTE — Anesthesia Procedure Notes (Signed)
Date/Time: 03/23/2023 8:39 AM  Performed by: Ronnette Hila, CRNALMA: LMA inserted LMA Size: 4.0

## 2023-03-23 NOTE — Transfer of Care (Signed)
Immediate Anesthesia Transfer of Care Note  Patient: Mona L Chaves  Procedure(s) Performed: bilateral lipofilling of breasts (Bilateral: Breast) LIPOSUCTION WITH LIPOFILLING (Bilateral: Breast)  Patient Location: PACU  Anesthesia Type:General  Level of Consciousness: sedated  Airway & Oxygen Therapy: Patient Spontanous Breathing and Patient connected to face mask oxygen  Post-op Assessment: Report given to RN and Post -op Vital signs reviewed and stable  Post vital signs: Reviewed and stable  Last Vitals:  Vitals Value Taken Time  BP    Temp    Pulse 86 03/23/23 0948  Resp 17 03/23/23 0948  SpO2 99 % 03/23/23 0948  Vitals shown include unvalidated device data.  Last Pain:  Vitals:   03/23/23 0706  TempSrc: Temporal  PainSc: 0-No pain      Patients Stated Pain Goal: 4 (03/23/23 0706)  Complications: No notable events documented.
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
## 2023-03-24 ENCOUNTER — Ambulatory Visit (INDEPENDENT_AMBULATORY_CARE_PROVIDER_SITE_OTHER): Payer: 59 | Admitting: Physician Assistant

## 2023-03-24 DIAGNOSIS — Z9013 Acquired absence of bilateral breasts and nipples: Secondary | ICD-10-CM

## 2023-03-24 DIAGNOSIS — Z9889 Other specified postprocedural states: Secondary | ICD-10-CM

## 2023-03-25 ENCOUNTER — Encounter (HOSPITAL_BASED_OUTPATIENT_CLINIC_OR_DEPARTMENT_OTHER): Payer: Self-pay | Admitting: Plastic Surgery

## 2023-03-29 ENCOUNTER — Ambulatory Visit: Payer: 59

## 2023-03-30 ENCOUNTER — Ambulatory Visit (INDEPENDENT_AMBULATORY_CARE_PROVIDER_SITE_OTHER): Payer: 59 | Admitting: Physician Assistant

## 2023-03-30 DIAGNOSIS — Z9013 Acquired absence of bilateral breasts and nipples: Secondary | ICD-10-CM

## 2023-03-30 DIAGNOSIS — Z9889 Other specified postprocedural states: Secondary | ICD-10-CM

## 2023-03-30 NOTE — Progress Notes (Signed)
Patient is a pleasant 42 year old female with PMH of breast reconstruction who is now s/p abdominal liposuction and bilateral breast fat grafting performed 03/23/2023 by Dr. Ulice Bold who presents to clinic for postoperative follow-up.  Reviewed operative report and a total of 70 cc fat was grafted to the left breast and 30 cc grafted to the right breast.  Today, patient is doing okay.  She states that she has had considerably more discomfort with this surgery compared to her previous abdominal liposuction with fat grafting surgery.  Discussed with patient this is not entirely surprising given that it took quite a while for Dr. Ulice Bold to harvest the fat that she did and that it becomes incrementally more difficult with each subsequent harvest.  She denies any leg swelling, chest pain, difficulty breathing, fevers, or other symptoms.  On exam, abdomen is soft, no significant tenderness or rebound.  No rigidity.  Old and improving ecchymoses in the suprapubic and lower abdominal regions.  Some additional ecchymoses out towards flanks on each side.  No palpable fluid collections or obvious seroma/hematoma.  Slightly improved contour of the breasts bilaterally.  All of the incisions appear to be healing well, sutures remain intact.  Residual Dermabond over the small incisions on the breasts.  Recommending Vaseline to her fat graft placement incision sites on the breast to help with residual Dermabond.  Emphasized the importance of continued compression to the abdomen daily as well as activity modifications.  She is in no distress and her abdominal exam is without evidence concerning for peritonitis or hematoma.  She tells me that she is not interested in pursuing any additional fat grafting surgery for contour.  Return in 2 weeks, as scheduled.  She understands that she can certainly call the clinic should she have questions or concerns in the interim.

## 2023-03-31 ENCOUNTER — Encounter: Payer: 59 | Admitting: Physician Assistant

## 2023-04-06 ENCOUNTER — Inpatient Hospital Stay: Payer: 59 | Attending: Oncology

## 2023-04-06 DIAGNOSIS — Z170 Estrogen receptor positive status [ER+]: Secondary | ICD-10-CM | POA: Diagnosis not present

## 2023-04-06 DIAGNOSIS — C50511 Malignant neoplasm of lower-outer quadrant of right female breast: Secondary | ICD-10-CM | POA: Insufficient documentation

## 2023-04-06 DIAGNOSIS — Z5111 Encounter for antineoplastic chemotherapy: Secondary | ICD-10-CM | POA: Diagnosis present

## 2023-04-06 MED ORDER — LEUPROLIDE ACETATE 3.75 MG IM KIT
11.2500 mg | PACK | INTRAMUSCULAR | Status: DC
  Administered 2023-04-06: 11.25 mg via INTRAMUSCULAR
  Filled 2023-04-06: qty 11.25

## 2023-04-11 ENCOUNTER — Encounter: Payer: Self-pay | Admitting: Obstetrics and Gynecology

## 2023-04-11 ENCOUNTER — Ambulatory Visit (INDEPENDENT_AMBULATORY_CARE_PROVIDER_SITE_OTHER): Payer: 59 | Admitting: Obstetrics and Gynecology

## 2023-04-11 VITALS — BP 114/83 | HR 99 | Ht 67.0 in | Wt 206.9 lb

## 2023-04-11 DIAGNOSIS — Z7183 Encounter for nonprocreative genetic counseling: Secondary | ICD-10-CM

## 2023-04-11 DIAGNOSIS — Z853 Personal history of malignant neoplasm of breast: Secondary | ICD-10-CM

## 2023-04-11 DIAGNOSIS — Z7689 Persons encountering health services in other specified circumstances: Secondary | ICD-10-CM

## 2023-04-11 NOTE — Progress Notes (Signed)
Patient presents today to discuss possible oophorectomy. She states a history of breast cancer, double mastectomy and a further history of a hysterectomy with right oophorectomy.

## 2023-04-11 NOTE — Progress Notes (Signed)
HPI:      [name] is a 41 y.o. G4P0 who LMP was Patient's last menstrual period was 05/06/2010 (within years).  Subjective:   She presents today to discuss oophorectomy.  She has previously had hysterectomy and right oophorectomy for severe endometriosis (age 30).  She was diagnosed 3 years ago with breast cancer and has had bilateral mastectomy performed.  She is currently on Lupron and Arimidex.  She would like to have her left ovary removed so she can eliminate the Lupron. Her prior surgeries on her abdomen include cholecystectomy, laparoscopic hysterectomy and right oophorectomy, 2 prior cesarean deliveries, and an abdominal procedure that removes abdominal fat and uses it in the breast.    Hx: The following portions of the patient's history were reviewed and updated as appropriate:             She  has a past medical history of Anemia, Anxiety, Asthma, Atrophic pancreas, Breast cancer (HCC) (04/2020), Chronic back pain, Depression, Family history of bladder cancer, Family history of breast cancer, Family history of colon cancer, GERD (gastroesophageal reflux disease) (04/2020), History of kidney stones, Hypertension, Incisional hernia (08/2022), Insomnia, Seizure (HCC) (1999), and Umbilical hernia (08/2022). She does not have any pertinent problems on file. She  has a past surgical history that includes Appendectomy; Cesarean section; Abdominal hysterectomy; Dilation and curettage of uterus; Cholecystectomy; Wisdom tooth extraction; Finger surgery (Left, 2003); Partial mastectomy with needle localization and axillary sentinel lymph node bx (Right, 05/07/2020); Breast biopsy (Right, 04/02/2020); Re-excision of breast lumpectomy (Right, 05/28/2020); Portacath placement (N/A, 05/28/2020); Total mastectomy (Bilateral, 09/14/2020); Port-a-cath removal (09/14/2020); Breast reconstruction with placement of tissue expander and flex hd (acellular hydrated dermis) (Bilateral, 09/14/2020);  Removal of bilateral tissue expanders with placement of bilateral breast implants (Bilateral, 01/06/2021); Liposuction with lipofilling (Bilateral, 07/08/2021); Cyst excision (Right, 07/08/2021); Liposuction with lipofilling (Bilateral, 02/16/2022); XI robotic assisted ventral hernia (N/A, 09/02/2022); Cyst excision (N/A, 09/02/2022); Umbilical hernia repair (N/A, 09/02/2022); Insertion of mesh (09/02/2022); and Liposuction with lipofilling (Bilateral, 03/23/2023). Her family history includes Anxiety disorder in her mother; Bladder Cancer in her paternal aunt; Breast cancer in her maternal great-grandmother; Breast cancer (age of onset: 71) in her maternal aunt; CAD in her father; Colon cancer in her maternal grandfather and maternal uncle; Irritable bowel syndrome in her mother; Non-Hodgkin's lymphoma (age of onset: 68) in her maternal grandmother. She  reports that she has never smoked. She has never used smokeless tobacco. She reports current alcohol use. She reports that she does not use drugs. She has a current medication list which includes the following prescription(s): albuterol, alprazolam, b-d uf iii mini pen needles, biotin w/ vitamins c & e, bupropion, celecoxib, chlorhexidine, diclofenac sodium, dicyclomine, epinephrine, exemestane, fluticasone, goserelin, hydrochlorothiazide, methocarbamol, multivitamin with minerals, ondansetron, ozempic (0.25 or 0.5 mg/dose), pregabalin, prochlorperazine, promethazine, sertraline, sodium fluoride 5000 ppm, sumatriptan, trazodone, and zoledronic acid, and the following Facility-Administered Medications: leuprolide and leuprolide. She is allergic to bee venom, fish allergy, fish-derived products, latex, penicillins, shellfish allergy, adhesive [tape], ciprofloxacin, and tapentadol.       Review of Systems:  Review of Systems  Constitutional: Denied constitutional symptoms, night sweats, recent illness, fatigue, fever, insomnia and weight loss.  Eyes: Denied eye  symptoms, eye pain, photophobia, vision change and visual disturbance.  Ears/Nose/Throat/Neck: Denied ear, nose, throat or neck symptoms, hearing loss, nasal discharge, sinus congestion and sore throat.  Cardiovascular: Denied cardiovascular symptoms, arrhythmia, chest pain/pressure, edema, exercise intolerance, orthopnea and palpitations.  Respiratory: Denied pulmonary symptoms, asthma, pleuritic  pain, productive sputum, cough, dyspnea and wheezing.  Gastrointestinal: Denied, gastro-esophageal reflux, melena, nausea and vomiting.  Genitourinary: Denied genitourinary symptoms including symptomatic vaginal discharge, pelvic relaxation issues, and urinary complaints.  Musculoskeletal: Denied musculoskeletal symptoms, stiffness, swelling, muscle weakness and myalgia.  Dermatologic: Denied dermatology symptoms, rash and scar.  Neurologic: Denied neurology symptoms, dizziness, headache, neck pain and syncope.  Psychiatric: Denied psychiatric symptoms, anxiety and depression.  Endocrine: Denied endocrine symptoms including hot flashes and night sweats.   Meds:   Current Outpatient Medications on File Prior to Visit  Medication Sig Dispense Refill   albuterol (VENTOLIN HFA) 108 (90 Base) MCG/ACT inhaler Inhale 2 puffs into the lungs every 6 (six) hours as needed.     ALPRAZolam (XANAX) 0.5 MG tablet Take 1 tablet (0.5 mg total) by mouth 3 (three) times daily as needed for anxiety. 60 tablet 0   B-D UF III MINI PEN NEEDLES 31G X 5 MM MISC Inject into the skin daily.     Biotin w/ Vitamins C & E (HAIR/SKIN/NAILS PO) Take 1 tablet by mouth daily.     buPROPion (WELLBUTRIN XL) 150 MG 24 hr tablet Take 150 mg by mouth daily.     celecoxib (CELEBREX) 100 MG capsule Take 100 mg by mouth 2 (two) times daily.     chlorhexidine (PERIDEX) 0.12 % solution Use as directed 10 mLs in the mouth or throat as needed.     diclofenac Sodium (VOLTAREN) 1 % GEL Apply 4 g topically 4 (four) times daily.     dicyclomine  (BENTYL) 10 MG capsule Take 1 capsule (10 mg total) by mouth 4 (four) times daily -  before meals and at bedtime. 40 capsule 0   EPINEPHrine 0.3 mg/0.3 mL IJ SOAJ injection Inject 0.3 mg into the muscle once as needed.     exemestane (AROMASIN) 25 MG tablet Take 1 tablet (25 mg total) by mouth at bedtime.     fluticasone (FLONASE) 50 MCG/ACT nasal spray Place 2 sprays into both nostrils as needed.     goserelin (ZOLADEX) 3.6 MG injection Inject 3.6 mg into the skin.     hydrochlorothiazide (HYDRODIURIL) 25 MG tablet Take 25 mg by mouth daily.     methocarbamol (ROBAXIN) 750 MG tablet Take 750 mg by mouth in the morning and at bedtime.     Multiple Vitamins-Minerals (MULTIVITAMIN WITH MINERALS) tablet Take 1 tablet by mouth at bedtime.     ondansetron (ZOFRAN-ODT) 4 MG disintegrating tablet Take 1 tablet (4 mg total) by mouth every 8 (eight) hours as needed. 20 tablet 0   OZEMPIC, 0.25 OR 0.5 MG/DOSE, 2 MG/3ML SOPN SMARTSIG:0.25 Milligram(s) SUB-Q Once a Week     pregabalin (LYRICA) 75 MG capsule Take 1 capsule (75 mg total) by mouth 2 (two) times daily. 1 pill(75 mg morning) and  and 2 pills (150mg  QHS) 90 capsule 2   prochlorperazine (COMPAZINE) 10 MG tablet Take 1 tablet (10 mg total) by mouth every 6 (six) hours as needed (Nausea or vomiting). 30 tablet 1   promethazine (PHENERGAN) 25 MG tablet Take 0.5-1 tablets (12.5-25 mg total) by mouth every 6 (six) hours as needed for nausea or vomiting. 30 tablet 0   sertraline (ZOLOFT) 100 MG tablet Take 100 mg by mouth at bedtime.     SODIUM FLUORIDE 5000 PPM 1.1 % PSTE Take by mouth as directed.     SUMAtriptan (IMITREX) 50 MG tablet Take by mouth.     traZODone (DESYREL) 100 MG tablet Take 100 mg  by mouth at bedtime.     zoledronic acid (ZOMETA) 4 MG/5ML injection Inject into the vein. Every 6 months; due September 05, 2022     Current Facility-Administered Medications on File Prior to Visit  Medication Dose Route Frequency Provider Last Rate Last  Admin   leuprolide (LUPRON) injection 11.25 mg  11.25 mg Intramuscular Q90 days Creig Hines, MD   11.25 mg at 02/25/21 1532   leuprolide (LUPRON) injection 11.25 mg  11.25 mg Intramuscular Q90 days Creig Hines, MD   11.25 mg at 03/02/22 1545      Objective:     Vitals:   04/11/23 1522  BP: 114/83  Pulse: 99   Filed Weights   04/11/23 1522  Weight: 206 lb 14.4 oz (93.8 kg)                        Assessment:    G4P0 Patient Active Problem List   Diagnosis Date Noted   Breast asymmetry 06/04/2021   Absence of breast, acquired, bilateral 09/22/2020   Breast cancer (HCC) 09/14/2020   Anxiety 07/23/2020   Depression 07/23/2020   Genetic testing 04/14/2020   Family history of breast cancer    Family history of colon cancer    Family history of bladder cancer    Malignant neoplasm of upper-outer quadrant of right female breast (HCC) 04/03/2020   Goals of care, counseling/discussion 04/03/2020   Atrophic pancreas 03/20/2020   RUQ pain 03/03/2020   Impingement syndrome of left shoulder region 12/14/2017   Asthma, stable, mild intermittent 03/22/2017   B12 deficiency 09/19/2016     1. Establishing care with new doctor, encounter for     Patient with history of recent breast cancer and would like to have her only remaining ovary removed.   Plan:            1.  We have discussed oophorectomy and its benefits and breast cancer as well as continuation of Lupron.  Patient has decided upon oophorectomy.  Because of her multiple prior abdominal surgeries she has an increased risk of injury to abdominal organs.  This is compounded by the fact that she has significant endometriosis in the past.  We have discussed all this in detail and she would like to proceed with oophorectomy. All questions answered. Orders No orders of the defined types were placed in this encounter.   No orders of the defined types were placed in this encounter.     F/U  Return in about 3 weeks  (around 05/02/2023). I spent 32 minutes involved in the care of this patient preparing to see the patient by obtaining and reviewing her medical history (including labs, imaging tests and prior procedures), documenting clinical information in the electronic health record (EHR), counseling and coordinating care plans, writing and sending prescriptions, ordering tests or procedures and in direct communicating with the patient and medical staff discussing pertinent items from her history and physical exam.  Elonda Husky, M.D. 04/11/2023 3:47 PM

## 2023-04-14 ENCOUNTER — Ambulatory Visit (INDEPENDENT_AMBULATORY_CARE_PROVIDER_SITE_OTHER): Payer: 59 | Admitting: Plastic Surgery

## 2023-04-14 ENCOUNTER — Encounter: Payer: Self-pay | Admitting: Plastic Surgery

## 2023-04-14 VITALS — BP 126/84 | HR 94 | Ht 67.5 in | Wt 203.6 lb

## 2023-04-14 DIAGNOSIS — Z9013 Acquired absence of bilateral breasts and nipples: Secondary | ICD-10-CM

## 2023-04-14 DIAGNOSIS — N6489 Other specified disorders of breast: Secondary | ICD-10-CM

## 2023-04-14 NOTE — Progress Notes (Signed)
   Subjective:    Patient ID: [name], female    DOB: [birth year], 42 y.o.   MRN: [id number]  The patient is a 42 year old female joining me for follow-up after undergoing Lipo filling for her breast reconstruction.  The area of her breasts is looking good.  There is no sign of a hematoma or seroma.  She has some lumps and bumps which are to be expected after lipo filling.  No sign of infection.      Review of Systems  Constitutional: Negative.   Eyes: Negative.   Respiratory: Negative.    Cardiovascular: Negative.   Gastrointestinal: Negative.   Genitourinary: Negative.        Objective:   Physical Exam Cardiovascular:     Rate and Rhythm: Normal rate.  Skin:    General: Skin is warm.     Capillary Refill: Capillary refill takes less than 2 seconds.  Neurological:     Mental Status: She is oriented to person, place, and time.  Psychiatric:        Mood and Affect: Mood normal.        Behavior: Behavior normal.        Thought Content: Thought content normal.        Judgment: Judgment normal.        Assessment & Plan:     ICD-10-CM   1. Breast asymmetry  N64.89     2. Absence of breast, acquired, bilateral  Z90.13       Continue massage and sports bra.  Follow-up as needed otherwise we will see her back in 1 year. Pictures were obtained of the patient and placed in the chart with the patient's or guardian's permission.  Patient very pleased with the results and can see the difference in the pictures.

## 2023-04-16 ENCOUNTER — Other Ambulatory Visit: Payer: Self-pay | Admitting: Oncology

## 2023-04-21 ENCOUNTER — Telehealth: Payer: 59 | Admitting: Nurse Practitioner

## 2023-04-21 DIAGNOSIS — K5900 Constipation, unspecified: Secondary | ICD-10-CM | POA: Diagnosis not present

## 2023-04-21 DIAGNOSIS — R3989 Other symptoms and signs involving the genitourinary system: Secondary | ICD-10-CM

## 2023-04-21 MED ORDER — NITROFURANTOIN MONOHYD MACRO 100 MG PO CAPS
100.00 mg | ORAL_CAPSULE | Freq: Two times a day (BID) | ORAL | 0 refills | Status: AC
Start: 2023-04-21 — End: 2023-04-26

## 2023-04-21 NOTE — Progress Notes (Signed)
Virtual Visit Consent   [name], you are scheduled for a virtual visit with a [facility] provider today. Just as with appointments in the office, your consent must be obtained to participate. Your consent will be active for this visit and any virtual visit you may have with one of our providers in the next 365 days. If you have a MyChart account, a copy of this consent can be sent to you electronically.  As this is a virtual visit, video technology does not allow for your provider to perform a traditional examination. This may limit your provider's ability to fully assess your condition. If your provider identifies any concerns that need to be evaluated in person or the need to arrange testing (such as labs, EKG, etc.), we will make arrangements to do so. Although advances in technology are sophisticated, we cannot ensure that it will always work on either your end or our end. If the connection with a video visit is poor, the visit may have to be switched to a telephone visit. With either a video or telephone visit, we are not always able to ensure that we have a secure connection.  By engaging in this virtual visit, you consent to the provision of healthcare and authorize for your insurance to be billed (if applicable) for the services provided during this visit. Depending on your insurance coverage, you may receive a charge related to this service.  I need to obtain your verbal consent now. Are you willing to proceed with your visit today? [name] has provided verbal consent on 04/21/2023 for a virtual visit (video or telephone). [name] Simas, [name]  Date: 04/21/2023 3:12 PM  Virtual Visit via Video Note   I, [name], connected with  [name]  ([id number], 02-19-1981) on 04/21/23 at  3:15 PM EDT by a video-enabled telemedicine application and verified that I am speaking with the correct person using two identifiers.  Location: Patient: Virtual Visit Location Patient:  Home Provider: Virtual Visit Location Provider: Home Office   I discussed the limitations of evaluation and management by telemedicine and the availability of in person appointments. The patient expressed understanding and agreed to proceed.    History of Present Illness: [name] is a 42 y.o. who identifies as a female who was assigned female at birth, and is being seen today with sharp pains in her stomach today.  She also has tenderness to touch  Abdomen is tight to touch  Pain is central lower and into pelvic region bilaterally  Pain has been consistent   She has had body aches no fever  Denies diarrhea  She has been nauseated in waves She has vomited once  She has been eating a salad today drinking apple juice and water   She has had a colonoscopy in the past but recently  Denies diverticulitis  She has had her gallbladder and appendix removed   She did have a BM today but it was difficult to move her bowels   She has had a hysterectomy and has one ovary removed/one remaining   She does admit to not drinking enough water  Stays dehydrated  Denies any new or different foods yesterday  Did have [employer] for dinner  Denies alcohol use   She has had increased frequency and urgency in urination as well Denies fevers   She has had UTIs in the past   Problems:  Patient Active Problem List   Diagnosis Date Noted  Breast asymmetry 06/04/2021   Absence of breast, acquired, bilateral 09/22/2020   Breast cancer (HCC) 09/14/2020   Anxiety 07/23/2020   Depression 07/23/2020   Genetic testing 04/14/2020   Family history of breast cancer    Family history of colon cancer    Family history of bladder cancer    Malignant neoplasm of upper-outer quadrant of right female breast (HCC) 04/03/2020   Goals of care, counseling/discussion 04/03/2020   Atrophic pancreas 03/20/2020   RUQ pain 03/03/2020   Impingement syndrome of left shoulder region 12/14/2017   Asthma,  stable, mild intermittent 03/22/2017   B12 deficiency 09/19/2016    Allergies:  Allergies  Allergen Reactions   Bee Venom Shortness Of Breath and Swelling    Swelling at site    Fish Allergy Anaphylaxis and Shortness Of Breath   Fish-Derived Products Itching, Rash, Shortness Of Breath and Swelling   Latex Hives, Shortness Of Breath, Swelling and Rash   Penicillins Hives and Itching    Has patient had a PCN reaction causing immediate rash, facial/tongue/throat swelling, SOB or lightheadedness with hypotension: Yes Has patient had a PCN reaction causing severe rash involving mucus membranes or skin necrosis: Yes Has patient had a PCN reaction that required hospitalization No Has patient had a PCN reaction occurring within the last 10 years: No If all of the above answers are "NO", then may proceed with Cephalosporin use.    Shellfish Allergy Hives   Adhesive [Tape] Other (See Comments)    Removes skin even after only 24 hours Takes off skin   Ciprofloxacin Rash    Arm redness and swelling within mins of starting IV Cipro. Treated with benadryl Can tolerate pills, not IV form   Tapentadol Other (See Comments)    Removes skin even after only 24 hours Takes off skin   Medications:  Current Outpatient Medications:    albuterol (VENTOLIN HFA) 108 (90 Base) MCG/ACT inhaler, Inhale 2 puffs into the lungs every 6 (six) hours as needed., Disp: , Rfl:    ALPRAZolam (XANAX) 0.5 MG tablet, Take 1 tablet (0.5 mg total) by mouth 3 (three) times daily as needed for anxiety., Disp: 60 tablet, Rfl: 0   B-D UF III MINI PEN NEEDLES 31G X 5 MM MISC, Inject into the skin daily., Disp: , Rfl:    Biotin w/ Vitamins C & E (HAIR/SKIN/NAILS PO), Take 1 tablet by mouth daily., Disp: , Rfl:    buPROPion (WELLBUTRIN XL) 150 MG 24 hr tablet, Take 150 mg by mouth daily., Disp: , Rfl:    celecoxib (CELEBREX) 100 MG capsule, Take 100 mg by mouth 2 (two) times daily., Disp: , Rfl:    chlorhexidine (PERIDEX) 0.12 %  solution, Use as directed 10 mLs in the mouth or throat as needed., Disp: , Rfl:    diclofenac Sodium (VOLTAREN) 1 % GEL, Apply 4 g topically 4 (four) times daily., Disp: , Rfl:    dicyclomine (BENTYL) 10 MG capsule, Take 1 capsule (10 mg total) by mouth 4 (four) times daily -  before meals and at bedtime., Disp: 40 capsule, Rfl: 0   EPINEPHrine 0.3 mg/0.3 mL IJ SOAJ injection, Inject 0.3 mg into the muscle once as needed., Disp: , Rfl:    exemestane (AROMASIN) 25 MG tablet, TAKE 1 TABLET (25 MG TOTAL) BY MOUTH DAILY AFTER BREAKFAST., Disp: 90 tablet, Rfl: 3   fluticasone (FLONASE) 50 MCG/ACT nasal spray, Place 2 sprays into both nostrils as needed., Disp: , Rfl:    goserelin (ZOLADEX) 3.6  MG injection, Inject 3.6 mg into the skin., Disp: , Rfl:    hydrochlorothiazide (HYDRODIURIL) 25 MG tablet, Take 25 mg by mouth daily., Disp: , Rfl:    methocarbamol (ROBAXIN) 750 MG tablet, Take 750 mg by mouth in the morning and at bedtime., Disp: , Rfl:    Multiple Vitamins-Minerals (MULTIVITAMIN WITH MINERALS) tablet, Take 1 tablet by mouth at bedtime., Disp: , Rfl:    ondansetron (ZOFRAN-ODT) 4 MG disintegrating tablet, Take 1 tablet (4 mg total) by mouth every 8 (eight) hours as needed., Disp: 20 tablet, Rfl: 0   OZEMPIC, 0.25 OR 0.5 MG/DOSE, 2 MG/3ML SOPN, SMARTSIG:0.25 Milligram(s) SUB-Q Once a Week, Disp: , Rfl:    pregabalin (LYRICA) 75 MG capsule, Take 1 capsule (75 mg total) by mouth 2 (two) times daily. 1 pill(75 mg morning) and  and 2 pills (150mg  QHS), Disp: 90 capsule, Rfl: 2   prochlorperazine (COMPAZINE) 10 MG tablet, Take 1 tablet (10 mg total) by mouth every 6 (six) hours as needed (Nausea or vomiting)., Disp: 30 tablet, Rfl: 1   promethazine (PHENERGAN) 25 MG tablet, Take 0.5-1 tablets (12.5-25 mg total) by mouth every 6 (six) hours as needed for nausea or vomiting., Disp: 30 tablet, Rfl: 0   sertraline (ZOLOFT) 100 MG tablet, Take 100 mg by mouth at bedtime., Disp: , Rfl:    SODIUM FLUORIDE  5000 PPM 1.1 % PSTE, Take by mouth as directed., Disp: , Rfl:    SUMAtriptan (IMITREX) 50 MG tablet, Take by mouth., Disp: , Rfl:    traZODone (DESYREL) 100 MG tablet, Take 100 mg by mouth at bedtime., Disp: , Rfl:    zoledronic acid (ZOMETA) 4 MG/5ML injection, Inject into the vein. Every 6 months; due September 05, 2022, Disp: , Rfl:  No current facility-administered medications for this visit.  Facility-Administered Medications Ordered in Other Visits:    leuprolide (LUPRON) injection 11.25 mg, 11.25 mg, Intramuscular, Q90 days, Creig Hines, MD, 11.25 mg at 02/25/21 1532   leuprolide (LUPRON) injection 11.25 mg, 11.25 mg, Intramuscular, Q90 days, Creig Hines, MD, 11.25 mg at 03/02/22 1545  Observations/Objective: Patient is well-developed, well-nourished in no acute distress.  Resting comfortably  at home.  Head is normocephalic, atraumatic.  No labored breathing.  Speech is clear and coherent with logical content.  Patient is alert and oriented at baseline.   Assessment and Plan:  1. Suspected urinary tract infection  - nitrofurantoin, macrocrystal-monohydrate, (MACROBID) 100 MG capsule; Take 1 capsule (100 mg total) by mouth 2 (two) times daily for 5 days.  Dispense: 10 capsule; Refill: 0  2. Constipation, unspecified constipation type Advised starting Miralax tonight, increasing water intake and prune juice or orange juice for added relief  If bowels do not improve with Miralax may progress to Milk of Magnesia after 24 hours   If pain persists or with inability to fully move bowel would recommend in person follow up for evaluation and imaging of abdomen.      Follow Up Instructions: I discussed the assessment and treatment plan with the patient. The patient was provided an opportunity to ask questions and all were answered. The patient agreed with the plan and demonstrated an understanding of the instructions.  A copy of instructions were sent to the patient via MyChart  unless otherwise noted below.   The patient was advised to call back or seek an in-person evaluation if the symptoms worsen or if the condition fails to improve as anticipated.  Time:  I spent 20 minutes  with the patient via telehealth technology discussing the above problems/concerns.    [name] Simas, [name]

## 2023-04-24 NOTE — Progress Notes (Signed)
Patient is a pleasant 42 year old female with PMH of breast reconstruction who is now s/p abdominal liposuction and bilateral breast fat grafting performed 03/23/2023 by Dr. Ulice Bold who presents to clinic for postoperative follow-up.   Reviewed operative report and a total of 70 cc fat was grafted to the left breast and 30 cc grafted to the right breast.   She was last seen here in clinic 04/14/2023.  At that time, exam was benign. Recommended continued massage of the fat grafting sites in addition to continued compression.  Patient reported at that time that she was pleased with the improvement.  Follow-up only as needed.  Today, this patient states that she had been massaging her breasts bilaterally and feels as though the lumps have softened considerably.  She still feels as though the left-sided mastectomy incision has some scarring underneath that is pulling, but is not interested in any additional surgery at this time.  She states that her abdomen is still sore from the liposuction performed and given her recurrent fat grafting procedures, understands that harvesting is increasingly difficult.  She is however interested in additional NAC tattoo restoration.  Specifically, she would like to focus on the precontrast and [place] tubercles.  On exam, breasts are soft bilaterally.  Improved contour and symmetry.  No persistent masses or lumps.  No significant contracture appreciated.  Return in 4 weeks for NAC tattoo restoration.  She is cleared from postoperative standpoint with regard to her fat grafting.  Picture(s) obtained of the patient and placed in the chart were with the patient's or guardian's permission.

## 2023-04-25 ENCOUNTER — Ambulatory Visit (INDEPENDENT_AMBULATORY_CARE_PROVIDER_SITE_OTHER): Payer: 59 | Admitting: Physician Assistant

## 2023-04-25 DIAGNOSIS — Z9013 Acquired absence of bilateral breasts and nipples: Secondary | ICD-10-CM

## 2023-04-25 DIAGNOSIS — Z9889 Other specified postprocedural states: Secondary | ICD-10-CM

## 2023-04-28 ENCOUNTER — Encounter: Payer: Self-pay | Admitting: Obstetrics and Gynecology

## 2023-04-28 ENCOUNTER — Ambulatory Visit (INDEPENDENT_AMBULATORY_CARE_PROVIDER_SITE_OTHER): Payer: 59 | Admitting: Obstetrics and Gynecology

## 2023-04-28 VITALS — BP 105/72 | HR 84 | Ht 67.5 in | Wt 207.3 lb

## 2023-04-28 DIAGNOSIS — Z01818 Encounter for other preprocedural examination: Secondary | ICD-10-CM | POA: Diagnosis not present

## 2023-04-28 DIAGNOSIS — C50411 Malignant neoplasm of upper-outer quadrant of right female breast: Secondary | ICD-10-CM | POA: Diagnosis not present

## 2023-04-28 DIAGNOSIS — Z170 Estrogen receptor positive status [ER+]: Secondary | ICD-10-CM
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
## 2023-04-28 NOTE — Progress Notes (Signed)
Patient presents today for a pre-op exam prior to an oophorectomy. She states no additional concerns today.

## 2023-05-03 ENCOUNTER — Other Ambulatory Visit: Payer: Self-pay | Admitting: Oncology

## 2023-05-04 ENCOUNTER — Encounter: Payer: Self-pay | Admitting: Oncology

## 2023-05-19 ENCOUNTER — Other Ambulatory Visit: Payer: Self-pay | Admitting: Oncology

## 2023-05-19 ENCOUNTER — Encounter
Admission: RE | Admit: 2023-05-19 | Discharge: 2023-05-19 | Disposition: A | Discharge status: Home / Self Care | Payer: 59 | Source: Ambulatory Visit | Attending: Obstetrics and Gynecology | Admitting: Obstetrics and Gynecology

## 2023-05-19 ENCOUNTER — Encounter: Payer: Self-pay | Admitting: Obstetrics and Gynecology

## 2023-05-19 ENCOUNTER — Other Ambulatory Visit: Payer: Self-pay

## 2023-05-19 DIAGNOSIS — C50411 Malignant neoplasm of upper-outer quadrant of right female breast: Secondary | ICD-10-CM

## 2023-05-19 DIAGNOSIS — Z01812 Encounter for preprocedural laboratory examination: Secondary | ICD-10-CM

## 2023-05-19 HISTORY — DX: Headache, unspecified: R51.9

## 2023-05-19 NOTE — Patient Instructions (Addendum)
Your procedure is scheduled on: 05/29/2023  Monday Report to the Registration Desk on the 1st floor of the [facility].  To find out your arrival time, please call [phone] between 1PM - 3PM on: 05/26/2023 Friday If your arrival time is 6:00 am, do not arrive before that time as the [facility] entrance doors do not open until 6:00 am.  REMEMBER: Instructions that are not followed completely may result in serious medical risk, up to and including death; or upon the discretion of your surgeon and anesthesiologist your surgery may need to be rescheduled.  Do not eat food after midnight the night before surgery.  No gum chewing or hard candies.   One week prior to surgery: Stop Anti-inflammatories (NSAIDS) such as Advil, Aleve, Ibuprofen, Motrin, Naproxen, Naprosyn and Aspirin based products such as Excedrin, Goody's Powder, BC Powder. Stop ANY OVER THE COUNTER supplements until after surgery. You may however, continue to take Tylenol if needed for pain up until the day of surgery.  Continue taking all prescribed medications up to the night before surgery.      TAKE ONLY THESE MEDICATIONS THE MORNING OF SURGERY WITH A SIP OF WATER:  Lyrica 2.  Hydrochlorothiazide 3. Celebrex 4. Xanax  Use inhalers on the day of surgery and bring to the hospital.  No Alcohol for 24 hours before or after surgery.  No Smoking including e-cigarettes for 24 hours before surgery.  No chewable tobacco products for at least 6 hours before surgery.  No nicotine patches on the day of surgery.  Do not use any "recreational" drugs for at least a week (preferably 2 weeks) before your surgery.  Please be advised that the combination of cocaine and anesthesia may have negative outcomes, up to and including death. If you test positive for cocaine, your surgery will be cancelled.  On the morning of surgery brush your teeth with toothpaste and water, you may rinse your mouth with mouthwash if you wish. Do not  swallow any toothpaste or mouthwash.  Use CHG Soap or wipes as directed on instruction sheet- provided for you  Do not wear jewelry, make-up, hairpins, clips or nail polish.  Do not wear lotions, powders, or perfumes.   Do not shave body hair from the neck down 48 hours before surgery.  Contact lenses, hearing aids and dentures may not be worn into surgery.  Do not bring valuables to the hospital. [facility] is not responsible for any missing/lost belongings or valuables.   Notify your doctor if there is any change in your medical condition (cold, fever, infection).  Wear comfortable clothing (specific to your surgery type) to the hospital.  After surgery, you can help prevent lung complications by doing breathing exercises.  Take deep breaths and cough every 1-2 hours. Your doctor may order a device called an Incentive Spirometer to help you take deep breaths. If you are being admitted to the hospital overnight, leave your suitcase in the car. After surgery it may be brought to your room.  In case of increased patient census, it may be necessary for you, the patient, to continue your postoperative care in the Same Day Surgery department.  If you are being discharged the day of surgery, you will not be allowed to drive home. You will need a responsible individual to drive you home and stay with you for 24 hours after surgery.   Please call the Pre-admissions Testing Dept. at [phone] if you have any questions about these instructions.  Surgery  Visitation Policy:  Patients having surgery or a procedure may have two visitors.  Children under the age of 63 must have an adult with them who is not the patient.        Preparing for Surgery with CHLORHEXIDINE GLUCONATE (CHG) Soap  Chlorhexidine Gluconate (CHG) Soap  o An antiseptic cleaner that kills germs and bonds with the skin to continue killing germs even after washing  o Used for showering the night before surgery  and morning of surgery  Before surgery, you can play an important role by reducing the number of germs on your skin.  CHG (Chlorhexidine gluconate) soap is an antiseptic cleanser which kills germs and bonds with the skin to continue killing germs even after washing.  Please do not use if you have an allergy to CHG or antibacterial soaps. If your skin becomes reddened/irritated stop using the CHG.  1. Shower the NIGHT BEFORE SURGERY and the MORNING OF SURGERY with CHG soap.  2. If you choose to wash your hair, wash your hair first as usual with your normal shampoo.  3. After shampooing, rinse your hair and body thoroughly to remove the shampoo.  4. Use CHG as you would any other liquid soap. You can apply CHG directly to the skin and wash gently with a scrungie or a clean washcloth.  5. Apply the CHG soap to your body only from the neck down. Do not use on open wounds or open sores. Avoid contact with your eyes, ears, mouth, and genitals (private parts). Wash face and genitals (private parts) with your normal soap.  6. Wash thoroughly, paying special attention to the area where your surgery will be performed.  7. Thoroughly rinse your body with warm water.  8. Do not shower/wash with your normal soap after using and rinsing off the CHG soap.  9. Pat yourself dry with a clean towel.  10. Wear clean pajamas to bed the night before surgery.  12. Place clean sheets on your bed the night of your first shower and do not sleep with pets.  13. Shower again with the CHG soap on the day of surgery prior to arriving at the hospital.  14. Do not apply any deodorants/lotions/powders.  15. Please wear clean clothes to the hospital.

## 2023-05-22 ENCOUNTER — Encounter
Admission: RE | Admit: 2023-05-22 | Discharge: 2023-05-22 | Disposition: A | Discharge status: Home / Self Care | Payer: 59 | Source: Ambulatory Visit | Attending: Obstetrics and Gynecology | Admitting: Obstetrics and Gynecology

## 2023-05-22 ENCOUNTER — Encounter: Payer: 59 | Admitting: Physician Assistant

## 2023-05-22 DIAGNOSIS — Z79899 Other long term (current) drug therapy: Secondary | ICD-10-CM | POA: Insufficient documentation

## 2023-05-22 DIAGNOSIS — C50411 Malignant neoplasm of upper-outer quadrant of right female breast: Secondary | ICD-10-CM | POA: Insufficient documentation

## 2023-05-22 DIAGNOSIS — Z170 Estrogen receptor positive status [ER+]: Secondary | ICD-10-CM

## 2023-05-22 DIAGNOSIS — E876 Hypokalemia: Secondary | ICD-10-CM | POA: Insufficient documentation

## 2023-05-22 DIAGNOSIS — T502X5A Adverse effect of carbonic-anhydrase inhibitors, benzothiadiazides and other diuretics, initial encounter: Secondary | ICD-10-CM | POA: Diagnosis not present

## 2023-05-22 DIAGNOSIS — Z01818 Encounter for other preprocedural examination: Secondary | ICD-10-CM | POA: Insufficient documentation

## 2023-05-22 DIAGNOSIS — Z01812 Encounter for preprocedural laboratory examination: Secondary | ICD-10-CM

## 2023-05-22 LAB — CBC
HCT: 41.2 % (ref 36.0–46.0)
Hemoglobin: 14.3 g/dL (ref 12.0–15.0)
MCH: 29.9 pg (ref 26.0–34.0)
MCHC: 34.7 g/dL (ref 30.0–36.0)
MCV: 86.2 fL (ref 80.0–100.0)
Platelets: 270 10*3/uL (ref 150–400)
RBC: 4.78 MIL/uL (ref 3.87–5.11)
RDW: 12.2 % (ref 11.5–15.5)
WBC: 5.3 10*3/uL (ref 4.0–10.5)
nRBC: 0 % (ref 0.0–0.2)

## 2023-05-22 LAB — TYPE AND SCREEN
ABO/RH(D): O POS
Antibody Screen: NEGATIVE

## 2023-05-22 LAB — POTASSIUM: Potassium: 3.6 mmol/L (ref 3.5–5.1)

## 2023-05-23 ENCOUNTER — Encounter: Payer: Self-pay | Admitting: Oncology

## 2023-05-23 ENCOUNTER — Other Ambulatory Visit: Payer: Self-pay | Admitting: *Deleted

## 2023-05-23 MED ORDER — PREGABALIN 75 MG PO CAPS: 75.0000 mg | ORAL_CAPSULE | Freq: Two times a day (BID) | ORAL | 2 refills | Status: DC

## 2023-05-29 ENCOUNTER — Other Ambulatory Visit: Payer: Self-pay

## 2023-05-29 ENCOUNTER — Telehealth: Payer: Self-pay

## 2023-05-29 ENCOUNTER — Ambulatory Visit: Payer: 59 | Admitting: Urgent Care

## 2023-05-29 ENCOUNTER — Encounter: Payer: Self-pay | Admitting: Obstetrics and Gynecology

## 2023-05-29 ENCOUNTER — Ambulatory Visit
Admission: RE | Admit: 2023-05-29 | Discharge: 2023-05-29 | Disposition: A | Discharge status: Home / Self Care | Payer: 59 | Attending: Obstetrics and Gynecology | Admitting: Obstetrics and Gynecology

## 2023-05-29 ENCOUNTER — Encounter
Admission: RE | Disposition: A | Discharge status: Home / Self Care | Payer: Self-pay | Source: Home / Self Care | Attending: Obstetrics and Gynecology

## 2023-05-29 DIAGNOSIS — Z01812 Encounter for preprocedural laboratory examination: Secondary | ICD-10-CM

## 2023-05-29 DIAGNOSIS — Z9882 Breast implant status: Secondary | ICD-10-CM | POA: Diagnosis not present

## 2023-05-29 DIAGNOSIS — F419 Anxiety disorder, unspecified: Secondary | ICD-10-CM | POA: Insufficient documentation

## 2023-05-29 DIAGNOSIS — F32A Depression, unspecified: Secondary | ICD-10-CM | POA: Diagnosis not present

## 2023-05-29 DIAGNOSIS — I10 Essential (primary) hypertension: Secondary | ICD-10-CM | POA: Diagnosis not present

## 2023-05-29 DIAGNOSIS — J45909 Unspecified asthma, uncomplicated: Secondary | ICD-10-CM | POA: Diagnosis not present

## 2023-05-29 DIAGNOSIS — Z9013 Acquired absence of bilateral breasts and nipples: Secondary | ICD-10-CM | POA: Diagnosis not present

## 2023-05-29 DIAGNOSIS — Z170 Estrogen receptor positive status [ER+]: Secondary | ICD-10-CM | POA: Insufficient documentation

## 2023-05-29 DIAGNOSIS — N83292 Other ovarian cyst, left side: Secondary | ICD-10-CM | POA: Insufficient documentation

## 2023-05-29 DIAGNOSIS — Z9049 Acquired absence of other specified parts of digestive tract: Secondary | ICD-10-CM | POA: Diagnosis not present

## 2023-05-29 DIAGNOSIS — M1990 Unspecified osteoarthritis, unspecified site: Secondary | ICD-10-CM | POA: Diagnosis not present

## 2023-05-29 DIAGNOSIS — R519 Headache, unspecified: Secondary | ICD-10-CM | POA: Insufficient documentation

## 2023-05-29 DIAGNOSIS — Z9221 Personal history of antineoplastic chemotherapy: Secondary | ICD-10-CM | POA: Insufficient documentation

## 2023-05-29 DIAGNOSIS — Z79899 Other long term (current) drug therapy: Secondary | ICD-10-CM | POA: Insufficient documentation

## 2023-05-29 DIAGNOSIS — C50411 Malignant neoplasm of upper-outer quadrant of right female breast: Secondary | ICD-10-CM | POA: Diagnosis not present

## 2023-05-29 DIAGNOSIS — Z4002 Encounter for prophylactic removal of ovary: Secondary | ICD-10-CM | POA: Insufficient documentation

## 2023-05-29 DIAGNOSIS — E876 Hypokalemia: Secondary | ICD-10-CM

## 2023-05-29 HISTORY — DX: Malignant neoplasm of upper-outer quadrant of right female breast: C50.411

## 2023-05-29 HISTORY — DX: Estrogen receptor positive status (ER+): Z17.0

## 2023-05-29 SURGERY — OOPHORECTOMY, LAPAROSCOPIC
Anesthesia: General | Site: Abdomen | Laterality: Left

## 2023-05-29 MED ORDER — OXYCODONE HCL 5 MG/5ML PO SOLN: 5.0000 mg | Freq: Once | ORAL | Status: AC | PRN

## 2023-05-29 MED ORDER — OXYCODONE HCL 5 MG PO TABS
ORAL_TABLET | ORAL | Status: AC
  Filled 2023-05-29: qty 1

## 2023-05-29 MED ORDER — LIDOCAINE HCL (PF) 2 % IJ SOLN
INTRAMUSCULAR | Status: AC
  Filled 2023-05-29: qty 5

## 2023-05-29 MED ORDER — KETOROLAC TROMETHAMINE 30 MG/ML IJ SOLN
INTRAMUSCULAR | Status: AC
  Filled 2023-05-29: qty 1

## 2023-05-29 MED ORDER — FAMOTIDINE 20 MG PO TABS
ORAL_TABLET | ORAL | Status: AC
  Filled 2023-05-29: qty 1

## 2023-05-29 MED ORDER — KETOROLAC TROMETHAMINE 30 MG/ML IJ SOLN: 30.0000 mg | Freq: Once | INTRAMUSCULAR | Status: DC

## 2023-05-29 MED ORDER — HYDROMORPHONE HCL 1 MG/ML IJ SOLN
INTRAMUSCULAR | Status: AC
  Filled 2023-05-29: qty 1

## 2023-05-29 MED ORDER — FENTANYL CITRATE (PF) 100 MCG/2ML IJ SOLN
INTRAMUSCULAR | Status: DC | PRN
  Administered 2023-05-29 (×2): 50 ug via INTRAVENOUS

## 2023-05-29 MED ORDER — ACETAMINOPHEN 10 MG/ML IV SOLN
INTRAVENOUS | Status: AC
  Filled 2023-05-29: qty 100

## 2023-05-29 MED ORDER — SILVER NITRATE-POT NITRATE 75-25 % EX MISC
CUTANEOUS | Status: AC
  Filled 2023-05-29: qty 10

## 2023-05-29 MED ORDER — PHENYLEPHRINE 80 MCG/ML (10ML) SYRINGE FOR IV PUSH (FOR BLOOD PRESSURE SUPPORT)
PREFILLED_SYRINGE | INTRAVENOUS | Status: AC
  Filled 2023-05-29: qty 10

## 2023-05-29 MED ORDER — ORAL CARE MOUTH RINSE: 15.0000 mL | Freq: Once | OROMUCOSAL | Status: AC

## 2023-05-29 MED ORDER — BUPIVACAINE HCL (PF) 0.5 % IJ SOLN
INTRAMUSCULAR | Status: AC
  Filled 2023-05-29: qty 30

## 2023-05-29 MED ORDER — POVIDONE-IODINE 10 % EX SWAB
2.0000 | Freq: Once | CUTANEOUS | Status: AC
  Administered 2023-05-29: 2 via TOPICAL

## 2023-05-29 MED ORDER — DEXAMETHASONE SODIUM PHOSPHATE 10 MG/ML IJ SOLN
INTRAMUSCULAR | Status: DC | PRN
  Administered 2023-05-29: 10 mg via INTRAVENOUS

## 2023-05-29 MED ORDER — ACETAMINOPHEN 10 MG/ML IV SOLN
INTRAVENOUS | Status: DC | PRN
  Administered 2023-05-29: 1000 mg via INTRAVENOUS

## 2023-05-29 MED ORDER — LIDOCAINE HCL (CARDIAC) PF 100 MG/5ML IV SOSY
PREFILLED_SYRINGE | INTRAVENOUS | Status: DC | PRN
  Administered 2023-05-29: 100 mg via INTRAVENOUS

## 2023-05-29 MED ORDER — FENTANYL CITRATE (PF) 100 MCG/2ML IJ SOLN
INTRAMUSCULAR | Status: AC
  Filled 2023-05-29: qty 2

## 2023-05-29 MED ORDER — FAMOTIDINE 20 MG PO TABS
20.0000 mg | ORAL_TABLET | Freq: Once | ORAL | Status: AC
  Administered 2023-05-29: 20 mg via ORAL

## 2023-05-29 MED ORDER — DEXAMETHASONE SODIUM PHOSPHATE 10 MG/ML IJ SOLN
INTRAMUSCULAR | Status: AC
  Filled 2023-05-29: qty 1

## 2023-05-29 MED ORDER — PROPOFOL 10 MG/ML IV BOLUS
INTRAVENOUS | Status: AC
  Filled 2023-05-29: qty 20

## 2023-05-29 MED ORDER — LACTATED RINGERS IV SOLN: INTRAVENOUS | Status: DC

## 2023-05-29 MED ORDER — EPHEDRINE 5 MG/ML INJ
INTRAVENOUS | Status: AC
  Filled 2023-05-29: qty 5

## 2023-05-29 MED ORDER — SUGAMMADEX SODIUM 200 MG/2ML IV SOLN
INTRAVENOUS | Status: DC | PRN
  Administered 2023-05-29: 200 mg via INTRAVENOUS

## 2023-05-29 MED ORDER — EPHEDRINE SULFATE (PRESSORS) 50 MG/ML IJ SOLN
INTRAMUSCULAR | Status: DC | PRN
  Administered 2023-05-29 (×2): 5 mg via INTRAVENOUS

## 2023-05-29 MED ORDER — KETOROLAC TROMETHAMINE 30 MG/ML IJ SOLN
INTRAMUSCULAR | Status: DC | PRN
  Administered 2023-05-29: 30 mg via INTRAVENOUS

## 2023-05-29 MED ORDER — ONDANSETRON 4 MG PO TBDP: 4.0000 mg | ORAL_TABLET | Freq: Four times a day (QID) | ORAL | Status: DC | PRN

## 2023-05-29 MED ORDER — OXYCODONE-ACETAMINOPHEN 5-325 MG PO TABS: 1.0000 | ORAL_TABLET | ORAL | Status: DC | PRN

## 2023-05-29 MED ORDER — ONDANSETRON HCL 4 MG/2ML IJ SOLN: 4.0000 mg | Freq: Four times a day (QID) | INTRAMUSCULAR | Status: DC | PRN

## 2023-05-29 MED ORDER — ONDANSETRON HCL 4 MG/2ML IJ SOLN
INTRAMUSCULAR | Status: DC | PRN
  Administered 2023-05-29: 4 mg via INTRAVENOUS

## 2023-05-29 MED ORDER — PHENYLEPHRINE 80 MCG/ML (10ML) SYRINGE FOR IV PUSH (FOR BLOOD PRESSURE SUPPORT)
PREFILLED_SYRINGE | INTRAVENOUS | Status: DC | PRN
  Administered 2023-05-29 (×2): 80 ug via INTRAVENOUS
  Administered 2023-05-29: 160 ug via INTRAVENOUS
  Administered 2023-05-29: 80 ug via INTRAVENOUS

## 2023-05-29 MED ORDER — OXYCODONE HCL 5 MG PO TABS
5.0000 mg | ORAL_TABLET | Freq: Once | ORAL | Status: AC | PRN
  Administered 2023-05-29: 5 mg via ORAL

## 2023-05-29 MED ORDER — MIDAZOLAM HCL 2 MG/2ML IJ SOLN
INTRAMUSCULAR | Status: DC | PRN
  Administered 2023-05-29: 2 mg via INTRAVENOUS

## 2023-05-29 MED ORDER — PROPOFOL 10 MG/ML IV BOLUS
INTRAVENOUS | Status: DC | PRN
Start: 2023-05-29 — End: 2023-05-29
  Administered 2023-05-29: 200 mg via INTRAVENOUS

## 2023-05-29 MED ORDER — CHLORHEXIDINE GLUCONATE 0.12 % MT SOLN
OROMUCOSAL | Status: AC
  Filled 2023-05-29: qty 15

## 2023-05-29 MED ORDER — DEXTROSE IN LACTATED RINGERS 5 % IV SOLN: INTRAVENOUS | Status: DC

## 2023-05-29 MED ORDER — BUPIVACAINE HCL 0.5 % IJ SOLN
INTRAMUSCULAR | Status: DC | PRN
  Administered 2023-05-29: 12 mL

## 2023-05-29 MED ORDER — HYDROCODONE-ACETAMINOPHEN 5-325 MG PO TABS: 1.0000 | ORAL_TABLET | Freq: Four times a day (QID) | ORAL | 0 refills | Status: DC | PRN

## 2023-05-29 MED ORDER — CHLORHEXIDINE GLUCONATE 0.12 % MT SOLN
15.0000 mL | Freq: Once | OROMUCOSAL | Status: AC
  Administered 2023-05-29: 15 mL via OROMUCOSAL

## 2023-05-29 MED ORDER — HYDROMORPHONE HCL 1 MG/ML IJ SOLN
0.2500 mg | INTRAMUSCULAR | Status: DC | PRN
  Administered 2023-05-29 (×2): 0.5 mg via INTRAVENOUS
  Administered 2023-05-29 (×2): 0.25 mg via INTRAVENOUS

## 2023-05-29 MED ORDER — ONDANSETRON HCL 4 MG/2ML IJ SOLN
INTRAMUSCULAR | Status: AC
  Filled 2023-05-29: qty 2

## 2023-05-29 MED ORDER — ROCURONIUM BROMIDE 100 MG/10ML IV SOLN
INTRAVENOUS | Status: DC | PRN
  Administered 2023-05-29: 60 mg via INTRAVENOUS
  Administered 2023-05-29: 20 mg via INTRAVENOUS

## 2023-05-29 MED ORDER — MIDAZOLAM HCL 2 MG/2ML IJ SOLN
INTRAMUSCULAR | Status: AC
  Filled 2023-05-29: qty 2

## 2023-05-29 SURGICAL SUPPLY — 33 items
ADH LQ OCL WTPRF AMP STRL LF (MISCELLANEOUS) ×1
ADH SKN CLS APL DERMABOND .7 (GAUZE/BANDAGES/DRESSINGS) ×1
ADHESIVE MASTISOL STRL (MISCELLANEOUS) ×1 IMPLANT
BAG DRN RND TRDRP ANRFLXCHMBR (UROLOGICAL SUPPLIES) ×1
BAG URINE DRAIN 2000ML AR STRL (UROLOGICAL SUPPLIES) ×1 IMPLANT
BLADE SURG 10 STRL SS SAFETY (BLADE) ×1 IMPLANT
BLADE SURG SZ11 CARB STEEL (BLADE) ×1 IMPLANT
DERMABOND ADVANCED .7 DNX12 (GAUZE/BANDAGES/DRESSINGS) ×1 IMPLANT
ELECT REM PT RETURN 9FT ADLT (ELECTROSURGICAL) ×1
ELECTRODE REM PT RTRN 9FT ADLT (ELECTROSURGICAL) ×1 IMPLANT
GLOVE PI ORTHO PRO STRL 7.5 (GLOVE) ×1 IMPLANT
GOWN STRL REUS W/ TWL LRG LVL3 (GOWN DISPOSABLE) ×2 IMPLANT
GOWN STRL REUS W/ TWL XL LVL3 (GOWN DISPOSABLE) ×2 IMPLANT
GOWN STRL REUS W/TWL LRG LVL3 (GOWN DISPOSABLE) ×1
GOWN STRL REUS W/TWL XL LVL3 (GOWN DISPOSABLE) ×2
HANDLE YANKAUER SUCT BULB TIP (MISCELLANEOUS) ×1 IMPLANT
KIT PINK PAD W/HEAD ARE REST (MISCELLANEOUS) ×1
KIT PINK PAD W/HEAD ARM REST (MISCELLANEOUS) ×1 IMPLANT
LIGASURE LAP MARYLAND 5MM 37CM (ELECTROSURGICAL) IMPLANT
PACK GYN LAPAROSCOPIC (MISCELLANEOUS) ×1 IMPLANT
SCRUB CHG 4% DYNA-HEX 4OZ (MISCELLANEOUS) ×1 IMPLANT
SLEEVE Z-THREAD 5X100MM (TROCAR) ×2 IMPLANT
SOL ELECTROSURG ANTI STICK (MISCELLANEOUS) ×1
SOLUTION ELECTROSURG ANTI STCK (MISCELLANEOUS) ×1 IMPLANT
SUT VIC AB 0 CT1 36 (SUTURE) ×2 IMPLANT
SUT VIC AB 4-0 FS2 27 (SUTURE) ×1 IMPLANT
SYR 10ML LL (SYRINGE) ×1 IMPLANT
SYR CONTROL 10ML LL (SYRINGE) ×1 IMPLANT
SYS BAG RETRIEVAL 10MM (BASKET) ×1
SYSTEM BAG RETRIEVAL 10MM (BASKET) IMPLANT
TROCAR Z-THREAD FIOS 11X100 BL (TROCAR) IMPLANT
TROCAR Z-THREAD FIOS 5X100MM (TROCAR) ×1 IMPLANT
TUBING EVAC SMOKE HEATED PNEUM (TUBING) ×1 IMPLANT

## 2023-05-29 NOTE — Anesthesia Postprocedure Evaluation (Signed)
Anesthesia Post Note  Patient: [name]  Procedure(s) Performed: LAPAROSCOPIC LEFT OOPHORECTOMY (Left: Abdomen)  Patient location during evaluation: PACU Anesthesia Type: General Level of consciousness: awake and alert Pain management: pain level controlled Vital Signs Assessment: post-procedure vital signs reviewed and stable Respiratory status: spontaneous breathing, nonlabored ventilation, respiratory function stable and patient connected to nasal cannula oxygen Cardiovascular status: blood pressure returned to baseline and stable Postop Assessment: no apparent nausea or vomiting Anesthetic complications: no   No notable events documented.   Last Vitals:  Vitals:   05/29/23 0920 05/29/23 0930  BP:    Pulse: 77 79  Resp: 16   Temp:    SpO2: 98% 95%    Last Pain:  Vitals:   05/29/23 0930  TempSrc:   PainSc: 7                  Louie Boston

## 2023-05-29 NOTE — Discharge Instructions (Signed)

## 2023-05-29 NOTE — OR Nursing (Signed)
 In and out urinary catheter performed by surgeon at case start

## 2023-05-29 NOTE — Op Note (Signed)
     OPERATIVE NOTE 05/29/2023 9:09 AM  PRE-OPERATIVE DIAGNOSIS:  1) ER/PR Positive breast cancer 2) Desires L oophorectomy  POST-OPERATIVE DIAGNOSIS:  1) Same  OPERATION:  Laparoscopic LSO  SURGEON(S): Surgeons and Role:    Linzie Collin, MD - Primary    Hildred Laser, MD - Assisting   ANESTHESIA: Choice  ESTIMATED BLOOD LOSS: 10 mL  OPERATIVE FINDINGS: Some Right-sided pelvic adhesions to omentum.  Otherwise no E-osis noted. L ovary stuck to L sidewall but not densely adherent.  SPECIMEN:  ID Type Source Tests Collected by Time Destination  1 : Left Ovary GYN Ovary, Left SURGICAL PATHOLOGY Linzie Collin, MD 05/29/2023 [phone]     COMPLICATIONS: None  DISPOSITION: Stable to recovery room  DESCRIPTION OF PROCEDURE:      The patient was prepped and draped in the dorsolithotomy position and placed under general anesthesia. The bladder was emptied. Two moist sponges were placed in the vagina on a spongestick. After changing gloves we proceeded abdominally. A small infraumbilical incision was made and a 5 mm trocar port was placed within the abdominopelvic cavity. The opening pressure was less than 7 mmHg.  Approximately 3 and 1/2 L of carbon dioxide gas was instilled within the abdominal pelvic cavity. The laparoscope was placed and the pelvis and abdomen were carefully inspected.  Under direct visualization right and left lower quadrant ports were placed. The pelvis was inspected and R sided adhesions to omentum were noted.  These were systematically lysed with the ligasure. The left infundibulopelvic ligament was identified. The ureter was identified out of the operative field. The ovarie and tube were retracted medially.  The infundibulopelvic ligament was triply coagulated and divided.  The ovary was systematically peeled off of the left sidewall using the ligasure. The specimen was placed in an endocatch bag. Hemostasis of the pedicles was noted.  The bag was removed  through the L lower quadrant port. Using the PMI, the fascia was closed in the L lower quadrant port.  Hemostasis of all areas of the pelvis was noted. The remaining ports were removed under direct visualization.  Hemostasis was noted.  The laparoscope was removed the gas was allowed to escape and the trocar sleeve was removed. The incisions were closed by subcuticular closure of the skin for all port sites. A long-acting anesthetic was injected.  Dermabond was applied. The spones were removed from the vagina. The patient went to the recovery room in stable condition.  Elonda Husky, M.D. 05/29/2023 9:09 AM
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
## 2023-05-29 NOTE — Telephone Encounter (Signed)
Harveen called triage line, stating her pharmacy called her stating she needs an alternative pain medication.

## 2023-05-29 NOTE — Anesthesia Procedure Notes (Signed)
Procedure Name: Intubation Date/Time: 05/29/2023 7:44 AM  Performed by: Elisabeth Pigeon, CRNAPre-anesthesia Checklist: Patient identified, Patient being monitored, Timeout performed, Emergency Drugs available and Suction available Patient Re-evaluated:Patient Re-evaluated prior to induction Oxygen Delivery Method: Circle system utilized Preoxygenation: Pre-oxygenation with 100% oxygen Induction Type: IV induction Ventilation: Mask ventilation without difficulty Laryngoscope Size: Mac, 3 and Glidescope Grade View: Grade I Tube type: Oral Tube size: 7.0 mm Number of attempts: 1 Airway Equipment and Method: Stylet Placement Confirmation: ETT inserted through vocal cords under direct vision, positive ETCO2 and breath sounds checked- equal and bilateral Secured at: 21 cm Tube secured with: Tape Dental Injury: Teeth and Oropharynx as per pre-operative assessment
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
## 2023-05-29 NOTE — Transfer of Care (Signed)
Immediate Anesthesia Transfer of Care Note  Patient: [name]  Procedure(s) Performed: LAPAROSCOPIC LEFT OOPHORECTOMY (Left)  Patient Location: PACU  Anesthesia Type:General  Level of Consciousness: awake  Airway & Oxygen Therapy: Patient Spontanous Breathing and Patient connected to face mask oxygen  Post-op Assessment: Report given to RN and Post -op Vital signs reviewed and stable  Post vital signs: Reviewed and stable  Last Vitals:  Vitals Value Taken Time  BP 115/61 05/29/23 0915  Temp    Pulse 76 05/29/23 0916  Resp 17 05/29/23 0916  SpO2 100 % 05/29/23 0916  Vitals shown include unfiled device data.  Last Pain:  Vitals:   05/29/23 0629  TempSrc: Temporal  PainSc: 6          Complications: No notable events documented.

## 2023-05-30 ENCOUNTER — Other Ambulatory Visit: Payer: Self-pay | Admitting: Obstetrics and Gynecology

## 2023-05-30 ENCOUNTER — Encounter: Payer: Self-pay | Admitting: Obstetrics and Gynecology

## 2023-05-30 MED ORDER — OXYCODONE-ACETAMINOPHEN 5-325 MG PO TABS: 1.0000 | ORAL_TABLET | ORAL | 0 refills | Status: DC | PRN

## 2023-05-30 NOTE — Telephone Encounter (Signed)
Patient aware new rx has been sent in to pharmacy.

## 2023-05-30 NOTE — Telephone Encounter (Signed)
New rx has been sent to pharmacy on file. Patient is aware.

## 2023-05-30 NOTE — Telephone Encounter (Signed)
LVM for patient to understand her needs.

## 2023-05-30 NOTE — Telephone Encounter (Signed)
TRIAGE VOICEMAIL: Patient returning Taylor's call. This medication is on back order at my pharmacy. They stated if you could send an alternative instead and that they reached out yesterday. Please let me know. Tylenol isn't touching pain at all right now.

## 2023-06-01 ENCOUNTER — Encounter: Payer: Self-pay | Admitting: Obstetrics and Gynecology

## 2023-06-06 ENCOUNTER — Ambulatory Visit (INDEPENDENT_AMBULATORY_CARE_PROVIDER_SITE_OTHER): Payer: 59 | Admitting: Obstetrics and Gynecology

## 2023-06-06 ENCOUNTER — Encounter: Payer: Self-pay | Admitting: Obstetrics and Gynecology

## 2023-06-06 ENCOUNTER — Encounter: Payer: 59 | Admitting: Physician Assistant

## 2023-06-06 DIAGNOSIS — Z4889 Encounter for other specified surgical aftercare: Secondary | ICD-10-CM

## 2023-06-06 DIAGNOSIS — Z9889 Other specified postprocedural states: Secondary | ICD-10-CM

## 2023-06-06 NOTE — Progress Notes (Signed)
Patient presents for 1 week postop follow-up following oophorectomy. She states still experiencing pain, mainly after a long day and with bending, still taking pain medication when needed at night. Patient reports trouble with nausea, has been taking compazine for relief.

## 2023-06-06 NOTE — Progress Notes (Signed)
HPI:      [name] is a 42 y.o. G4P0 who LMP was Patient's last menstrual period was 05/06/2010 (within years).  Subjective:   She presents today 1 week from left oophorectomy via laparoscopy.  She reports that she is doing well.  She returned to work 2 days after her surgery.  She still has some pulling at her incisions and occasional left-sided pelvic pain but this is well-controlled.  She is eating and going to the bathroom without issue.    Hx: The following portions of the patient's history were reviewed and updated as appropriate:             She  has a past medical history of Anemia, Anxiety, Asthma, Atrophic pancreas, Breast cancer (HCC) (04/2020), Chronic back pain, Depression, Family history of bladder cancer, Family history of breast cancer, Family history of colon cancer, GERD (gastroesophageal reflux disease) (04/2020), Headache, History of kidney stones, Hypertension, Incisional hernia (08/2022), Insomnia, Malignant neoplasm of upper-outer quadrant of right breast in female, estrogen receptor positive (HCC), Seizure (HCC) (1999), and Umbilical hernia (08/2022). She does not have any pertinent problems on file. She  has a past surgical history that includes Appendectomy; Cesarean section; Abdominal hysterectomy; Dilation and curettage of uterus; Cholecystectomy; Wisdom tooth extraction; Finger surgery (Left, 2003); Partial mastectomy with needle localization and axillary sentinel lymph node bx (Right, 05/07/2020); Breast biopsy (Right, 04/02/2020); Re-excision of breast lumpectomy (Right, 05/28/2020); Portacath placement (N/A, 05/28/2020); Total mastectomy (Bilateral, 09/14/2020); Port-a-cath removal (09/14/2020); Breast reconstruction with placement of tissue expander and flex hd (acellular hydrated dermis) (Bilateral, 09/14/2020); Removal of bilateral tissue expanders with placement of bilateral breast implants (Bilateral, 01/06/2021); Liposuction with lipofilling (Bilateral,  07/08/2021); Cyst excision (Right, 07/08/2021); Liposuction with lipofilling (Bilateral, 02/16/2022); XI robotic assisted ventral hernia (N/A, 09/02/2022); Cyst excision (N/A, 09/02/2022); Umbilical hernia repair (N/A, 09/02/2022); Insertion of mesh (09/02/2022); and Liposuction with lipofilling (Bilateral, 03/23/2023). Her family history includes Anxiety disorder in her mother; Bladder Cancer in her paternal aunt; Breast cancer in her maternal great-grandmother; Breast cancer (age of onset: 50) in her maternal aunt; CAD in her father; Colon cancer in her maternal grandfather and maternal uncle; Irritable bowel syndrome in her mother; Non-Hodgkin's lymphoma (age of onset: 16) in her maternal grandmother. She  reports that she has never smoked. She has never used smokeless tobacco. She reports current alcohol use. She reports that she does not use drugs. She has a current medication list which includes the following prescription(s): albuterol, alprazolam, b-d uf iii mini pen needles, biotin w/ vitamins c & e, bupropion, celecoxib, chlorhexidine, diclofenac sodium, dicyclomine, epinephrine, exemestane, fluticasone, goserelin, hydrochlorothiazide, methocarbamol, multivitamin with minerals, ondansetron, oxycodone-acetaminophen, pregabalin, prochlorperazine, promethazine, sodium fluoride 5000 ppm, trazodone, zoledronic acid, and sumatriptan, and the following Facility-Administered Medications: leuprolide and leuprolide. She is allergic to bee venom, fish allergy, fish-derived products, latex, penicillins, shellfish allergy, adhesive [tape], ciprofloxacin, and tapentadol.       Review of Systems:  Review of Systems  Constitutional: Denied constitutional symptoms, night sweats, recent illness, fatigue, fever, insomnia and weight loss.  Eyes: Denied eye symptoms, eye pain, photophobia, vision change and visual disturbance.  Ears/Nose/Throat/Neck: Denied ear, nose, throat or neck symptoms, hearing loss, nasal  discharge, sinus congestion and sore throat.  Cardiovascular: Denied cardiovascular symptoms, arrhythmia, chest pain/pressure, edema, exercise intolerance, orthopnea and palpitations.  Respiratory: Denied pulmonary symptoms, asthma, pleuritic pain, productive sputum, cough, dyspnea and wheezing.  Gastrointestinal: Denied, gastro-esophageal reflux, melena, nausea and vomiting.  Genitourinary: Denied genitourinary symptoms including symptomatic vaginal  discharge, pelvic relaxation issues, and urinary complaints.  Musculoskeletal: Denied musculoskeletal symptoms, stiffness, swelling, muscle weakness and myalgia.  Dermatologic: Denied dermatology symptoms, rash and scar.  Neurologic: Denied neurology symptoms, dizziness, headache, neck pain and syncope.  Psychiatric: Denied psychiatric symptoms, anxiety and depression.  Endocrine: Denied endocrine symptoms including hot flashes and night sweats.   Meds:   Current Outpatient Medications on File Prior to Visit  Medication Sig Dispense Refill   albuterol (VENTOLIN HFA) 108 (90 Base) MCG/ACT inhaler Inhale 2 puffs into the lungs every 6 (six) hours as needed.     ALPRAZolam (XANAX) 0.5 MG tablet Take 1 tablet (0.5 mg total) by mouth 3 (three) times daily as needed for anxiety. 60 tablet 0   B-D UF III MINI PEN NEEDLES 31G X 5 MM MISC Inject into the skin daily.     Biotin w/ Vitamins C & E (HAIR/SKIN/NAILS PO) Take 1 tablet by mouth daily.     buPROPion (WELLBUTRIN XL) 150 MG 24 hr tablet Take 150 mg by mouth daily.     celecoxib (CELEBREX) 100 MG capsule Take 100 mg by mouth 2 (two) times daily.     chlorhexidine (PERIDEX) 0.12 % solution Use as directed 10 mLs in the mouth or throat as needed.     diclofenac Sodium (VOLTAREN) 1 % GEL Apply 4 g topically 4 (four) times daily.     dicyclomine (BENTYL) 10 MG capsule Take 1 capsule (10 mg total) by mouth 4 (four) times daily -  before meals and at bedtime. 40 capsule 0   EPINEPHrine 0.3 mg/0.3 mL IJ  SOAJ injection Inject 0.3 mg into the muscle once as needed.     exemestane (AROMASIN) 25 MG tablet TAKE 1 TABLET (25 MG TOTAL) BY MOUTH DAILY AFTER BREAKFAST. 90 tablet 3   fluticasone (FLONASE) 50 MCG/ACT nasal spray Place 2 sprays into both nostrils as needed.     goserelin (ZOLADEX) 3.6 MG injection Inject 3.6 mg into the skin.     hydrochlorothiazide (HYDRODIURIL) 25 MG tablet Take 25 mg by mouth daily.     methocarbamol (ROBAXIN) 750 MG tablet Take 750 mg by mouth in the morning and at bedtime.     Multiple Vitamins-Minerals (MULTIVITAMIN WITH MINERALS) tablet Take 1 tablet by mouth at bedtime.     ondansetron (ZOFRAN-ODT) 4 MG disintegrating tablet Take 1 tablet (4 mg total) by mouth every 8 (eight) hours as needed. 20 tablet 0   oxyCODONE-acetaminophen (PERCOCET/ROXICET) 5-325 MG tablet Take 1 tablet by mouth every 4 (four) hours as needed for severe pain. 20 tablet 0   pregabalin (LYRICA) 75 MG capsule Take 1 capsule (75 mg total) by mouth 2 (two) times daily. 1 pill(75 mg morning) and  and 2 pills (150mg  QHS) 90 capsule 2   prochlorperazine (COMPAZINE) 10 MG tablet Take 1 tablet (10 mg total) by mouth every 6 (six) hours as needed (Nausea or vomiting). 30 tablet 1   promethazine (PHENERGAN) 25 MG tablet Take 0.5-1 tablets (12.5-25 mg total) by mouth every 6 (six) hours as needed for nausea or vomiting. 30 tablet 0   SODIUM FLUORIDE 5000 PPM 1.1 % PSTE Take by mouth as directed.     traZODone (DESYREL) 100 MG tablet Take 100 mg by mouth at bedtime.     zoledronic acid (ZOMETA) 4 MG/5ML injection Inject into the vein. Every 6 months; due September 05, 2022     SUMAtriptan (IMITREX) 50 MG tablet Take by mouth.     Current Facility-Administered Medications  on File Prior to Visit  Medication Dose Route Frequency Provider Last Rate Last Admin   leuprolide (LUPRON) injection 11.25 mg  11.25 mg Intramuscular Q90 days Creig Hines, MD   11.25 mg at 02/25/21 1532   leuprolide (LUPRON) injection  11.25 mg  11.25 mg Intramuscular Q90 days Creig Hines, MD   11.25 mg at 03/02/22 1545      Objective:     There were no vitals filed for this visit. There were no vitals filed for this visit.             Abdomen: Soft.  Non-tender.  No masses.  No HSM.  Incision/s: Intact.  Healing well.  No erythema.  No drainage.             Assessment:    G4P0 Patient Active Problem List   Diagnosis Date Noted   Breast asymmetry 06/04/2021   Absence of breast, acquired, bilateral 09/22/2020   Breast cancer (HCC) 09/14/2020   Anxiety 07/23/2020   Depression 07/23/2020   Genetic testing 04/14/2020   Family history of breast cancer    Family history of colon cancer    Family history of bladder cancer    Malignant neoplasm of upper-outer quadrant of right female breast (HCC) 04/03/2020   Goals of care, counseling/discussion 04/03/2020   Atrophic pancreas 03/20/2020   RUQ pain 03/03/2020   Impingement syndrome of left shoulder region 12/14/2017   Asthma, stable, mild intermittent 03/22/2017   B12 deficiency 09/19/2016     1. Postoperative state     Excellent recovery   Plan:            1.  Discussed surgery, pelvic adhesions, and pathology report.  All questions answered.  She has returned to normal activities. Orders No orders of the defined types were placed in this encounter.   No orders of the defined types were placed in this encounter.     F/U  Return for Annual Physical.  Elonda Husky, M.D. 06/06/2023 11:07 AM

## 2023-06-07 NOTE — Telephone Encounter (Signed)
Patient rescheduled.

## 2023-06-13 ENCOUNTER — Encounter: Payer: 59 | Admitting: Obstetrics and Gynecology

## 2023-06-22 ENCOUNTER — Encounter: Payer: Self-pay | Admitting: Oncology

## 2023-06-23 ENCOUNTER — Encounter: Payer: Self-pay | Admitting: Oncology

## 2023-06-23 ENCOUNTER — Telehealth: Payer: 59 | Admitting: Physician Assistant

## 2023-06-23 DIAGNOSIS — R6889 Other general symptoms and signs: Secondary | ICD-10-CM

## 2023-06-23 MED ORDER — IPRATROPIUM BROMIDE 0.03 % NA SOLN
2.0000 | Freq: Two times a day (BID) | NASAL | 0 refills | Status: AC
Start: 2023-06-23 — End: ?

## 2023-06-23 MED ORDER — BENZONATATE 100 MG PO CAPS
100.0000 mg | ORAL_CAPSULE | Freq: Three times a day (TID) | ORAL | 0 refills | Status: AC | PRN
Start: 2023-06-23 — End: ?

## 2023-06-23 MED ORDER — NAPROXEN 500 MG PO TABS
500.0000 mg | ORAL_TABLET | Freq: Two times a day (BID) | ORAL | 0 refills | Status: DC
Start: 2023-06-23 — End: 2023-12-08

## 2023-06-23 NOTE — Telephone Encounter (Signed)
I am not sure what I can do. I fill it when I get the request. Perhaps she needs to call pharmacy?

## 2023-06-23 NOTE — Progress Notes (Signed)
E visit for Flu like symptoms   We are sorry that you are not feeling well.  Here is how we plan to help! Based on what you have shared with me it looks like you may have flu-like symptoms that should be watched but do not seem to indicate anti-viral treatment.  Influenza or "the flu" is   an infection caused by a respiratory virus. The flu virus is highly contagious and persons who did not receive their yearly flu vaccination may "catch" the flu from close contact.  Based upon your symptoms and potential risk factors I recommend that you follow the flu symptoms recommendation that I have listed below.  This is an infection that is most likely caused by a virus. There are no specific treatments other than to help you with the symptoms until the infection runs its course.  We are sorry you are not feeling well.  Here is how we plan to help!  For nasal congestion, you may use an oral decongestants such as Mucinex D or if you have glaucoma or high blood pressure use plain Mucinex.  Saline nasal spray or nasal drops can help and can safely be used as often as needed for congestion.  For your congestion, I have prescribed Ipratropium Bromide nasal spray 0.03% two sprays in each nostril 2-3 times a day  If you do not have a history of heart disease, hypertension, diabetes or thyroid disease, prostate/bladder issues or glaucoma, you may also use Sudafed to treat nasal congestion.  It is highly recommended that you consult with a pharmacist or your primary care physician to ensure this medication is safe for you to take.     If you have a cough, you may use cough suppressants such as Delsym and Robitussin.  If you have glaucoma or high blood pressure, you can also use Coricidin HBP.   For cough I have prescribed for you A prescription cough medication called Tessalon Perles 100 mg. You may take 1-2 capsules every 8 hours as needed for cough  I have prescribed Naproxen 500mg  Take 1 tablet twice daily as  needed for body aches.  If you have a sore or scratchy throat, use a saltwater gargle-  to  teaspoon of salt dissolved in a 4-ounce to 8-ounce glass of warm water.  Gargle the solution for approximately 15-30 seconds and then spit.  It is important not to swallow the solution.  You can also use throat lozenges/cough drops and Chloraseptic spray to help with throat pain or discomfort.  Warm or cold liquids can also be helpful in relieving throat pain.  For headache, pain or general discomfort, you can use Ibuprofen or Tylenol as directed.   Some authorities believe that zinc sprays or the use of Echinacea may shorten the course of your symptoms.   ANYONE WHO HAS FLU SYMPTOMS SHOULD: Stay home. The flu is highly contagious and going out or to work exposes others! Be sure to drink plenty of fluids. Water is fine as well as fruit juices, sodas and electrolyte beverages. You may want to stay away from caffeine or alcohol. If you are nauseated, try taking small sips of liquids. How do you know if you are getting enough fluid? Your urine should be a pale yellow or almost colorless. Get rest. Taking a steamy shower or using a humidifier may help nasal congestion and ease sore throat pain. Using a saline nasal spray works much the same way. Cough drops, hard candies and sore throat lozenges  may ease your cough. Line up a caregiver. Have someone check on you regularly.   GET HELP RIGHT AWAY IF: You cannot keep down liquids or your medications. You become short of breath Your fell like you are going to pass out or loose consciousness. Your symptoms persist after you have completed your treatment plan MAKE SURE YOU  Understand these instructions. Will watch your condition. Will get help right away if you are not doing well or get worse.  Your e-visit answers were reviewed by a board certified advanced clinical practitioner to complete your personal care plan.  Depending on the condition, your plan  could have included both over the counter or prescription medications.  If there is a problem please reply  once you have received a response from your provider.  Your safety is important to [place].  If you have drug allergies check your prescription carefully.    You can use MyChart to ask questions about today's visit, request a non-urgent call back, or ask for a work or school excuse for 24 hours related to this e-Visit. If it has been greater than 24 hours you will need to follow up with your provider, or enter a new e-Visit to address those concerns.  You will get an e-mail in the next two days asking about your experience.  I hope that your e-visit has been valuable and will speed your recovery. Thank you for using e-visits.  I have spent 5 minutes in review of e-visit questionnaire, review and updating patient chart, medical decision making and response to patient.   Margaretann Loveless, PA-C

## 2023-06-27 ENCOUNTER — Other Ambulatory Visit: Payer: Self-pay | Admitting: *Deleted

## 2023-06-27 ENCOUNTER — Telehealth: Payer: Self-pay | Admitting: *Deleted

## 2023-06-27 NOTE — Telephone Encounter (Signed)
Pt had few things to talk about. 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 She also has issues when there is 31 days and there is a issue with that. I called the pharmacy and spoke to a person and I asked if it is ok to send refill's . He says that if the

## 2023-06-27 NOTE — Telephone Encounter (Signed)
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

## 2023-07-07 ENCOUNTER — Inpatient Hospital Stay: Payer: 59 | Attending: Oncology

## 2023-07-26 ENCOUNTER — Encounter: Payer: 59 | Admitting: Surgical

## 2023-07-26 ENCOUNTER — Encounter: Payer: 59 | Admitting: Physician Assistant

## 2023-07-28 ENCOUNTER — Telehealth: Payer: 59 | Admitting: Plastic Surgery

## 2023-08-18 ENCOUNTER — Encounter: Payer: Self-pay | Admitting: Physician Assistant

## 2023-08-18 ENCOUNTER — Ambulatory Visit (INDEPENDENT_AMBULATORY_CARE_PROVIDER_SITE_OTHER): Payer: 59 | Admitting: Physician Assistant

## 2023-08-18 VITALS — BP 113/79 | HR 84 | Ht 67.0 in | Wt 220.0 lb

## 2023-08-18 DIAGNOSIS — Z170 Estrogen receptor positive status [ER+]: Secondary | ICD-10-CM

## 2023-08-18 DIAGNOSIS — Z9013 Acquired absence of bilateral breasts and nipples: Secondary | ICD-10-CM | POA: Diagnosis not present

## 2023-08-18 DIAGNOSIS — Z853 Personal history of malignant neoplasm of breast: Secondary | ICD-10-CM

## 2023-08-18 NOTE — Progress Notes (Signed)
NIPPLE AREOLAR TATTOO PROCEDURE   PREOPERATIVE DIAGNOSIS:  Acquired absence of bilateral nipple areolar    POSTOPERATIVE DIAGNOSIS: Acquired absence of bilateral nipple areolar     PROCEDURES: Bilateral nipple areolar tattoo    ANESTHESIA:  EMLA, 4% Lidocaine with epinephrine   COMPLICATIONS: None.   JUSTIFICATION FOR PROCEDURE:  [name] is a 41 y.o. female with a history of breast cancer status post breast reconstruction. The patient presents for nipple areolar complex tattoo. Risks, benefits, indications, and alternatives of the above described procedures were discussed with the patient and all the patient's questions were answered.    05/20/2022  She has a history of bilateral mastectomies with expander reconstruction and ultimate implant exchange performed 01/06/2021 by Dr. Ulice Bold.  At the beginning of the encounter, we discussed her goals for nipple areolar tattooing.  She had considered possible options including non-areolar postmastectomy tattoos, but reports that she has been eagerly awaiting her opportunity for nipple areolar tattooing.     With regard to possible colors, she states that she would like to be similar to how she was prior to her bilateral mastectomies.  Reviewed images from 07/2020.  She states that her only preference is that the color is slightly less pink and more tan/darker than how she was preoperatively.  We also discussed positioning of her nipples and have decided that they would be higher and more centered on the breast than they were preoperatively.  She is more of a Fitzpatrick 1-2, but has recently dyed her hair dark brown/black and has been going to a tanning bed.   07/29/2022 Patient reports that she has had considerable fading of the areola, but the inferior shading remained prominent.  She was quite pleased with the color at the conclusion of her last visit and is not looking to make changes with color of areola.  She is hoping that she can have  the nipple color done today.  Discussed slightly darker shade than the areola to add contrast.  Additionally, discussed placement of Montgomery tubercles for added detail.  Given the prominence of inferior nipple shading from initial encounter, will not add any additional shading today.  Overall, she is pleased.   09/23/2022 When patient came in today, she did have some concerns.  She told me that the areola color has faded since last visit and appears to peachy rather than pink that she had preoperatively.  While she states that she is a natural redhead with fair skin that burns easily, recently she has been tanning and dying her hair dark.  Discussed trying to go from a peachy/pink areolar color to a slightly tender collar for more natural outcome.  She is completely in agreement and is willing to go darker.  Mixed completely new colors and she was agreeable with proceeding in the new direction.  She also states that she would like for the nipple to be darker relative to the areola.   11/21/2022 Today, patient states that her areola color from last treatment faded quickly light leaving her with the same peach ring areola from before.  She is now excepting that we need to go even darker with her areolar restoration tattooing.  After discussing with patient, we will focus on both the areola and nipple colors today, defer nipple shading and Montgomery tubercles to a subsequent encounter.  She did say that she was interested in additional detailing with spiral nipple shading.     01/17/2023 Today, patient states that she is increasingly pleased with her  NAC restoration tattooing, but states that the areolas have faded a bit.  She also reports that she has recently started tanning again which also contributes to the tattoos appearing less pronounced.  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  08/18/2023 Today, patient is interested in additional nipple areolar  restoration tattooing.  She states that she is still hoping that her areolas could be appearing more pink rather than peachy.  We have discussed during her postoperative visits after recent fat grafting that we had new ink available and she was interested in moving forward with the new ink selection.  Will move forward with both areola and nipple filling today.   DESCRIPTION OF PROCEDURE: After written informed consent was obtained and proper identification of patient and surgical site was made, the patient was taken to the procedure room and placed supine on the operating room table. A time out was performed to confirm patient's identity and surgical site. The patient was prepped and draped in the usual sterile fashion. Alcohol swabs were used. Attention was turned to the selection of flesh colored permanent tattoo ink to produce the appropriate color for nipple areolar complex tattooing. Using a digital revo 7R tattoo head, pointillism pigment was instilled to the designed nipple areolar complex which was confirmed preoperatively with the patient using the Digital-Pop Deluxe machine by [employer]. Spiral technique was used to fill out the areola.  Attention was then directed towards the nipples and utilization of a lighter pigment for fine detailing.  Similar ink was used for creation of a "halo" effect around the nipples and then for application of the Montgomery tubercles.  Once adequate pigment had been applied to the nipple areolar complex, Xeroform dressing was applied followed by 4 x 4 gauze and secured with Medipore tape. There were no complications and the procedure was tolerated without difficulty.     Areola-color: [name] Areola 1 mixed with [name] in a 10:1 FM1:SW ratio  Fair maiden: Fair Maiden Nipple, unmixed Additional:  Colors EXP: 10/2024   Nipples are each approximately 4.25 cm diameter at conclusion of visit.   Patient will be scheduled for telephone visit in 4 weeks  to discuss whether or not she would like to proceed with additional tattooing.  She will try to upload images at that time.   Picture(s) obtained of the patient and placed in the chart were with the patient's or guardian's permission.

## 2023-08-31 ENCOUNTER — Other Ambulatory Visit: Payer: Self-pay | Admitting: Oncology

## 2023-09-01 ENCOUNTER — Encounter: Payer: Self-pay | Admitting: Oncology

## 2023-09-08 ENCOUNTER — Other Ambulatory Visit: Payer: Self-pay

## 2023-09-08 DIAGNOSIS — Z170 Estrogen receptor positive status [ER+]: Secondary | ICD-10-CM

## 2023-09-11 ENCOUNTER — Inpatient Hospital Stay: Payer: 59

## 2023-09-11 ENCOUNTER — Inpatient Hospital Stay (HOSPITAL_BASED_OUTPATIENT_CLINIC_OR_DEPARTMENT_OTHER): Payer: 59 | Admitting: Oncology

## 2023-09-11 ENCOUNTER — Inpatient Hospital Stay: Payer: 59 | Attending: Oncology

## 2023-09-11 ENCOUNTER — Other Ambulatory Visit: Payer: Self-pay

## 2023-09-11 ENCOUNTER — Encounter: Payer: Self-pay | Admitting: Oncology

## 2023-09-11 VITALS — BP 118/72 | HR 84 | Temp 98.6°F | Resp 19

## 2023-09-11 VITALS — BP 120/81 | HR 98 | Temp 97.7°F | Resp 18 | Ht 67.0 in | Wt 215.8 lb

## 2023-09-11 DIAGNOSIS — Z90722 Acquired absence of ovaries, bilateral: Secondary | ICD-10-CM | POA: Diagnosis not present

## 2023-09-11 DIAGNOSIS — C50811 Malignant neoplasm of overlapping sites of right female breast: Secondary | ICD-10-CM | POA: Insufficient documentation

## 2023-09-11 DIAGNOSIS — Z803 Family history of malignant neoplasm of breast: Secondary | ICD-10-CM | POA: Diagnosis not present

## 2023-09-11 DIAGNOSIS — G620 Drug-induced polyneuropathy: Secondary | ICD-10-CM | POA: Insufficient documentation

## 2023-09-11 DIAGNOSIS — Z79811 Long term (current) use of aromatase inhibitors: Secondary | ICD-10-CM

## 2023-09-11 DIAGNOSIS — R945 Abnormal results of liver function studies: Secondary | ICD-10-CM

## 2023-09-11 DIAGNOSIS — Z170 Estrogen receptor positive status [ER+]: Secondary | ICD-10-CM

## 2023-09-11 DIAGNOSIS — Z79899 Other long term (current) drug therapy: Secondary | ICD-10-CM | POA: Diagnosis not present

## 2023-09-11 DIAGNOSIS — Z79818 Long term (current) use of other agents affecting estrogen receptors and estrogen levels: Secondary | ICD-10-CM | POA: Insufficient documentation

## 2023-09-11 DIAGNOSIS — C50511 Malignant neoplasm of lower-outer quadrant of right female breast: Secondary | ICD-10-CM

## 2023-09-11 DIAGNOSIS — Z1732 Human epidermal growth factor receptor 2 negative status: Secondary | ICD-10-CM | POA: Diagnosis not present

## 2023-09-11 DIAGNOSIS — Z9221 Personal history of antineoplastic chemotherapy: Secondary | ICD-10-CM | POA: Insufficient documentation

## 2023-09-11 DIAGNOSIS — Z5181 Encounter for therapeutic drug level monitoring: Secondary | ICD-10-CM

## 2023-09-11 DIAGNOSIS — Z9013 Acquired absence of bilateral breasts and nipples: Secondary | ICD-10-CM | POA: Diagnosis not present

## 2023-09-11 DIAGNOSIS — Z08 Encounter for follow-up examination after completed treatment for malignant neoplasm: Secondary | ICD-10-CM

## 2023-09-11 DIAGNOSIS — R7989 Other specified abnormal findings of blood chemistry: Secondary | ICD-10-CM

## 2023-09-11 LAB — CBC WITH DIFFERENTIAL (CANCER CENTER ONLY)
Abs Immature Granulocytes: 0.05 10*3/uL (ref 0.00–0.07)
Basophils Absolute: 0 10*3/uL (ref 0.0–0.1)
Basophils Relative: 0 %
Eosinophils Absolute: 0.1 10*3/uL (ref 0.0–0.5)
Eosinophils Relative: 2 %
HCT: 41.4 % (ref 36.0–46.0)
Hemoglobin: 13.9 g/dL (ref 12.0–15.0)
Immature Granulocytes: 1 %
Lymphocytes Relative: 39 %
Lymphs Abs: 2.6 10*3/uL (ref 0.7–4.0)
MCH: 29.7 pg (ref 26.0–34.0)
MCHC: 33.6 g/dL (ref 30.0–36.0)
MCV: 88.5 fL (ref 80.0–100.0)
Monocytes Absolute: 0.4 10*3/uL (ref 0.1–1.0)
Monocytes Relative: 6 %
Neutro Abs: 3.5 10*3/uL (ref 1.7–7.7)
Neutrophils Relative %: 52 %
Platelet Count: 270 10*3/uL (ref 150–400)
RBC: 4.68 MIL/uL (ref 3.87–5.11)
RDW: 12.4 % (ref 11.5–15.5)
WBC Count: 6.7 10*3/uL (ref 4.0–10.5)
nRBC: 0 % (ref 0.0–0.2)

## 2023-09-11 LAB — CMP (CANCER CENTER ONLY)
ALT: 60 U/L — ABNORMAL HIGH (ref 0–44)
AST: 54 U/L — ABNORMAL HIGH (ref 15–41)
Albumin: 4.2 g/dL (ref 3.5–5.0)
Alkaline Phosphatase: 71 U/L (ref 38–126)
Anion gap: 12 (ref 5–15)
BUN: 11 mg/dL (ref 6–20)
CO2: 28 mmol/L (ref 22–32)
Calcium: 9.7 mg/dL (ref 8.9–10.3)
Chloride: 99 mmol/L (ref 98–111)
Creatinine: 0.97 mg/dL (ref 0.44–1.00)
GFR, Estimated: >60 mL/min (ref >60)
Glucose, Bld: 85 mg/dL (ref 70–99)
Potassium: 3.6 mmol/L (ref 3.5–5.1)
Sodium: 139 mmol/L (ref 135–145)
Total Bilirubin: 0.6 mg/dL (ref <1.2)
Total Protein: 7.2 g/dL (ref 6.5–8.1)

## 2023-09-11 MED ORDER — SODIUM CHLORIDE 0.9 % IV SOLN
INTRAVENOUS | Status: DC
  Filled 2023-09-11: qty 250

## 2023-09-11 MED ORDER — ZOLEDRONIC ACID 4 MG/100ML IV SOLN
4.0000 mg | Freq: Once | INTRAVENOUS | Status: AC
  Administered 2023-09-11: 4 mg via INTRAVENOUS
  Filled 2023-09-11: qty 100

## 2023-09-12 ENCOUNTER — Encounter: Payer: Self-pay | Admitting: Oncology

## 2023-09-12 MED ORDER — ESCITALOPRAM OXALATE 10 MG PO TABS: 10.0000 mg | ORAL_TABLET | Freq: Every day | ORAL | Status: DC
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
## 2023-09-18 ENCOUNTER — Ambulatory Visit (INDEPENDENT_AMBULATORY_CARE_PROVIDER_SITE_OTHER): Payer: 59 | Admitting: Physician Assistant

## 2023-09-18 DIAGNOSIS — C50411 Malignant neoplasm of upper-outer quadrant of right female breast: Secondary | ICD-10-CM

## 2023-09-18 DIAGNOSIS — Z9013 Acquired absence of bilateral breasts and nipples: Secondary | ICD-10-CM

## 2023-09-18 DIAGNOSIS — Z170 Estrogen receptor positive status [ER+]: Secondary | ICD-10-CM

## 2023-09-18 NOTE — Progress Notes (Signed)
Patient is a pleasant 42 year old female with history of breast cancer s/p bilateral reconstruction who has been receiving nipple areolar tattoo restoration, most recently 08/18/2023.  Today she joins via telephone to discuss her most recent tattooing that was performed 4 weeks ago.  Ink selection was changed and fill was performed to both the areolas and nipples.  The patient was at work and this provider was calling from their office.  A total of 5 minutes was spent speaking with the patient and reviewing chart.    She tells that she is quite pleased with the outcome from most recent NAC tattooing and that it did not fade nearly as much as she would have anticipated.  Given how it looks, she is okay with waiting 6 months and then circling back to reevaluate if she would like additional shading or any other changes.  However, she currently is quite pleased with how her nipple areolar restoration tattooing has concluded.  At this point, follow-up as needed.  She will share a Building services engineer via MyChart.

## 2023-10-06 ENCOUNTER — Inpatient Hospital Stay: Payer: 59

## 2023-10-19 ENCOUNTER — Encounter: Payer: Self-pay | Admitting: Oncology

## 2023-11-07 ENCOUNTER — Other Ambulatory Visit: Payer: Self-pay

## 2023-11-07 ENCOUNTER — Encounter: Payer: Self-pay | Admitting: Emergency Medicine

## 2023-11-07 ENCOUNTER — Emergency Department
Admission: EM | Admit: 2023-11-07 | Discharge: 2023-11-07 | Disposition: A | Discharge status: Home / Self Care | Payer: 59 | Attending: Emergency Medicine | Admitting: Emergency Medicine

## 2023-11-07 DIAGNOSIS — K2970 Gastritis, unspecified, without bleeding: Secondary | ICD-10-CM | POA: Diagnosis not present

## 2023-11-07 DIAGNOSIS — R109 Unspecified abdominal pain: Secondary | ICD-10-CM | POA: Diagnosis present

## 2023-11-07 LAB — LIPASE, BLOOD: Lipase: 21 U/L (ref 11–51)

## 2023-11-07 LAB — CBC
HCT: 40.5 % (ref 36.0–46.0)
Hemoglobin: 14.1 g/dL (ref 12.0–15.0)
MCH: 30.7 pg (ref 26.0–34.0)
MCHC: 34.8 g/dL (ref 30.0–36.0)
MCV: 88 fL (ref 80.0–100.0)
Platelets: 265 10*3/uL (ref 150–400)
RBC: 4.6 MIL/uL (ref 3.87–5.11)
RDW: 12.1 % (ref 11.5–15.5)
WBC: 6 10*3/uL (ref 4.0–10.5)
nRBC: 0 % (ref 0.0–0.2)

## 2023-11-07 LAB — COMPREHENSIVE METABOLIC PANEL
ALT: 41 U/L (ref 0–44)
AST: 35 U/L (ref 15–41)
Albumin: 4.2 g/dL (ref 3.5–5.0)
Alkaline Phosphatase: 77 U/L (ref 38–126)
Anion gap: 9 (ref 5–15)
BUN: 14 mg/dL (ref 6–20)
CO2: 24 mmol/L (ref 22–32)
Calcium: 8.8 mg/dL — ABNORMAL LOW (ref 8.9–10.3)
Chloride: 107 mmol/L (ref 98–111)
Creatinine, Ser: 0.83 mg/dL (ref 0.44–1.00)
GFR, Estimated: >60 mL/min (ref >60)
Glucose, Bld: 105 mg/dL — ABNORMAL HIGH (ref 70–99)
Potassium: 4 mmol/L (ref 3.5–5.1)
Sodium: 140 mmol/L (ref 135–145)
Total Bilirubin: 1.1 mg/dL (ref 0.0–1.2)
Total Protein: 7.1 g/dL (ref 6.5–8.1)

## 2023-11-07 MED ORDER — SUCRALFATE 1 G PO TABS: 1.0000 g | ORAL_TABLET | Freq: Four times a day (QID) | ORAL | 0 refills | Status: DC

## 2023-11-07 MED ORDER — LIDOCAINE VISCOUS HCL 2 % MT SOLN
15.0000 mL | Freq: Once | OROMUCOSAL | Status: AC
  Administered 2023-11-07: 15 mL via ORAL
  Filled 2023-11-07: qty 15

## 2023-11-07 MED ORDER — PANTOPRAZOLE SODIUM 40 MG PO TBEC: 40.0000 mg | DELAYED_RELEASE_TABLET | Freq: Every day | ORAL | 1 refills | Status: AC

## 2023-11-07 MED ORDER — ALUM & MAG HYDROXIDE-SIMETH 200-200-20 MG/5ML PO SUSP
30.0000 mL | Freq: Once | ORAL | Status: AC
Start: 2023-11-07 — End: 2023-11-07
  Administered 2023-11-07: 30 mL via ORAL
  Filled 2023-11-07: qty 30

## 2023-11-07 NOTE — ED Provider Notes (Signed)
[facility] Provider Note    Event Date/Time   First MD Initiated Contact with Patient 11/07/23 [phone]     (approximate)   History   Abdominal Pain   HPI  [name] is a 43 y.o. female who presents to the emergency department today because of concerns for abdominal pain.  The pain is located on the left side of her abdomen.  She describes it as sharp.  It started yesterday.  It did not start after she ate.  She has had some nausea and chills with this.  Denies similar pain in the past.  Denies any bloody stool.  Denies any unusual ingestions.     Physical Exam   Triage Vital Signs: ED Triage Vitals  Encounter Vitals Group     BP 11/07/23 0944 121/83     Systolic BP Percentile --      Diastolic BP Percentile --      Pulse Rate 11/07/23 0944 90     Resp 11/07/23 0944 14     Temp 11/07/23 0944 98.9 F (37.2 C)     Temp Source 11/07/23 0944 Oral     SpO2 11/07/23 0944 99 %     Weight 11/07/23 0928 223 lb (101.2 kg)     Height 11/07/23 0928 5\' 7"  (1.702 m)     Head Circumference --      Peak Flow --      Pain Score 11/07/23 0928 8     Pain Loc --      Pain Education --      Exclude from Growth Chart --     Most recent vital signs: Vitals:   11/07/23 0944  BP: 121/83  Pulse: 90  Resp: 14  Temp: 98.9 F (37.2 C)  SpO2: 99%   General: Awake, alert, oriented. CV:  Good peripheral perfusion. Regular rate and rhythm. Resp:  Normal effort. Lungs clear. Abd:  No distention. Tender to palpation in the left upper quadrant.   ED Results / Procedures / Treatments   Labs (all labs ordered are listed, but only abnormal results are displayed) Labs Reviewed  COMPREHENSIVE METABOLIC PANEL - Abnormal; Notable for the following components:      Result Value   Glucose, Bld 105 (*)    Calcium 8.8 (*)    All other components within normal limits  LIPASE, BLOOD  CBC  URINALYSIS, ROUTINE W REFLEX MICROSCOPIC      EKG  None   RADIOLOGY None   PROCEDURES:  Critical Care performed: None    MEDICATIONS ORDERED IN ED: Medications - No data to display   IMPRESSION / MDM / ASSESSMENT AND PLAN / ED COURSE  I reviewed the triage vital signs and the nursing notes.                              Differential diagnosis includes, but is not limited to, pancreatitis, gastritis, hepatitis  Patient's presentation is most consistent with acute presentation with potential threat to life or bodily function.   Patient presented to the emerged today because of concerns for abdominal pain.  On exam patient is somewhat tender in the left upper abdomen.  Blood work without concerning leukocytosis patient is afebrile.  She did get some improvement after GI cocktail.  I did discuss with patient possibility of gastritis.  Did discuss imaging however patient is comfortable deferring and I think that is completely reasonable given reassuring  blood work.  Will plan on discharging with antacid and sucralfate.  Discussed return precautions.      FINAL CLINICAL IMPRESSION(S) / ED DIAGNOSES   Final diagnoses:  Abdominal pain, unspecified abdominal location  Gastritis, presence of bleeding unspecified, unspecified chronicity, unspecified gastritis type   Note:  This document was prepared using Dragon voice recognition software and may include unintentional dictation errors.    Phineas Semen, MD 11/07/23 [phone]

## 2023-11-07 NOTE — ED Triage Notes (Signed)
Patient to ED via POV fro generalized abd pain. Also having nausea and diarrhea. Ongoing since yesterday.

## 2023-11-07 NOTE — Discharge Instructions (Signed)
Please seek medical attention for any high fevers, chest pain, shortness of breath, change in behavior, persistent vomiting, bloody stool or any other new or concerning symptoms.  

## 2023-11-23 ENCOUNTER — Encounter: Payer: Self-pay | Admitting: Oncology

## 2023-11-30 ENCOUNTER — Ambulatory Visit
Admission: RE | Admit: 2023-11-30 | Discharge: 2023-11-30 | Disposition: A | Discharge status: Home / Self Care | Payer: Medicaid Other | Source: Ambulatory Visit | Attending: Oncology | Admitting: Oncology

## 2023-11-30 DIAGNOSIS — Z853 Personal history of malignant neoplasm of breast: Secondary | ICD-10-CM | POA: Diagnosis present

## 2023-11-30 DIAGNOSIS — Z08 Encounter for follow-up examination after completed treatment for malignant neoplasm: Secondary | ICD-10-CM | POA: Diagnosis present

## 2023-12-08 ENCOUNTER — Other Ambulatory Visit: Payer: Self-pay

## 2023-12-11 ENCOUNTER — Other Ambulatory Visit: Payer: Self-pay | Admitting: Oncology

## 2023-12-12 ENCOUNTER — Encounter: Payer: Self-pay | Admitting: Oncology

## 2023-12-12 ENCOUNTER — Encounter: Payer: Self-pay | Admitting: Gastroenterology

## 2023-12-12 ENCOUNTER — Other Ambulatory Visit: Payer: Self-pay

## 2023-12-12 ENCOUNTER — Ambulatory Visit (INDEPENDENT_AMBULATORY_CARE_PROVIDER_SITE_OTHER): Payer: Medicaid Other | Admitting: Gastroenterology

## 2023-12-12 VITALS — BP 115/79 | HR 93 | Temp 98.0°F | Ht 67.0 in | Wt 225.4 lb

## 2023-12-12 DIAGNOSIS — E669 Obesity, unspecified: Secondary | ICD-10-CM

## 2023-12-12 DIAGNOSIS — K8689 Other specified diseases of pancreas: Secondary | ICD-10-CM

## 2023-12-12 DIAGNOSIS — R7401 Elevation of levels of liver transaminase levels: Secondary | ICD-10-CM | POA: Diagnosis not present

## 2023-12-12 DIAGNOSIS — K760 Fatty (change of) liver, not elsewhere classified: Secondary | ICD-10-CM | POA: Insufficient documentation

## 2023-12-12 DIAGNOSIS — Z6835 Body mass index (BMI) 35.0-35.9, adult: Secondary | ICD-10-CM | POA: Diagnosis not present

## 2023-12-12 DIAGNOSIS — Z800 Family history of malignant neoplasm of digestive organs: Secondary | ICD-10-CM

## 2023-12-12 DIAGNOSIS — R7989 Other specified abnormal findings of blood chemistry: Secondary | ICD-10-CM

## 2023-12-12 MED ORDER — PREGABALIN 75 MG PO CAPS: ORAL_CAPSULE | ORAL | 2 refills | Status: DC

## 2023-12-12 MED ORDER — NA SULFATE-K SULFATE-MG SULF 17.5-3.13-1.6 GM/177ML PO SOLN: 354.0000 mL | Freq: Once | ORAL | 0 refills | Status: AC
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
## 2023-12-13 ENCOUNTER — Encounter: Payer: Self-pay | Admitting: Gastroenterology

## 2023-12-13 NOTE — Anesthesia Preprocedure Evaluation (Addendum)
 Anesthesia Evaluation  Patient identified by MRN, date of birth, ID band Patient awake    Reviewed: Allergy & Precautions, H&P , NPO status , Patient's Chart, lab work & pertinent test results  History of Anesthesia Complications (+) Family history of anesthesia reaction and history of anesthetic complications  Airway Mallampati: II  TM Distance: >3 FB Neck ROM: Full    Dental no notable dental hx.  Permanent bridge behind top front teeth:   Pulmonary neg pulmonary ROS, asthma    Pulmonary exam normal breath sounds clear to auscultation       Cardiovascular hypertension, Normal cardiovascular exam Rhythm:Regular Rate:Normal     Neuro/Psych  Headaches, Seizures -,  PSYCHIATRIC DISORDERS Anxiety Depression     Neuromuscular disease negative neurological ROS  negative psych ROS   GI/Hepatic negative GI ROS, Neg liver ROS,GERD  ,,  Endo/Other  negative endocrine ROS    Renal/GU Renal diseasenegative Renal ROS  negative genitourinary   Musculoskeletal negative musculoskeletal ROS (+) Arthritis ,    Abdominal   Peds negative pediatric ROS (+)  Hematology negative hematology ROS (+) Blood dyscrasia, anemia   Anesthesia Other Findings Depression  Anxiety Chronic back pain Family history of colon cancer  History of kidney stones  Seizure (HCC) GERD (gastroesophageal reflux disease) Asthma Atrophic pancreas  Insomnia Breast cancer (HCC) Anemia Hypertension Incisional hernia Umbilical hernia  Headache Malignant neoplasm of upper-outer quadrant of right breast in female, estrogen receptor positive (HCC)  Complication of anesthesia--wakes during procedures Family history of adverse reaction to anesthesia--wakes up during procedures  Sciatica of left side Wears contact lenses  Fatty liver  Very anxious   Reproductive/Obstetrics negative OB ROS                             Anesthesia  Physical Anesthesia Plan  ASA: 3  Anesthesia Plan: General   Post-op Pain Management:    Induction: Intravenous  PONV Risk Score and Plan:   Airway Management Planned: Natural Airway and Nasal Cannula  Additional Equipment:   Intra-op Plan:   Post-operative Plan:   Informed Consent: I have reviewed the patients History and Physical, chart, labs and discussed the procedure including the risks, benefits and alternatives for the proposed anesthesia with the patient or authorized representative who has indicated his/her understanding and acceptance.     Dental Advisory Given  Plan Discussed with: Anesthesiologist, CRNA and Surgeon  Anesthesia Plan Comments: (Patient consented for risks of anesthesia including but not limited to:  - adverse reactions to medications - risk of airway placement if required - damage to eyes, teeth, lips or other oral mucosa - nerve damage due to positioning  - sore throat or hoarseness - Damage to heart, brain, nerves, lungs, other parts of body or loss of life  Patient voiced understanding and assent.)        Anesthesia Quick Evaluation

## 2023-12-18 ENCOUNTER — Other Ambulatory Visit: Payer: Self-pay

## 2023-12-18 ENCOUNTER — Encounter: Payer: Self-pay | Admitting: Gastroenterology

## 2023-12-18 ENCOUNTER — Encounter
Admission: RE | Disposition: A | Discharge status: Home / Self Care | Payer: Self-pay | Source: Home / Self Care | Attending: Gastroenterology

## 2023-12-18 ENCOUNTER — Ambulatory Visit
Admission: RE | Admit: 2023-12-18 | Discharge: 2023-12-18 | Disposition: A | Discharge status: Home / Self Care | Payer: Medicaid Other | Attending: Gastroenterology | Admitting: Gastroenterology

## 2023-12-18 ENCOUNTER — Ambulatory Visit: Payer: Self-pay | Admitting: Anesthesiology

## 2023-12-18 DIAGNOSIS — M1990 Unspecified osteoarthritis, unspecified site: Secondary | ICD-10-CM | POA: Insufficient documentation

## 2023-12-18 DIAGNOSIS — J45909 Unspecified asthma, uncomplicated: Secondary | ICD-10-CM | POA: Diagnosis not present

## 2023-12-18 DIAGNOSIS — Z800 Family history of malignant neoplasm of digestive organs: Secondary | ICD-10-CM | POA: Diagnosis not present

## 2023-12-18 DIAGNOSIS — Z87891 Personal history of nicotine dependence: Secondary | ICD-10-CM | POA: Diagnosis not present

## 2023-12-18 DIAGNOSIS — Z8669 Personal history of other diseases of the nervous system and sense organs: Secondary | ICD-10-CM | POA: Diagnosis not present

## 2023-12-18 DIAGNOSIS — K219 Gastro-esophageal reflux disease without esophagitis: Secondary | ICD-10-CM | POA: Diagnosis not present

## 2023-12-18 DIAGNOSIS — I10 Essential (primary) hypertension: Secondary | ICD-10-CM | POA: Insufficient documentation

## 2023-12-18 DIAGNOSIS — Z1211 Encounter for screening for malignant neoplasm of colon: Secondary | ICD-10-CM | POA: Insufficient documentation

## 2023-12-18 DIAGNOSIS — Z8379 Family history of other diseases of the digestive system: Secondary | ICD-10-CM | POA: Diagnosis not present

## 2023-12-18 HISTORY — DX: Family history of other specified conditions: Z84.89

## 2023-12-18 HISTORY — DX: Other complications of anesthesia, initial encounter: T88.59XA

## 2023-12-18 HISTORY — DX: Fatty (change of) liver, not elsewhere classified: K76.0

## 2023-12-18 HISTORY — DX: Sciatica, left side: M54.32

## 2023-12-18 HISTORY — DX: Presence of spectacles and contact lenses: Z97.3

## 2023-12-18 SURGERY — COLONOSCOPY WITH PROPOFOL
Anesthesia: General | Site: Rectum

## 2023-12-18 MED ORDER — PROPOFOL 10 MG/ML IV BOLUS
INTRAVENOUS | Status: DC | PRN
  Administered 2023-12-18: 40 mg via INTRAVENOUS
  Administered 2023-12-18: 30 mg via INTRAVENOUS
  Administered 2023-12-18: 50 mg via INTRAVENOUS
  Administered 2023-12-18: 100 mg via INTRAVENOUS

## 2023-12-18 MED ORDER — SODIUM CHLORIDE 0.9 % IV SOLN: INTRAVENOUS | Status: DC

## 2023-12-18 MED ORDER — LACTATED RINGERS IV SOLN
INTRAVENOUS | Status: DC
Start: 2023-12-18 — End: 2023-12-18

## 2023-12-18 MED ORDER — LIDOCAINE HCL (CARDIAC) PF 100 MG/5ML IV SOSY
PREFILLED_SYRINGE | INTRAVENOUS | Status: DC | PRN
  Administered 2023-12-18: 80 mg via INTRAVENOUS

## 2023-12-18 MED ORDER — STERILE WATER FOR IRRIGATION IR SOLN
Status: DC | PRN
  Administered 2023-12-18: 1

## 2023-12-18 SURGICAL SUPPLY — 21 items
CLIP HMST 235XBRD CATH ROT (MISCELLANEOUS) IMPLANT
ELECT REM PT RETURN 9FT ADLT (ELECTROSURGICAL) IMPLANT
ELECTRODE REM PT RTRN 9FT ADLT (ELECTROSURGICAL) IMPLANT
FCP ESCP3.2XJMB 240X2.8X (MISCELLANEOUS) IMPLANT
FORCEPS BIOP RAD 4 LRG CAP 4 (CUTTING FORCEPS) IMPLANT
FORCEPS ESCP3.2XJMB 240X2.8X (MISCELLANEOUS) IMPLANT
GOWN CVR UNV OPN BCK APRN NK (MISCELLANEOUS) ×2 IMPLANT
INJECTOR VARIJECT VIN23 (MISCELLANEOUS) IMPLANT
KIT DEFENDO VALVE AND CONN (KITS) IMPLANT
KIT PRC NS LF DISP ENDO (KITS) ×1 IMPLANT
MANIFOLD NEPTUNE II (INSTRUMENTS) ×1 IMPLANT
MARKER SPOT ENDO TATTOO 5ML (MISCELLANEOUS) IMPLANT
PROBE APC STR FIRE (PROBE) IMPLANT
RETRIEVER NET ROTH 2.5X230 LF (MISCELLANEOUS) IMPLANT
SNARE COLD EXACTO (MISCELLANEOUS) IMPLANT
SNARE SHORT THROW 13M SML OVAL (MISCELLANEOUS) IMPLANT
SNARE SHORT THROW 30M LRG OVAL (MISCELLANEOUS) IMPLANT
SNARE SNG USE RND 15MM (INSTRUMENTS) IMPLANT
TRAP ETRAP POLY (MISCELLANEOUS) IMPLANT
VARIJECT INJECTOR VIN23 (MISCELLANEOUS) IMPLANT
WATER STERILE IRR 250ML POUR (IV SOLUTION) ×1 IMPLANT
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
## 2023-12-18 NOTE — Anesthesia Postprocedure Evaluation (Signed)
 Anesthesia Post Note  Patient: [name]  Procedure(s) Performed: COLONOSCOPY WITH PROPOFOL (Rectum)  Patient location during evaluation: PACU Anesthesia Type: General Level of consciousness: awake and alert Pain management: pain level controlled Vital Signs Assessment: post-procedure vital signs reviewed and stable Respiratory status: spontaneous breathing, nonlabored ventilation, respiratory function stable and patient connected to nasal cannula oxygen Cardiovascular status: blood pressure returned to baseline and stable Postop Assessment: no apparent nausea or vomiting Anesthetic complications: no   No notable events documented.   Last Vitals:  Vitals:   12/18/23 1025 12/18/23 1030  BP: 109/74 107/78  Pulse: 93 86  Resp: 18 (!) 25  Temp: 36.9 C   SpO2: 96% 99%    Last Pain:  Vitals:   12/18/23 1025  TempSrc:   PainSc: 0-No pain                 Loxley Cibrian C Susan Arana

## 2023-12-18 NOTE — Transfer of Care (Signed)
 Immediate Anesthesia Transfer of Care Note  Patient: [name]  Procedure(s) Performed: COLONOSCOPY WITH PROPOFOL (Rectum)  Patient Location: PACU  Anesthesia Type: General  Level of Consciousness: awake, alert  and patient cooperative  Airway and Oxygen Therapy: Patient Spontanous Breathing and Patient connected to supplemental oxygen  Post-op Assessment: Post-op Vital signs reviewed, Patient's Cardiovascular Status Stable, Respiratory Function Stable, Patent Airway and No signs of Nausea or vomiting  Post-op Vital Signs: Reviewed and stable  Complications: No notable events documented.

## 2023-12-18 NOTE — Op Note (Signed)
 [facility] Patient Name: [name] Procedure Date: 12/18/2023 9:56 AM MRN: [id number] Account #: [id number] Date of Birth: November 13, 1980 Admit Type: Outpatient Age: 43 Room: [facility] OR ROOM 01 Gender: Female Note Status: Finalized Instrument Name: [id number] Procedure:             Colonoscopy Indications:           Screening for colon cancer: Family history of                         colorectal cancer in multiple 2nd degree relatives,                         This is the patient's first colonoscopy Providers:             Toney Reil MD, MD Referring MD:          Toney Reil MD, MD (Referring MD), [facility]                         [facility] (Referring MD) Medicines:             General Anesthesia Complications:         No immediate complications. Estimated blood loss: None. Procedure:             Pre-Anesthesia Assessment:                        - Prior to the procedure, a History and Physical was                         performed, and patient medications and allergies were                         reviewed. The patient is competent. The risks and                         benefits of the procedure and the sedation options and                         risks were discussed with the patient. All questions                         were answered and informed consent was obtained.                         Patient identification and proposed procedure were                         verified by the physician, the nurse, the                         anesthesiologist, the anesthetist and the technician                         in the pre-procedure area in the procedure room in the                         endoscopy suite. Mental Status Examination: alert and  oriented. Airway Examination: normal oropharyngeal                         airway and neck mobility. Respiratory Examination:                         clear to auscultation. CV  Examination: normal.                         Prophylactic Antibiotics: The patient does not require                         prophylactic antibiotics. Prior Anticoagulants: The                         patient has taken no anticoagulant or antiplatelet                         agents. ASA Grade Assessment: III - A patient with                         severe systemic disease. After reviewing the risks and                         benefits, the patient was deemed in satisfactory                         condition to undergo the procedure. The anesthesia                         plan was to use general anesthesia. Immediately prior                         to administration of medications, the patient was                         re-assessed for adequacy to receive sedatives. The                         heart rate, respiratory rate, oxygen saturations,                         blood pressure, adequacy of pulmonary ventilation, and                         response to care were monitored throughout the                         procedure. The physical status of the patient was                         re-assessed after the procedure.                        After obtaining informed consent, the colonoscope was                         passed under direct vision. Throughout the procedure,  the patient's blood pressure, pulse, and oxygen                         saturations were monitored continuously. The                         Colonoscope was introduced through the anus and                         advanced to the the terminal ileum, with                         identification of the appendiceal orifice and IC                         valve. The colonoscopy was performed without                         difficulty. The patient tolerated the procedure well.                         The quality of the bowel preparation was evaluated                         using the BBPS [facility] Bowel  Preparation Scale) with                         scores of: Right Colon = 3, Transverse Colon = 3 and                         Left Colon = 3 (entire mucosa seen well with no                         residual staining, small fragments of stool or opaque                         liquid). The total BBPS score equals 9. The terminal                         ileum, ileocecal valve, appendiceal orifice, and                         rectum were photographed. Findings:      The perianal and digital rectal examinations were normal. Pertinent       negatives include normal sphincter tone and no palpable rectal lesions.      The terminal ileum appeared normal.      The entire examined colon appeared normal.      The retroflexed view of the distal rectum and anal verge was normal and       showed no anal or rectal abnormalities. Impression:            - The examined portion of the ileum was normal.                        - The entire examined colon is normal.                        -  The distal rectum and anal verge are normal on                         retroflexion view.                        - No specimens collected. Recommendation:        - Discharge patient to home (with parent).                        - Resume previous diet today.                        - Continue present medications.                        - Repeat colonoscopy in 5 years for surveillance. Procedure Code(s):     --- Professional ---                        Z6109, Colorectal cancer screening; colonoscopy on                         individual not meeting criteria for high risk Diagnosis Code(s):     --- Professional ---                        Z80.0, Family history of malignant neoplasm of                         digestive organs CPT copyright 2022 American Medical Association. All rights reserved. The codes documented in this report are preliminary and upon coder review may  be revised to meet current compliance requirements. Dr.  Libby Maw Toney Reil MD, MD 12/18/2023 10:18:40 AM This report has been signed electronically. Number of Addenda: 0 Note Initiated On: 12/18/2023 9:56 AM Scope Withdrawal Time: 0 hours 7 minutes 12 seconds  Total Procedure Duration: 0 hours 9 minutes 52 seconds  Estimated Blood Loss:  Estimated blood loss: none.      [facility]

## 2023-12-19 ENCOUNTER — Encounter: Payer: Self-pay | Admitting: Gastroenterology

## 2023-12-20 LAB — MITOCHONDRIAL ANTIBODIES: Mitochondrial Ab: <20 U (ref 0.0–20.0)

## 2023-12-20 LAB — ALPHA-1-ANTITRYPSIN: A-1 Antitrypsin: 149 mg/dL (ref 101–187)

## 2023-12-20 LAB — CERULOPLASMIN: Ceruloplasmin: 24.4 mg/dL (ref 19.0–39.0)

## 2023-12-20 LAB — CELIAC DISEASE PANEL
Endomysial IgA: NEGATIVE
IgA/Immunoglobulin A, Serum: 129 mg/dL (ref 87–352)
Transglutaminase IgA: <2 U/mL (ref 0–3)

## 2023-12-20 LAB — ANTI-SMOOTH MUSCLE ANTIBODY, IGG: Smooth Muscle Ab: 5 U (ref 0–19)

## 2023-12-20 LAB — ANA: ANA Titer 1: NEGATIVE

## 2024-01-04 ENCOUNTER — Encounter: Payer: Self-pay | Admitting: Oncology

## 2024-01-22 ENCOUNTER — Encounter: Payer: Self-pay | Admitting: Oncology
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
## 2024-01-23 ENCOUNTER — Ambulatory Visit (INDEPENDENT_AMBULATORY_CARE_PROVIDER_SITE_OTHER): Admitting: Internal Medicine

## 2024-01-23 VITALS — BP 130/95 | HR 108 | Resp 16 | Ht 67.0 in | Wt 224.0 lb

## 2024-01-23 DIAGNOSIS — F419 Anxiety disorder, unspecified: Secondary | ICD-10-CM | POA: Diagnosis not present

## 2024-01-23 DIAGNOSIS — E669 Obesity, unspecified: Secondary | ICD-10-CM

## 2024-01-23 DIAGNOSIS — G4710 Hypersomnia, unspecified: Secondary | ICD-10-CM

## 2024-01-23 DIAGNOSIS — F32A Depression, unspecified: Secondary | ICD-10-CM

## 2024-01-23 DIAGNOSIS — I10 Essential (primary) hypertension: Secondary | ICD-10-CM

## 2024-01-23 DIAGNOSIS — G2581 Restless legs syndrome: Secondary | ICD-10-CM

## 2024-01-23 DIAGNOSIS — J4520 Mild intermittent asthma, uncomplicated: Secondary | ICD-10-CM

## 2024-02-01 ENCOUNTER — Other Ambulatory Visit: Payer: Self-pay | Admitting: Gastroenterology

## 2024-03-12 ENCOUNTER — Inpatient Hospital Stay: Payer: 59 | Attending: Oncology | Admitting: Oncology

## 2024-03-12 ENCOUNTER — Encounter: Payer: Self-pay | Admitting: Oncology

## 2024-03-12 VITALS — BP 129/68 | HR 92 | Temp 97.8°F | Resp 12 | Ht 67.0 in

## 2024-03-12 DIAGNOSIS — Z170 Estrogen receptor positive status [ER+]: Secondary | ICD-10-CM | POA: Insufficient documentation

## 2024-03-12 DIAGNOSIS — Z79811 Long term (current) use of aromatase inhibitors: Secondary | ICD-10-CM | POA: Diagnosis not present

## 2024-03-12 DIAGNOSIS — Z9011 Acquired absence of right breast and nipple: Secondary | ICD-10-CM | POA: Insufficient documentation

## 2024-03-12 DIAGNOSIS — Z853 Personal history of malignant neoplasm of breast: Secondary | ICD-10-CM | POA: Diagnosis not present

## 2024-03-12 DIAGNOSIS — Z79899 Other long term (current) drug therapy: Secondary | ICD-10-CM

## 2024-03-12 DIAGNOSIS — Z08 Encounter for follow-up examination after completed treatment for malignant neoplasm: Secondary | ICD-10-CM

## 2024-03-12 DIAGNOSIS — C50511 Malignant neoplasm of lower-outer quadrant of right female breast: Secondary | ICD-10-CM | POA: Insufficient documentation

## 2024-03-12 DIAGNOSIS — R7989 Other specified abnormal findings of blood chemistry: Secondary | ICD-10-CM

## 2024-03-12 NOTE — Progress Notes (Signed)
 C/o nausea, burping and acid reflux, she has taken pantoprazole  and tums, not helping. Hasn't taken any compazine /phenergan  yet. Has been oout of work since last week.  C/o cough x3 weeks. Finished tessalon . Had virtual visit, given prednisone  and xyzal.  PCP wants her to see GI for liver biopsy. Labs done at [facility]. This is causing worry, her grandfather passed away with liver cancer.
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
## 2024-03-12 NOTE — Progress Notes (Signed)
 Per Dr. Randy Buttery "Refer the patient to [facility] Dr. Baldomero Bone".

## 2024-03-19 ENCOUNTER — Encounter: Payer: Self-pay | Admitting: Oncology

## 2024-03-22 ENCOUNTER — Other Ambulatory Visit: Payer: Self-pay | Admitting: Oncology

## 2024-03-22 ENCOUNTER — Ambulatory Visit

## 2024-03-22 ENCOUNTER — Ambulatory Visit: Payer: 59 | Admitting: Plastic Surgery

## 2024-03-22 DIAGNOSIS — Z79811 Long term (current) use of aromatase inhibitors: Secondary | ICD-10-CM

## 2024-03-22 DIAGNOSIS — Z08 Encounter for follow-up examination after completed treatment for malignant neoplasm: Secondary | ICD-10-CM

## 2024-03-22 DIAGNOSIS — R7989 Other specified abnormal findings of blood chemistry: Secondary | ICD-10-CM

## 2024-03-22 DIAGNOSIS — R110 Nausea: Secondary | ICD-10-CM

## 2024-03-22 DIAGNOSIS — Z79899 Other long term (current) drug therapy: Secondary | ICD-10-CM

## 2024-03-24 ENCOUNTER — Other Ambulatory Visit: Payer: Self-pay | Admitting: Oncology

## 2024-03-25 ENCOUNTER — Encounter: Payer: Self-pay | Admitting: Oncology

## 2024-03-25 ENCOUNTER — Ambulatory Visit: Admission: RE | Admit: 2024-03-25 | Source: Ambulatory Visit

## 2024-03-25 ENCOUNTER — Other Ambulatory Visit: Payer: Self-pay

## 2024-03-25 MED ORDER — PREGABALIN 75 MG PO CAPS: ORAL_CAPSULE | ORAL | 2 refills | Status: DC

## 2024-04-04 ENCOUNTER — Telehealth: Payer: Self-pay

## 2024-04-04 NOTE — Telephone Encounter (Signed)
 F/u n [facility] referral, Megan at [facility] stated that pt was sch'd to see Dr. Corky Diener 08/15/24 at 2:30. Pt has already been seen by [facility], last visit 03/19/24.

## 2024-04-12 ENCOUNTER — Encounter: Payer: Self-pay | Admitting: Oncology

## 2024-04-16 ENCOUNTER — Other Ambulatory Visit: Payer: Self-pay | Admitting: Oncology

## 2024-07-13 ENCOUNTER — Other Ambulatory Visit: Payer: Self-pay | Admitting: Oncology

## 2024-07-15 ENCOUNTER — Encounter: Payer: Self-pay | Admitting: Oncology

## 2024-08-09 ENCOUNTER — Encounter: Payer: Self-pay | Admitting: Oncology
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
## 2024-08-12 ENCOUNTER — Ambulatory Visit (INDEPENDENT_AMBULATORY_CARE_PROVIDER_SITE_OTHER): Admitting: Internal Medicine

## 2024-08-12 VITALS — BP 113/82 | HR 87 | Resp 16 | Ht 67.0 in | Wt 211.0 lb

## 2024-08-12 DIAGNOSIS — G4733 Obstructive sleep apnea (adult) (pediatric): Secondary | ICD-10-CM | POA: Insufficient documentation

## 2024-08-12 DIAGNOSIS — Z7189 Other specified counseling: Secondary | ICD-10-CM | POA: Insufficient documentation

## 2024-08-12 NOTE — Patient Instructions (Signed)

## 2024-09-11 ENCOUNTER — Ambulatory Visit: Admitting: Oncology

## 2024-09-23 ENCOUNTER — Telehealth: Payer: Self-pay | Admitting: Oncology

## 2024-09-23 NOTE — Telephone Encounter (Signed)
 Pt was scheduled for MD only on 12/10. Pt stated she does not have insurance right now but will have it in January. MD appt r/s to jan with a morning appt per pt request.

## 2024-09-25 ENCOUNTER — Inpatient Hospital Stay: Admitting: Oncology

## 2024-10-22 ENCOUNTER — Other Ambulatory Visit: Payer: Self-pay | Admitting: Oncology

## 2024-10-30 ENCOUNTER — Inpatient Hospital Stay: Admitting: Nurse Practitioner

## 2024-11-08 ENCOUNTER — Encounter: Payer: Self-pay | Admitting: Nurse Practitioner

## 2024-11-08 NOTE — Progress Notes (Signed)
 Patient complains of increasing neuropathy pain

## 2024-11-11 ENCOUNTER — Inpatient Hospital Stay: Attending: Nurse Practitioner | Admitting: Nurse Practitioner

## 2024-11-11 DIAGNOSIS — C50411 Malignant neoplasm of upper-outer quadrant of right female breast: Secondary | ICD-10-CM

## 2024-11-11 DIAGNOSIS — G629 Polyneuropathy, unspecified: Secondary | ICD-10-CM
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
## 2024-11-15 ENCOUNTER — Other Ambulatory Visit: Payer: Self-pay | Admitting: Nurse Practitioner

## 2024-11-15 ENCOUNTER — Ambulatory Visit
Admission: RE | Admit: 2024-11-15 | Discharge: 2024-11-15 | Disposition: A | Source: Ambulatory Visit | Attending: Nurse Practitioner

## 2024-11-15 ENCOUNTER — Encounter: Payer: Self-pay | Admitting: Oncology

## 2024-11-15 DIAGNOSIS — Z170 Estrogen receptor positive status [ER+]: Secondary | ICD-10-CM | POA: Insufficient documentation

## 2024-11-15 DIAGNOSIS — C50411 Malignant neoplasm of upper-outer quadrant of right female breast: Secondary | ICD-10-CM | POA: Insufficient documentation

## 2024-11-15 DIAGNOSIS — G629 Polyneuropathy, unspecified: Secondary | ICD-10-CM

## 2024-11-19 ENCOUNTER — Encounter: Payer: Self-pay | Admitting: Oncology

## 2024-12-09 ENCOUNTER — Inpatient Hospital Stay: Admitting: Oncology
